# Patient Record
Sex: Male | Born: 1960 | Race: White | Hispanic: No | Marital: Married | Smoking: Former smoker
Health system: Southern US, Community
[De-identification: ages and names within clinical notes are randomized; demographics above are authoritative.]

## PROBLEM LIST (undated history)

## (undated) DIAGNOSIS — K648 Other hemorrhoids: Principal | ICD-10-CM

## (undated) DIAGNOSIS — D126 Benign neoplasm of colon, unspecified: Secondary | ICD-10-CM

## (undated) DIAGNOSIS — G2581 Restless legs syndrome: Secondary | ICD-10-CM

## (undated) DIAGNOSIS — M109 Gout, unspecified: Secondary | ICD-10-CM

## (undated) DIAGNOSIS — N529 Male erectile dysfunction, unspecified: Secondary | ICD-10-CM

## (undated) DIAGNOSIS — R569 Unspecified convulsions: Secondary | ICD-10-CM

## (undated) DIAGNOSIS — K219 Gastro-esophageal reflux disease without esophagitis: Secondary | ICD-10-CM

## (undated) DIAGNOSIS — T7840XA Allergy, unspecified, initial encounter: Secondary | ICD-10-CM

## (undated) DIAGNOSIS — G4730 Sleep apnea, unspecified: Secondary | ICD-10-CM

## (undated) DIAGNOSIS — I10 Essential (primary) hypertension: Secondary | ICD-10-CM

## (undated) DIAGNOSIS — M5136 Other intervertebral disc degeneration, lumbar region: Secondary | ICD-10-CM

## (undated) DIAGNOSIS — F329 Major depressive disorder, single episode, unspecified: Secondary | ICD-10-CM

## (undated) DIAGNOSIS — R0602 Shortness of breath: Secondary | ICD-10-CM

## (undated) DIAGNOSIS — H269 Unspecified cataract: Secondary | ICD-10-CM

## (undated) DIAGNOSIS — F32A Depression, unspecified: Secondary | ICD-10-CM

## (undated) DIAGNOSIS — M51369 Other intervertebral disc degeneration, lumbar region without mention of lumbar back pain or lower extremity pain: Secondary | ICD-10-CM

## (undated) DIAGNOSIS — E119 Type 2 diabetes mellitus without complications: Secondary | ICD-10-CM

## (undated) DIAGNOSIS — F419 Anxiety disorder, unspecified: Secondary | ICD-10-CM

## (undated) HISTORY — DX: Unspecified cataract: H26.9

## (undated) HISTORY — DX: Type 2 diabetes mellitus without complications: E11.9

## (undated) HISTORY — PX: COLONOSCOPY: SHX174

## (undated) HISTORY — PX: OTHER SURGICAL HISTORY: SHX169

## (undated) HISTORY — DX: Unspecified convulsions: R56.9

## (undated) HISTORY — DX: Other hemorrhoids: K64.8

## (undated) HISTORY — DX: Male erectile dysfunction, unspecified: N52.9

## (undated) HISTORY — DX: Gastro-esophageal reflux disease without esophagitis: K21.9

## (undated) HISTORY — PX: EYE SURGERY: SHX253

## (undated) HISTORY — DX: Benign neoplasm of colon, unspecified: D12.6

## (undated) HISTORY — DX: Essential (primary) hypertension: I10

## (undated) HISTORY — DX: Other intervertebral disc degeneration, lumbar region: M51.36

## (undated) HISTORY — DX: Other intervertebral disc degeneration, lumbar region without mention of lumbar back pain or lower extremity pain: M51.369

## (undated) HISTORY — PX: BACK SURGERY: SHX140

## (undated) HISTORY — DX: Anxiety disorder, unspecified: F41.9

## (undated) HISTORY — DX: Allergy, unspecified, initial encounter: T78.40XA

---

## 1996-11-21 HISTORY — PX: CARDIAC CATHETERIZATION: SHX172

## 2003-09-21 ENCOUNTER — Ambulatory Visit (HOSPITAL_COMMUNITY)
Admission: RE | Admit: 2003-09-21 | Discharge: 2003-09-21 | Payer: Self-pay | Admitting: Physical Medicine and Rehabilitation

## 2004-10-22 ENCOUNTER — Emergency Department (HOSPITAL_COMMUNITY): Admission: EM | Admit: 2004-10-22 | Discharge: 2004-10-22 | Payer: Self-pay | Admitting: Emergency Medicine

## 2005-09-06 ENCOUNTER — Ambulatory Visit (HOSPITAL_COMMUNITY)
Admission: RE | Admit: 2005-09-06 | Discharge: 2005-09-06 | Payer: Self-pay | Admitting: Physical Medicine and Rehabilitation

## 2006-11-21 HISTORY — PX: SPINE SURGERY: SHX786

## 2007-07-26 ENCOUNTER — Encounter: Admission: RE | Admit: 2007-07-26 | Discharge: 2007-07-26 | Payer: Self-pay | Admitting: Neurosurgery

## 2007-08-28 ENCOUNTER — Ambulatory Visit: Payer: Self-pay | Admitting: *Deleted

## 2007-08-28 ENCOUNTER — Inpatient Hospital Stay (HOSPITAL_COMMUNITY): Admission: RE | Admit: 2007-08-28 | Discharge: 2007-09-01 | Payer: Self-pay | Admitting: Neurosurgery

## 2008-10-21 ENCOUNTER — Encounter: Admission: RE | Admit: 2008-10-21 | Discharge: 2008-10-21 | Payer: Self-pay | Admitting: Family Medicine

## 2010-08-12 ENCOUNTER — Encounter: Admission: RE | Admit: 2010-08-12 | Discharge: 2010-08-12 | Payer: Self-pay | Admitting: Occupational Medicine

## 2010-10-07 ENCOUNTER — Encounter: Admission: RE | Admit: 2010-10-07 | Discharge: 2010-10-07 | Payer: Self-pay | Admitting: Orthopedic Surgery

## 2010-12-13 ENCOUNTER — Encounter: Payer: Self-pay | Admitting: Neurosurgery

## 2011-04-05 NOTE — Discharge Summary (Signed)
NAME:  THORNE, WIRZ NO.:  [id number]   MEDICAL RECORD NO.:  [id number]          PATIENT TYPE:  INP   LOCATION:  5148                         FACILITY:  [facility]   PHYSICIAN:  Payton Doughty, M.D.      DATE OF BIRTH:  Jun 24, 1961   DATE OF ADMISSION:  08/28/2007  DATE OF DISCHARGE:  09/01/2007                               DISCHARGE SUMMARY   ADMISSION DIAGNOSIS:  Spondylosis, L4-5.   DISCHARGE DIAGNOSIS:  Spondylosis, L4-5.   PROCEDURE:  L4-5 anterior lumbar interbody fusion.   DICTATING:  Payton Doughty, M.D., neurosurgery.   ATTENDING PHYSICIAN:  Danae Orleans. Venetia Maxon, M.D.   COMPLICATIONS:  None.   DISCHARGE STATUS:  Well.   BODY OF TEXT:  This is a 50 year old gentleman whose history and  physical is recounted on the chart.  He has intractable low back pain.  He has had numerous injections.  He continued to have back pain related  to 4-5.  He was admitted by Dr. Venetia Maxon and underwent an anterior lumbar  interbody fusion with a PEEK interbody cage and BMP.  Postoperatively,  he has done well.  He had a small ileus for a day or so which has  resolved.  Now he has had marked resolution of his back pain.  He is  still using a little bit of Percocet for pain.  He is being discharged  home on the fourth day, up and about, wearing his brace, facile with his  activities of daily living.  His follow up will be in the [facility]  off[facility] next week via phone call and a follow up visit per Dr. Venetia Maxon.  He is discharged home on Percocet.           ______________________________  Payton Doughty, M.D.     MWR/MEDQ  D:  09/01/2007  T:  09/02/2007  Job:  [id number]

## 2011-04-05 NOTE — Op Note (Signed)
NAME:  Erik, Holmes NO.:  [id number]   MEDICAL RECORD NO.:  [id number]          PATIENT TYPE:  INP   LOCATION:  3172                         FACILITY:  [facility]   PHYSICIAN:  Balinda Quails, M.D.    DATE OF BIRTH:  1961/09/15   DATE OF PROCEDURE:  08/28/2007  DATE OF DISCHARGE:                               OPERATIVE REPORT   SURGEON:  P Bud Face, MD   Tina GriffithsDanae Orleans. Venetia Maxon, MD.   ANESTHETIC:  General endotracheal.   ANESTHESIOLOGIST:  Maren Beach, M.D.   PREOPERATIVE DIAGNOSIS:  L4-5 degenerative disk disease.   POSTOPERATIVE DIAGNOSIS:  L4-5 degenerative disk disease.   PROCEDURE:  L4-5 anterior lumbar interbody fusion.   CLINICAL NOTE:  Erik Holmes is a 50 year old male with chronic back  pain.  Workup by Dr. Venetia Maxon revealed degenerative L4-5 disk disease.  He  is felt to be a good candidate for anterior lumbar interbody fusion.  Brought to the operating room at this time for planned procedure.  The  patient was seen preoperatively in the holding area, normal physical  examination regarding his vascular tree.  2+ dorsalis pedis pulses  bilaterally.  No history of DVT.   Risks of the operative procedure were explained to the patient, he  understood these.  Risks include but are not limited to major vessel  injury, lower limb ischemia, DVT and pulmonary embolus, major bleeding,  transfusion, infection, hernia, sexual dysfunction with retrograde  ejaculation.  The patient brought to the operating at this time for  planned procedure.   OPERATIVE PROCEDURE:  The patient brought to the operating room in  stable condition.  Placed in supine position.  In the supine position,  the abdomen was prepped and draped in sterile fashion.  Pulse oximeter  placed on the left foot.  This remained 100% throughout the operative  procedure.   Transverse skin incision made in the left lower quadrant in a premarked  site for L4-5.  Subcutaneous tissue  divided.  The anterior left anterior  rectus sheath cleared.  Anterior rectus sheath incised from midline to  lateral margin of rectus muscle.  The superior and inferior flaps were  created of rectus sheath.  The rectus muscle then mobilized bluntly.   The posterior rectus sheath was incised with a 15 blade.  The  retroperitoneum freed up off the posterior rectus sheath and the  retroperitoneal space entered laterally.  Psoas muscle and genitofemoral  nerves were identified, nerve kept on the muscle.  The abdominal  contents along with the ureter and superior sympathetic fibers were then  swept off the left common and external iliac artery.   The L5-S1 disk was identified.  The left common and external iliac  artery were skeletonized bluntly.  Plane created between the lymphatics  and the artery.  The left iliac vessels were retracted to the right.  The left common and external iliac vein were freed bluntly with a  pusher.  The iliolumbar vein ligated between 2-0 silk and divided.  The  left common iliac vein was mobilized.  The L4-5 disk was identified.  The prefascial tissues were scored with Bovie cautery.  Using blunt  dissection the disk was cleared from left-to-right.  There was some  bleeding from the vena cava which was controlled with a figure-of-eight  6-0 Prolene suture.  The L4 segmental vessels were ligated with clips  and divided.  This allowed full mobilization of the vessels to the  right.  The disk was exposed completely.   Using a [employer] retractor, reverse lip blades were then placed on  the lateral margins of the L4-5 disk.  The malleable retractors were  placed superiorly and inferiorly.   This allowed full exposure of the disk which was verified by  fluoroscopy.   Dr. Venetia Maxon then completed L4-5 ALIF.  Following instrumentation, the  retractors were then removed.  There was no significant bleeding.  Sponge and instrument counts correct.   The anterior  rectus sheath was then closed with running 0 Vicryl suture.  Deep subcutaneous layer closed running 2-0 Vicryl suture.  Superficial  subcutaneous layer closed running 2-0 Vicryl suture.  The skin was  closed with Dermabond.  No apparent complications.  The patient  transferred to recovery room in stable condition.      Balinda Quails, M.D.  Electronically Signed     PGH/MEDQ  D:  08/28/2007  T:  08/28/2007  Job:  [id number]

## 2011-04-05 NOTE — Op Note (Signed)
NAME:  Erik Holmes, Erik Holmes NO.:  [id number]   MEDICAL RECORD NO.:  [id number]          PATIENT TYPE:  INP   LOCATION:  3172                         FACILITY:  [facility]   PHYSICIAN:  Danae Orleans. Venetia Maxon, M.D.  DATE OF BIRTH:  June 01, 1961   DATE OF PROCEDURE:  08/28/2007  DATE OF DISCHARGE:                               OPERATIVE REPORT   PREOPERATIVE DIAGNOSIS:  L4-5 herniated disk with degenerative disk  disease, spondylosis, back pain.   POSTOPERATIVE DIAGNOSIS:  L4-5 herniated disk with degenerative disk  disease, spondylosis, back pain.   PROCEDURE:  Anterior lumbar interbody fusion L4-5 with PEEK interbody  cage and with bone morphogenic protein and tricalcium phosphate.   SURGEON:  Danae Orleans. Venetia Maxon, M.D.   ASSISTANT:  Georgiann Cocker, RN; Hewitt Shorts, M.D.; with Dr. Liliane Bade doing approach and he will dictate that portion separately.   ANESTHESIA:  General endotracheal anesthesia.   BLOOD LOSS:  Minimal.   COMPLICATIONS:  None.   DISPOSITION:  Recovery.   INDICATIONS:  Erik Holmes is a 50 year old man with intractable low  back pain with L4-5 disk herniation, degenerative disease and  spondylosis.  He has had prolonged conservative therapy without any  significant relief including multiple intradiskal injections of  steroids.  It was elected to take him to surgery for anterior lumbar  interbody fusion at the L4-5 level.   PROCEDURE:  Erik Holmes was brought to the operating room.  Following  satisfactory uncomplicated induction of general endotracheal anesthesia  and placement of intravenous lines, the patient was placed in supine  position on the [place] table.  His anterior abdomen was then shaved,  prepped and draped in the usual sterile fashion.  Area of planned  incision was infiltrated with lidocaine and prepped and draped and then  approach was performed by Dr. Madilyn Fireman and he will dictate that portion.   After Dr. Madilyn Fireman had obtained access  to the L4-5 interspace, an 18 gauge  spinal needle was placed and intraoperative fluoroscopy confirmed its  positioning both in the midline and at the correct level.  The L4-5  interspace was then incised with 15 blade and disk material was removed  in piecemeal fashion.  Endplates were stripped of residual disk material  and a thorough diskectomy was carried to the posterior longitudinal  ligament and calcified disk material was removed.  The ligament appear  to be intact and easily ballottable.  The lateral recesses were also  decompressed.  Cartilaginous endplates were stripped of cartilage and  using a variety of ring cutting curettes, the thorough diskectomy was  performed with removal of remaining cartilaginous material.  After trial  sizing, it was elected to use a 13.5 mm  by large 8 degrees [employer] ALIF  implant.  This appeared to fit snuggly and could be positioned well on  AP and lateral fluoroscopy.  Subsequently the similarly sized implant  was then packed with extra small kit of bone morphogenic protein along  with tricalcium phosphate granules and autogenous blood.  The implant  was inserted in the interspace and countersunk appropriately.  Four 20  mm screws were placed on the top of the implant in a standard fashion  with good positioning and positioning of the implant and screws were  confirmed on AP and lateral fluoroscopy.  The remaining tricalcium  phosphate was tamped into position laterally and also along the edges of  the diskectomy.  The closure was then turned over to Dr. Madilyn Fireman and he  will dictate this portion as well.      Danae Orleans. Venetia Maxon, M.D.  Electronically Signed     JDS/MEDQ  D:  08/28/2007  T:  08/28/2007  Job:  [id number]

## 2011-09-01 LAB — CBC
HCT: 37.6 — ABNORMAL LOW
HCT: 46.7
Hemoglobin: 12.9 — ABNORMAL LOW
Hemoglobin: 15.9
MCHC: 34
MCHC: 34.2
MCV: 88.4
MCV: 88.7
Platelets: 224
Platelets: 292
RBC: 4.25
RBC: 5.27
RDW: 13.2
RDW: 13.9
WBC: 13.4 — ABNORMAL HIGH
WBC: 8

## 2011-09-01 LAB — ABO/RH: ABO/RH(D): A POS

## 2011-09-01 LAB — DIFFERENTIAL
Basophils Absolute: 0
Basophils Relative: 0
Eosinophils Absolute: 0
Eosinophils Relative: 0
Lymphocytes Relative: 15
Lymphs Abs: 1.9
Monocytes Absolute: 1.7 — ABNORMAL HIGH
Monocytes Relative: 13 — ABNORMAL HIGH
Neutro Abs: 9.7 — ABNORMAL HIGH
Neutrophils Relative %: 72

## 2011-09-01 LAB — BASIC METABOLIC PANEL
BUN: 5 — ABNORMAL LOW
CO2: 30
Calcium: 8 — ABNORMAL LOW
Chloride: 102
Creatinine, Ser: 1.06
GFR calc Af Amer: >60
GFR calc non Af Amer: >60
Glucose, Bld: 126 — ABNORMAL HIGH
Potassium: 4.1
Sodium: 138

## 2011-09-01 LAB — TYPE AND SCREEN
ABO/RH(D): A POS
Antibody Screen: NEGATIVE

## 2011-12-30 ENCOUNTER — Ambulatory Visit (INDEPENDENT_AMBULATORY_CARE_PROVIDER_SITE_OTHER): Payer: 59 | Admitting: Family Medicine

## 2011-12-30 ENCOUNTER — Ambulatory Visit: Payer: 59

## 2011-12-30 VITALS — BP 129/84 | HR 76 | Temp 98.5°F | Resp 16 | Ht 68.5 in | Wt 283.0 lb

## 2011-12-30 DIAGNOSIS — M545 Low back pain, unspecified: Secondary | ICD-10-CM

## 2011-12-30 DIAGNOSIS — M5136 Other intervertebral disc degeneration, lumbar region: Secondary | ICD-10-CM | POA: Insufficient documentation

## 2011-12-30 DIAGNOSIS — M542 Cervicalgia: Secondary | ICD-10-CM

## 2011-12-30 MED ORDER — HYDROCODONE-ACETAMINOPHEN 5-325 MG PO TABS: ORAL_TABLET | ORAL | Status: DC

## 2011-12-30 MED ORDER — MELOXICAM 15 MG PO TABS: 15.0000 mg | ORAL_TABLET | Freq: Every day | ORAL | Status: DC

## 2011-12-30 MED ORDER — CYCLOBENZAPRINE HCL 10 MG PO TABS: 10.0000 mg | ORAL_TABLET | Freq: Three times a day (TID) | ORAL | Status: AC | PRN
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
## 2011-12-30 NOTE — Patient Instructions (Signed)
Apply warm compresses to your neck and low back 2-3 times daily.  Gentle range of motion of the neck and low back after heat application.

## 2011-12-31 NOTE — Progress Notes (Signed)
  Subjective:    Patient ID: Erik Holmes, male    DOB: [birth year], 51 y.o.   MRN: [id number]  HPI    Review of Systems     Objective:   Physical Exam  Xray was interpreted and case discussed with Chelle and agree.        Assessment & Plan:

## 2012-01-26 ENCOUNTER — Encounter (HOSPITAL_COMMUNITY): Payer: Self-pay

## 2012-01-26 ENCOUNTER — Emergency Department (HOSPITAL_COMMUNITY): Payer: 59

## 2012-01-26 ENCOUNTER — Other Ambulatory Visit: Payer: Self-pay

## 2012-01-26 ENCOUNTER — Emergency Department (HOSPITAL_COMMUNITY)
Admission: EM | Admit: 2012-01-26 | Discharge: 2012-01-26 | Disposition: A | Discharge status: Home Health | Payer: 59 | Attending: Emergency Medicine | Admitting: Emergency Medicine

## 2012-01-26 DIAGNOSIS — R110 Nausea: Secondary | ICD-10-CM | POA: Insufficient documentation

## 2012-01-26 DIAGNOSIS — R718 Other abnormality of red blood cells: Secondary | ICD-10-CM

## 2012-01-26 DIAGNOSIS — R197 Diarrhea, unspecified: Secondary | ICD-10-CM | POA: Insufficient documentation

## 2012-01-26 DIAGNOSIS — R42 Dizziness and giddiness: Secondary | ICD-10-CM | POA: Insufficient documentation

## 2012-01-26 DIAGNOSIS — R1013 Epigastric pain: Secondary | ICD-10-CM | POA: Insufficient documentation

## 2012-01-26 DIAGNOSIS — R0602 Shortness of breath: Secondary | ICD-10-CM | POA: Insufficient documentation

## 2012-01-26 DIAGNOSIS — E860 Dehydration: Secondary | ICD-10-CM | POA: Insufficient documentation

## 2012-01-26 DIAGNOSIS — R21 Rash and other nonspecific skin eruption: Secondary | ICD-10-CM | POA: Insufficient documentation

## 2012-01-26 LAB — URINALYSIS, ROUTINE W REFLEX MICROSCOPIC
Glucose, UA: NEGATIVE mg/dL
Hgb urine dipstick: NEGATIVE
Specific Gravity, Urine: 1.009 (ref 1.005–1.030)
Urobilinogen, UA: 0.2 mg/dL (ref 0.0–1.0)
pH: 6 (ref 5.0–8.0)

## 2012-01-26 LAB — COMPREHENSIVE METABOLIC PANEL
AST: 17 U/L (ref 0–37)
Albumin: 3.7 g/dL (ref 3.5–5.2)
Calcium: 9 mg/dL (ref 8.4–10.5)
Creatinine, Ser: 0.9 mg/dL (ref 0.50–1.35)
GFR calc non Af Amer: >90 mL/min (ref >90)
Sodium: 138 mEq/L (ref 135–145)
Total Protein: 6.8 g/dL (ref 6.0–8.3)

## 2012-01-26 LAB — CBC
HCT: 54.4 % — ABNORMAL HIGH (ref 39.0–52.0)
MCHC: 35.5 g/dL (ref 30.0–36.0)
MCV: 88.5 fL (ref 78.0–100.0)
Platelets: 285 10*3/uL (ref 150–400)
RDW: 13.9 % (ref 11.5–15.5)

## 2012-01-26 LAB — POCT I-STAT TROPONIN I: Troponin i, poc: 0 ng/mL (ref 0.00–0.08)

## 2012-01-26 LAB — DIFFERENTIAL
Basophils Absolute: 0 10*3/uL (ref 0.0–0.1)
Basophils Relative: 0 % (ref 0–1)
Eosinophils Relative: 1 % (ref 0–5)
Monocytes Absolute: 0.6 10*3/uL (ref 0.1–1.0)

## 2012-01-26 LAB — LIPASE, BLOOD: Lipase: 12 U/L (ref 11–59)

## 2012-01-26 MED ORDER — DIPHENHYDRAMINE HCL 50 MG/ML IJ SOLN
25.0000 mg | Freq: Once | INTRAMUSCULAR | Status: AC
  Administered 2012-01-26: 25 mg via INTRAVENOUS
  Filled 2012-01-26 (×2): qty 1

## 2012-01-26 MED ORDER — ONDANSETRON 8 MG PO TBDP: 8.0000 mg | ORAL_TABLET | Freq: Three times a day (TID) | ORAL | Status: AC | PRN

## 2012-01-26 MED ORDER — SODIUM CHLORIDE 0.9 % IV SOLN: 1000.0000 mL | INTRAVENOUS | Status: DC

## 2012-01-26 MED ORDER — SODIUM CHLORIDE 0.9 % IV SOLN
1000.0000 mL | Freq: Once | INTRAVENOUS | Status: AC
  Administered 2012-01-26: 1000 mL via INTRAVENOUS

## 2012-01-26 MED ORDER — ONDANSETRON HCL 4 MG/2ML IJ SOLN
4.0000 mg | Freq: Once | INTRAMUSCULAR | Status: AC
  Administered 2012-01-26: 4 mg via INTRAVENOUS
  Filled 2012-01-26 (×2): qty 2

## 2012-01-26 NOTE — ED Provider Notes (Signed)
History     CSN: [id number]  Arrival date & time 01/26/12  [id number]   First MD Initiated Contact with Patient 01/26/12 [phone]      Chief Complaint  Patient presents with  . Shortness of Breath  . Nausea  . Abdominal Pain    HPI The patient was then sent to the emergency room after an episode of nausea and lightheadedness. Patient states he went to the restroom to have a bowel movement. He he had diarrhea with that and began feeling very lightheaded and dizzy. Patient also states at that time he felt somewhat short of breath.  911 was called because he was feeling very lightheaded.He is having some mild to moderate abdominal discomfort. Patient denies any fevers, cough or chest pain. He does not have any difficulty with his breathing at this time. Patient states that he also noticed that he had a diffuse red rash. He described discomfort as a 6/10. It seems to be improving. Past Medical History  Diagnosis Date  . Degenerative disc disease, lumbar   . Anxiety     Past Surgical History  Procedure Date  . Spine surgery 2008    lumbar fusion    History reviewed. No pertinent family history.  History  Substance Use Topics  . Smoking status: Current Everyday Smoker -- 2.0 packs/day  . Smokeless tobacco: Not on file  . Alcohol Use: Yes      Review of Systems  All other systems reviewed and are negative.    Allergies  Codeine and Other  Home Medications   Current Outpatient Rx  Name Route Sig Dispense Refill  . OXYCODONE-ACETAMINOPHEN 10-325 MG PO TABS Oral Take 1 tablet by mouth every 4 (four) hours as needed. For pain    . VENLAFAXINE HCL ER 75 MG PO CP24 Oral Take 225 mg by mouth daily.      BP 140/85  Pulse 83  Temp(Src) 97.6 F (36.4 C) (Oral)  Resp 22  SpO2 95%  Physical Exam  Nursing note and vitals reviewed. Constitutional: He appears well-developed and well-nourished. No distress.  HENT:  Head: Normocephalic and atraumatic.  Right Ear: External ear normal.   Left Ear: External ear normal.  Eyes: Conjunctivae are normal. Right eye exhibits no discharge. Left eye exhibits no discharge. No scleral icterus.  Neck: Neck supple. No tracheal deviation present.  Cardiovascular: Normal rate, regular rhythm and intact distal pulses.   Pulmonary/Chest: Effort normal and breath sounds normal. No stridor. No respiratory distress. He has no wheezes. He has no rales.  Abdominal: Soft. Bowel sounds are normal. He exhibits no distension. There is tenderness (mild epigastric tenderness). There is no rebound and no guarding.  Musculoskeletal: He exhibits no edema and no tenderness.  Neurological: He is alert. He has normal strength. No sensory deficit. Cranial nerve deficit:  no gross defecits noted. He exhibits normal muscle tone. He displays no seizure activity. Coordination normal.  Skin: Skin is warm and dry. No rash noted. Erythema: diffuse erythema of the upper extremities and torso.  Psychiatric: He has a normal mood and affect.    ED Course  Procedures (including critical care time)  Date: 01/26/2012  Rate: 87  Rhythm: normal sinus rhythm  QRS Axis: normal  Intervals: normal  ST/T Wave abnormalities: normal  Conduction Disutrbances:none  Narrative Interpretation:   Old EKG Reviewed: none available   Labs Reviewed  CBC - Abnormal; Notable for the following:    RBC 6.15 (*)    Hemoglobin 19.3 (*)  HCT 54.4 (*)    All other components within normal limits  COMPREHENSIVE METABOLIC PANEL - Abnormal; Notable for the following:    Glucose, Bld 152 (*)    All other components within normal limits  DIFFERENTIAL  URINALYSIS, ROUTINE W REFLEX MICROSCOPIC  LIPASE, BLOOD  POCT I-STAT TROPONIN I   Dg Abd Acute W/chest  01/26/2012  *RADIOLOGY REPORT*  Clinical Data: 51 year old male with nausea vomiting and abdominal pain.  Shortness of breath.  ACUTE ABDOMEN SERIES (ABDOMEN 2 VIEW & CHEST 1 VIEW)  Comparison: CT lumbar spine 07/26/2007.  Findings: Low  lung volumes.  Crowding of lung markings at the bases.  No pneumothorax or pneumoperitoneum.  Cardiac size and mediastinal contours are within normal limits.  Visualized tracheal air column is within normal limits.  Mild paucity of bowel gas but overall nonobstructed pattern. Abdominal and pelvic visceral contours are within normal limits. Lower lumbar fusion sequelae. No acute osseous abnormality identified.  IMPRESSION:  1. Nonobstructed bowel gas pattern, no free air. 2. Low lung volumes, otherwise no acute cardiopulmonary abnormality.  Original Report Authenticated By: Harley Hallmark, M.D.       MDM  Patient is feeling better at this time. He is no longer lightheaded. He has not had any nausea vomiting or diarrhea in the emergency department.  The patient's laboratory tests show elevation in his red blood cell count. He denies any history of hemochromatosis. It is possible this could be related to dehydration. The patient has been given IV fluids. I have explained the results to him and will have him followup with his doctor to have that rechecked.  At this time I doubt acute cardiac abnormality. There is no sign of acute infection. Patient be discharged home with supportive treatment.        Celene Kras, MD 01/26/12 [phone]

## 2012-01-26 NOTE — ED Notes (Signed)
Patient was at work when he had onset of lower abdominal pain, shortness of breath, and nausea. He felt like he needed to have a bowel movement so he had some diarrhea prior to ems arrival. ems thought that maybe he had had a vagal response because he was dizzy and short of breath after having a bm. Upon arrival pt is extremely red and mildly short of breath. He states that he was recently on abx for sinus infection but stopped taking it because he felt better. He states that for the past couple of days he thought that it was returning so he took the abx again. He denies any food or drug allergies besides codeine. Pt is alert and oriented. Mildly short of breath. No meds pta.

## 2012-01-26 NOTE — ED Notes (Signed)
Pt d/c home in NAD. Pt voiced understanding of d/c instructions.  

## 2012-01-26 NOTE — Discharge Instructions (Signed)
Dehydration, Adult Dehydration is when you lose more fluids from the body than you take in. Vital organs like the kidneys, brain, and heart cannot function without a proper amount of fluids and salt. Any loss of fluids from the body can cause dehydration.  CAUSES   Vomiting.   Diarrhea.   Excessive sweating.   Excessive urine output.   Fever.  SYMPTOMS  Mild dehydration  Thirst.   Dry lips.   Slightly dry mouth.  Moderate dehydration  Very dry mouth.   Sunken eyes.   Skin does not bounce back quickly when lightly pinched and released.   Dark urine and decreased urine production.   Decreased tear production.   Headache.  Severe dehydration  Very dry mouth.   Extreme thirst.   Rapid, weak pulse (more than 100 beats per minute at rest).   Cold hands and feet.   Not able to sweat in spite of heat and temperature.   Rapid breathing.   Blue lips.   Confusion and lethargy.   Difficulty being awakened.   Minimal urine production.   No tears.  DIAGNOSIS  Your caregiver will diagnose dehydration based on your symptoms and your exam. Blood and urine tests will help confirm the diagnosis. The diagnostic evaluation should also identify the cause of dehydration. TREATMENT  Treatment of mild or moderate dehydration can often be done at home by increasing the amount of fluids that you drink. It is best to drink small amounts of fluid more often. Drinking too much at one time can make vomiting worse. Refer to the home care instructions below. Severe dehydration needs to be treated at the hospital where you will probably be given intravenous (IV) fluids that contain water and electrolytes. HOME CARE INSTRUCTIONS   Ask your caregiver about specific rehydration instructions.   Drink enough fluids to keep your urine clear or pale yellow.   Drink small amounts frequently if you have nausea and vomiting.   Eat as you normally do.   Avoid:   Foods or drinks high in  sugar.   Carbonated drinks.   Juice.   Extremely hot or cold fluids.   Drinks with caffeine.   Fatty, greasy foods.   Alcohol.   Tobacco.   Overeating.   Gelatin desserts.   Wash your hands well to avoid spreading bacteria and viruses.   Only take over-the-counter or prescription medicines for pain, discomfort, or fever as directed by your caregiver.   Ask your caregiver if you should continue all prescribed and over-the-counter medicines.   Keep all follow-up appointments with your caregiver.  SEEK MEDICAL CARE IF:  You have abdominal pain and it increases or stays in one area (localizes).   You have a rash, stiff neck, or severe headache.   You are irritable, sleepy, or difficult to awaken.   You are weak, dizzy, or extremely thirsty.  SEEK IMMEDIATE MEDICAL CARE IF:   You are unable to keep fluids down or you get worse despite treatment.   You have frequent episodes of vomiting or diarrhea.   You have blood or green matter (bile) in your vomit.   You have blood in your stool or your stool looks black and tarry.   You have not urinated in 6 to 8 hours, or you have only urinated a small amount of very dark urine.   You have a fever.   You faint.  MAKE SURE YOU:   Understand these instructions.   Will watch your condition.     Will get help right away if you are not doing well or get worse.  Document Released: 11/07/2005 Document Revised: 10/27/2011 Document Reviewed: 06/27/2011 [employer] Patient Information 2012 [employer]. 

## 2012-01-26 NOTE — ED Notes (Signed)
Family at the bedside.

## 2013-02-13 ENCOUNTER — Other Ambulatory Visit: Payer: Self-pay | Admitting: Neurosurgery

## 2013-04-22 ENCOUNTER — Encounter (HOSPITAL_COMMUNITY): Payer: Self-pay

## 2013-04-22 ENCOUNTER — Encounter (HOSPITAL_COMMUNITY)
Admission: RE | Admit: 2013-04-22 | Discharge: 2013-04-22 | Disposition: A | Discharge status: Home / Self Care | Payer: 59 | Source: Ambulatory Visit | Attending: Neurosurgery | Admitting: Neurosurgery

## 2013-04-22 ENCOUNTER — Ambulatory Visit (HOSPITAL_COMMUNITY)
Admission: RE | Admit: 2013-04-22 | Discharge: 2013-04-22 | Disposition: A | Discharge status: Home / Self Care | Payer: 59 | Source: Ambulatory Visit | Attending: Neurosurgery | Admitting: Neurosurgery

## 2013-04-22 DIAGNOSIS — Z01818 Encounter for other preprocedural examination: Secondary | ICD-10-CM | POA: Insufficient documentation

## 2013-04-22 DIAGNOSIS — Z01810 Encounter for preprocedural cardiovascular examination: Secondary | ICD-10-CM | POA: Insufficient documentation

## 2013-04-22 DIAGNOSIS — Z01812 Encounter for preprocedural laboratory examination: Secondary | ICD-10-CM | POA: Insufficient documentation

## 2013-04-22 HISTORY — DX: Major depressive disorder, single episode, unspecified: F32.9

## 2013-04-22 HISTORY — DX: Restless legs syndrome: G25.81

## 2013-04-22 HISTORY — DX: Shortness of breath: R06.02

## 2013-04-22 HISTORY — DX: Gout, unspecified: M10.9

## 2013-04-22 HISTORY — DX: Depression, unspecified: F32.A

## 2013-04-22 HISTORY — DX: Sleep apnea, unspecified: G47.30

## 2013-04-22 LAB — BASIC METABOLIC PANEL
BUN: 11 mg/dL (ref 6–23)
Chloride: 102 mEq/L (ref 96–112)
GFR calc non Af Amer: >90 mL/min (ref >90)
Glucose, Bld: 121 mg/dL — ABNORMAL HIGH (ref 70–99)
Potassium: 4 mEq/L (ref 3.5–5.1)
Sodium: 140 mEq/L (ref 135–145)

## 2013-04-22 LAB — SURGICAL PCR SCREEN: Staphylococcus aureus: NEGATIVE

## 2013-04-22 LAB — CBC
HCT: 47.9 % (ref 39.0–52.0)
Hemoglobin: 17.2 g/dL — ABNORMAL HIGH (ref 13.0–17.0)
RBC: 5.4 MIL/uL (ref 4.22–5.81)
WBC: 9.5 10*3/uL (ref 4.0–10.5)

## 2013-04-22 NOTE — Pre-Procedure Instructions (Signed)
Simpson Paulos Finnigan  04/22/2013   Your procedure is scheduled on:  04/30/13  Report to [facility] at 530 AM.  Call this number if you have problems the morning of surgery: [phone]   Remember:   Do not eat food or drink liquids after midnight.   Take these medicines the morning of surgery with A SIP OF WATER: oxycodone,effxor   Do not wear jewelry, make-up or nail polish.  Do not wear lotions, powders, or perfumes. You may wear deodorant.  Do not shave 48 hours prior to surgery. Men may shave face and neck.  Do not bring valuables to the hospital.  [facility] is not responsible                   for any belongings or valuables.  Contacts, dentures or bridgework may not be worn into surgery.  Leave suitcase in the car. After surgery it may be brought to your room.  For patients admitted to the hospital, checkout time is 11:00 AM the day of  discharge.   Patients discharged the day of surgery will not be allowed to drive  home.  Name and phone number of your driver: family  Special Instructions: Shower using CHG 2 nights before surgery and the night before surgery.  If you shower the day of surgery use CHG.  Use special wash - you have one bottle of CHG for all showers.  You should use approximately 1/3 of the bottle for each shower.   Please read over the following fact sheets that you were given: Pain Booklet, Coughing and Deep Breathing, Blood Transfusion Information, MRSA Information and Surgical Site Infection Prevention

## 2013-04-30 ENCOUNTER — Ambulatory Visit (HOSPITAL_COMMUNITY): Payer: 59 | Admitting: Certified Registered"

## 2013-04-30 ENCOUNTER — Encounter (HOSPITAL_COMMUNITY): Payer: Self-pay | Admitting: Certified Registered"

## 2013-04-30 ENCOUNTER — Encounter (HOSPITAL_COMMUNITY)
Admission: RE | Disposition: A | Discharge status: Home / Self Care | Payer: Self-pay | Source: Ambulatory Visit | Attending: Neurosurgery

## 2013-04-30 ENCOUNTER — Encounter (HOSPITAL_COMMUNITY): Payer: Self-pay | Admitting: *Deleted

## 2013-04-30 ENCOUNTER — Observation Stay (HOSPITAL_COMMUNITY)
Admission: RE | Admit: 2013-04-30 | Discharge: 2013-05-01 | Disposition: A | Discharge status: Home / Self Care | Payer: 59 | Source: Ambulatory Visit | Attending: Neurosurgery | Admitting: Neurosurgery

## 2013-04-30 ENCOUNTER — Ambulatory Visit (HOSPITAL_COMMUNITY): Payer: 59

## 2013-04-30 DIAGNOSIS — F17200 Nicotine dependence, unspecified, uncomplicated: Secondary | ICD-10-CM | POA: Insufficient documentation

## 2013-04-30 DIAGNOSIS — Q762 Congenital spondylolisthesis: Secondary | ICD-10-CM | POA: Insufficient documentation

## 2013-04-30 DIAGNOSIS — M5000 Cervical disc disorder with myelopathy, unspecified cervical region: Secondary | ICD-10-CM | POA: Insufficient documentation

## 2013-04-30 DIAGNOSIS — Z981 Arthrodesis status: Secondary | ICD-10-CM | POA: Insufficient documentation

## 2013-04-30 DIAGNOSIS — M4716 Other spondylosis with myelopathy, lumbar region: Principal | ICD-10-CM | POA: Insufficient documentation

## 2013-04-30 HISTORY — PX: LUMBAR PERCUTANEOUS PEDICLE SCREW 1 LEVEL: SHX5560

## 2013-04-30 HISTORY — PX: ANTERIOR LAT LUMBAR FUSION: SHX1168

## 2013-04-30 SURGERY — ANTERIOR LATERAL LUMBAR FUSION 1 LEVEL
Anesthesia: General | Site: Flank | Wound class: Clean

## 2013-04-30 MED ORDER — FLEET ENEMA 7-19 GM/118ML RE ENEM
1.0000 | ENEMA | Freq: Once | RECTAL | Status: AC | PRN
  Filled 2013-04-30: qty 1

## 2013-04-30 MED ORDER — DEXAMETHASONE SODIUM PHOSPHATE 10 MG/ML IJ SOLN
INTRAMUSCULAR | Status: DC | PRN
  Administered 2013-04-30: 10 mg via INTRAVENOUS

## 2013-04-30 MED ORDER — MENTHOL 3 MG MT LOZG: 1.0000 | LOZENGE | OROMUCOSAL | Status: DC | PRN

## 2013-04-30 MED ORDER — ZOLPIDEM TARTRATE 5 MG PO TABS: 5.0000 mg | ORAL_TABLET | Freq: Every evening | ORAL | Status: DC | PRN

## 2013-04-30 MED ORDER — ALUM & MAG HYDROXIDE-SIMETH 200-200-20 MG/5ML PO SUSP: 30.0000 mL | Freq: Four times a day (QID) | ORAL | Status: DC | PRN

## 2013-04-30 MED ORDER — ONDANSETRON HCL 4 MG/2ML IJ SOLN: 4.0000 mg | INTRAMUSCULAR | Status: DC | PRN

## 2013-04-30 MED ORDER — HYDROMORPHONE HCL PF 1 MG/ML IJ SOLN
INTRAMUSCULAR | Status: AC
  Filled 2013-04-30: qty 1

## 2013-04-30 MED ORDER — OXYCODONE-ACETAMINOPHEN 5-325 MG PO TABS
1.0000 | ORAL_TABLET | ORAL | Status: DC | PRN
  Administered 2013-04-30: 2 via ORAL
  Filled 2013-04-30: qty 2

## 2013-04-30 MED ORDER — MIDAZOLAM HCL 2 MG/2ML IJ SOLN: 0.5000 mg | Freq: Once | INTRAMUSCULAR | Status: DC | PRN

## 2013-04-30 MED ORDER — DOCUSATE SODIUM 100 MG PO CAPS
100.0000 mg | ORAL_CAPSULE | Freq: Two times a day (BID) | ORAL | Status: DC
  Administered 2013-04-30 – 2013-05-01 (×2): 100 mg via ORAL
  Filled 2013-04-30 (×2): qty 1

## 2013-04-30 MED ORDER — LIDOCAINE-EPINEPHRINE 1 %-1:100000 IJ SOLN
INTRAMUSCULAR | Status: DC | PRN
  Administered 2013-04-30: 4.5 mL

## 2013-04-30 MED ORDER — MEPERIDINE HCL 25 MG/ML IJ SOLN: 6.2500 mg | INTRAMUSCULAR | Status: DC | PRN

## 2013-04-30 MED ORDER — PROPOFOL INFUSION 10 MG/ML OPTIME
INTRAVENOUS | Status: DC | PRN
  Administered 2013-04-30: 50 ug/kg/min via INTRAVENOUS

## 2013-04-30 MED ORDER — INDOMETHACIN 50 MG PO CAPS
50.0000 mg | ORAL_CAPSULE | Freq: Two times a day (BID) | ORAL | Status: DC | PRN
  Filled 2013-04-30: qty 1

## 2013-04-30 MED ORDER — SENNA 8.6 MG PO TABS
1.0000 | ORAL_TABLET | Freq: Two times a day (BID) | ORAL | Status: DC
  Administered 2013-04-30: 8.6 mg via ORAL
  Filled 2013-04-30 (×3): qty 1

## 2013-04-30 MED ORDER — DIAZEPAM 5 MG PO TABS
5.0000 mg | ORAL_TABLET | Freq: Two times a day (BID) | ORAL | Status: DC
  Administered 2013-04-30: 5 mg via ORAL
  Filled 2013-04-30: qty 1

## 2013-04-30 MED ORDER — ACETAMINOPHEN 650 MG RE SUPP: 650.0000 mg | RECTAL | Status: DC | PRN

## 2013-04-30 MED ORDER — OXYCODONE HCL 5 MG PO TABS
ORAL_TABLET | ORAL | Status: AC
  Filled 2013-04-30: qty 1

## 2013-04-30 MED ORDER — GLYCOPYRROLATE 0.2 MG/ML IJ SOLN
INTRAMUSCULAR | Status: DC | PRN
  Administered 2013-04-30: .8 mg via INTRAVENOUS

## 2013-04-30 MED ORDER — SUCCINYLCHOLINE CHLORIDE 20 MG/ML IJ SOLN
INTRAMUSCULAR | Status: DC | PRN
  Administered 2013-04-30: 120 mg via INTRAVENOUS

## 2013-04-30 MED ORDER — ACETAMINOPHEN 325 MG PO TABS: 650.0000 mg | ORAL_TABLET | ORAL | Status: DC | PRN

## 2013-04-30 MED ORDER — MORPHINE SULFATE 2 MG/ML IJ SOLN
1.0000 mg | INTRAMUSCULAR | Status: DC | PRN
  Administered 2013-04-30 – 2013-05-01 (×3): 4 mg via INTRAVENOUS
  Filled 2013-04-30 (×3): qty 2

## 2013-04-30 MED ORDER — PROPOFOL 10 MG/ML IV BOLUS
INTRAVENOUS | Status: DC | PRN
  Administered 2013-04-30: 200 mg via INTRAVENOUS

## 2013-04-30 MED ORDER — 0.9 % SODIUM CHLORIDE (POUR BTL) OPTIME
TOPICAL | Status: DC | PRN
  Administered 2013-04-30: 1000 mL

## 2013-04-30 MED ORDER — PROMETHAZINE HCL 25 MG/ML IJ SOLN: 6.2500 mg | INTRAMUSCULAR | Status: DC | PRN

## 2013-04-30 MED ORDER — BISACODYL 10 MG RE SUPP: 10.0000 mg | Freq: Every day | RECTAL | Status: DC | PRN

## 2013-04-30 MED ORDER — ONDANSETRON HCL 4 MG/2ML IJ SOLN
INTRAMUSCULAR | Status: DC | PRN
  Administered 2013-04-30: 4 mg via INTRAVENOUS

## 2013-04-30 MED ORDER — DIAZEPAM 5 MG PO TABS
5.0000 mg | ORAL_TABLET | Freq: Four times a day (QID) | ORAL | Status: DC | PRN
  Administered 2013-04-30: 5 mg via ORAL

## 2013-04-30 MED ORDER — BUPIVACAINE HCL (PF) 0.5 % IJ SOLN
INTRAMUSCULAR | Status: DC | PRN
  Administered 2013-04-30: 4.5 mL

## 2013-04-30 MED ORDER — PHENOL 1.4 % MT LIQD: 1.0000 | OROMUCOSAL | Status: DC | PRN

## 2013-04-30 MED ORDER — SODIUM CHLORIDE 0.9 % IV SOLN: INTRAVENOUS | Status: DC

## 2013-04-30 MED ORDER — PANTOPRAZOLE SODIUM 40 MG IV SOLR
40.0000 mg | Freq: Every day | INTRAVENOUS | Status: DC
  Administered 2013-04-30: 40 mg via INTRAVENOUS
  Filled 2013-04-30 (×2): qty 40

## 2013-04-30 MED ORDER — LIDOCAINE HCL 4 % MT SOLN
OROMUCOSAL | Status: DC | PRN
  Administered 2013-04-30: 4 mL via TOPICAL

## 2013-04-30 MED ORDER — VANCOMYCIN HCL IN DEXTROSE 1-5 GM/200ML-% IV SOLN
INTRAVENOUS | Status: AC
  Administered 2013-04-30: 1000 mg via INTRAVENOUS
  Filled 2013-04-30: qty 200

## 2013-04-30 MED ORDER — SODIUM CHLORIDE 0.9 % IJ SOLN
3.0000 mL | Freq: Two times a day (BID) | INTRAMUSCULAR | Status: DC
  Administered 2013-04-30 (×2): 3 mL via INTRAVENOUS

## 2013-04-30 MED ORDER — SODIUM CHLORIDE 0.9 % IJ SOLN: 3.0000 mL | INTRAMUSCULAR | Status: DC | PRN

## 2013-04-30 MED ORDER — NEOSTIGMINE METHYLSULFATE 1 MG/ML IJ SOLN
INTRAMUSCULAR | Status: DC | PRN
  Administered 2013-04-30: 5 mg via INTRAVENOUS

## 2013-04-30 MED ORDER — KCL IN DEXTROSE-NACL 20-5-0.45 MEQ/L-%-% IV SOLN
INTRAVENOUS | Status: DC
  Administered 2013-04-30: 14:00:00 via INTRAVENOUS
  Filled 2013-04-30 (×5): qty 1000

## 2013-04-30 MED ORDER — HYDROCODONE-ACETAMINOPHEN 5-325 MG PO TABS: 1.0000 | ORAL_TABLET | ORAL | Status: DC | PRN

## 2013-04-30 MED ORDER — OXYCODONE HCL 5 MG/5ML PO SOLN: 5.0000 mg | Freq: Once | ORAL | Status: AC | PRN

## 2013-04-30 MED ORDER — FENTANYL CITRATE 0.05 MG/ML IJ SOLN
INTRAMUSCULAR | Status: DC | PRN
  Administered 2013-04-30 (×4): 50 ug via INTRAVENOUS
  Administered 2013-04-30: 250 ug via INTRAVENOUS
  Administered 2013-04-30 (×2): 50 ug via INTRAVENOUS

## 2013-04-30 MED ORDER — ALBUTEROL SULFATE HFA 108 (90 BASE) MCG/ACT IN AERS
INHALATION_SPRAY | RESPIRATORY_TRACT | Status: DC | PRN
  Administered 2013-04-30: 2 via RESPIRATORY_TRACT

## 2013-04-30 MED ORDER — OXYCODONE HCL 5 MG PO TABS
10.0000 mg | ORAL_TABLET | Freq: Four times a day (QID) | ORAL | Status: DC
  Administered 2013-04-30: 10 mg via ORAL
  Filled 2013-04-30: qty 2

## 2013-04-30 MED ORDER — LACTATED RINGERS IV SOLN
INTRAVENOUS | Status: DC | PRN
  Administered 2013-04-30 (×2): via INTRAVENOUS

## 2013-04-30 MED ORDER — OXYCODONE HCL 5 MG PO TABS
5.0000 mg | ORAL_TABLET | Freq: Once | ORAL | Status: AC | PRN
  Administered 2013-04-30: 5 mg via ORAL

## 2013-04-30 MED ORDER — POLYETHYLENE GLYCOL 3350 17 G PO PACK
17.0000 g | PACK | Freq: Every day | ORAL | Status: DC | PRN
  Filled 2013-04-30: qty 1

## 2013-04-30 MED ORDER — OXYCODONE HCL 10 MG PO TABS: 10.0000 mg | ORAL_TABLET | Freq: Four times a day (QID) | ORAL | Status: DC

## 2013-04-30 MED ORDER — HYDROMORPHONE HCL PF 1 MG/ML IJ SOLN
0.2500 mg | INTRAMUSCULAR | Status: DC | PRN
  Administered 2013-04-30 (×4): 0.5 mg via INTRAVENOUS

## 2013-04-30 MED ORDER — ROCURONIUM BROMIDE 100 MG/10ML IV SOLN
INTRAVENOUS | Status: DC | PRN
  Administered 2013-04-30: 20 mg via INTRAVENOUS
  Administered 2013-04-30: 30 mg via INTRAVENOUS

## 2013-04-30 MED ORDER — VENLAFAXINE HCL ER 150 MG PO CP24
225.0000 mg | ORAL_CAPSULE | Freq: Every day | ORAL | Status: DC
Start: 2013-05-01 — End: 2013-05-01
  Filled 2013-04-30: qty 1

## 2013-04-30 MED ORDER — LIDOCAINE HCL (CARDIAC) 20 MG/ML IV SOLN
INTRAVENOUS | Status: DC | PRN
  Administered 2013-04-30: 60 mg via INTRAVENOUS

## 2013-04-30 MED ORDER — DIAZEPAM 5 MG PO TABS
ORAL_TABLET | ORAL | Status: AC
  Filled 2013-04-30: qty 1

## 2013-04-30 SURGICAL SUPPLY — 85 items
ADH SKN CLS APL DERMABOND .7 (GAUZE/BANDAGES/DRESSINGS)
ADH SKN CLS LQ APL DERMABOND (GAUZE/BANDAGES/DRESSINGS) ×6
APL SKNCLS STERI-STRIP NONHPOA (GAUZE/BANDAGES/DRESSINGS)
BAG DECANTER FOR FLEXI CONT (MISCELLANEOUS) ×2 IMPLANT
BENZOIN TINCTURE PRP APPL 2/3 (GAUZE/BANDAGES/DRESSINGS) ×2 IMPLANT
BLADE SURG ROTATE 9660 (MISCELLANEOUS) ×3 IMPLANT
BONE VOID FILLER STRIP 10CC (Bone Implant) ×3 IMPLANT
CLOTH BEACON ORANGE TIMEOUT ST (SAFETY) ×4 IMPLANT
CONT SPEC 4OZ CLIKSEAL STRL BL (MISCELLANEOUS) ×4 IMPLANT
COVER BACK TABLE 24X17X13 BIG (DRAPES) ×2 IMPLANT
COVER TABLE BACK 60X90 (DRAPES) ×2 IMPLANT
DERMABOND ADHESIVE PROPEN (GAUZE/BANDAGES/DRESSINGS) ×3
DERMABOND ADVANCED (GAUZE/BANDAGES/DRESSINGS)
DERMABOND ADVANCED .7 DNX12 (GAUZE/BANDAGES/DRESSINGS) ×4 IMPLANT
DERMABOND ADVANCED .7 DNX6 (GAUZE/BANDAGES/DRESSINGS) IMPLANT
DRAPE C-ARM 42X72 X-RAY (DRAPES) ×6 IMPLANT
DRAPE C-ARMOR (DRAPES) ×6 IMPLANT
DRAPE LAPAROTOMY 100X72X124 (DRAPES) ×6 IMPLANT
DRAPE POUCH INSTRU U-SHP 10X18 (DRAPES) ×6 IMPLANT
DRAPE SURG 17X23 STRL (DRAPES) ×3 IMPLANT
DRESSING TELFA 8X3 (GAUZE/BANDAGES/DRESSINGS) ×1 IMPLANT
DURAPREP 26ML APPLICATOR (WOUND CARE) ×6 IMPLANT
ELECT REM PT RETURN 9FT ADLT (ELECTROSURGICAL) ×6
ELECTRODE REM PT RTRN 9FT ADLT (ELECTROSURGICAL) ×4 IMPLANT
GAUZE SPONGE 4X4 16PLY XRAY LF (GAUZE/BANDAGES/DRESSINGS) IMPLANT
GLOVE BIO SURGEON STRL SZ8 (GLOVE) ×9 IMPLANT
GLOVE BIOGEL PI IND STRL 7.0 (GLOVE) ×4 IMPLANT
GLOVE BIOGEL PI IND STRL 8 (GLOVE) ×4 IMPLANT
GLOVE BIOGEL PI IND STRL 8.5 (GLOVE) ×6 IMPLANT
GLOVE BIOGEL PI INDICATOR 7.0 (GLOVE) ×2
GLOVE BIOGEL PI INDICATOR 8 (GLOVE) ×2
GLOVE BIOGEL PI INDICATOR 8.5 (GLOVE) ×6
GLOVE ECLIPSE 7.5 STRL STRAW (GLOVE) ×2 IMPLANT
GLOVE ECLIPSE 8.0 STRL XLNG CF (GLOVE) ×4 IMPLANT
GLOVE EXAM NITRILE LRG STRL (GLOVE) IMPLANT
GLOVE EXAM NITRILE MD LF STRL (GLOVE) IMPLANT
GLOVE EXAM NITRILE XL STR (GLOVE) IMPLANT
GLOVE EXAM NITRILE XS STR PU (GLOVE) IMPLANT
GLOVE SURG SS PI 6.5 STRL IVOR (GLOVE) ×3 IMPLANT
GLOVE SURG SS PI 8.0 STRL IVOR (GLOVE) ×4 IMPLANT
GOWN BRE IMP SLV AUR LG STRL (GOWN DISPOSABLE) IMPLANT
GOWN BRE IMP SLV AUR XL STRL (GOWN DISPOSABLE) ×9 IMPLANT
GOWN STRL REIN 2XL LVL4 (GOWN DISPOSABLE) ×7 IMPLANT
IMPLANT COROENT XL 12X22X55 ×2 IMPLANT
KIT BASIN OR (CUSTOM PROCEDURE TRAY) ×6 IMPLANT
KIT DILATOR XLIF 5 (KITS) IMPLANT
KIT INFUSE SMALL (Orthopedic Implant) ×2 IMPLANT
KIT MAXCESS (KITS) ×2 IMPLANT
KIT NDL NVM5 EMG ELECT (KITS) IMPLANT
KIT NEEDLE NVM5 EMG ELECT (KITS) ×2 IMPLANT
KIT NEEDLE NVM5 EMG ELECTRODE (KITS) ×1
KIT POSITION SURG JACKSON T1 (MISCELLANEOUS) ×2 IMPLANT
KIT ROOM TURNOVER OR (KITS) ×5 IMPLANT
KIT XLIF (KITS) ×1
MARKER SKIN DUAL TIP RULER LAB (MISCELLANEOUS) ×3 IMPLANT
NDL HYPO 25X1 1.5 SAFETY (NEEDLE) ×4 IMPLANT
NEEDLE HYPO 25X1 1.5 SAFETY (NEEDLE) ×3 IMPLANT
NEEDLE TROCAR TARGETING (NEEDLE) ×2 IMPLANT
NS IRRIG 1000ML POUR BTL (IV SOLUTION) ×5 IMPLANT
PACK LAMINECTOMY NEURO (CUSTOM PROCEDURE TRAY) ×6 IMPLANT
PAD ARMBOARD 7.5X6 YLW CONV (MISCELLANEOUS) ×9 IMPLANT
PATTIES SURGICAL .5 X.5 (GAUZE/BANDAGES/DRESSINGS) IMPLANT
PATTIES SURGICAL .5 X1 (DISPOSABLE) IMPLANT
PATTIES SURGICAL 1X1 (DISPOSABLE) IMPLANT
ROD TI PRECONT ILLICO 5.5X5 (Rod) ×4 IMPLANT
SCREW CANN PA ILLICO 6.5X45 (Screw) ×4 IMPLANT
SCREW SET SPINAL STD HEXALOBE (Screw) ×8 IMPLANT
SPONGE GAUZE 4X4 12PLY (GAUZE/BANDAGES/DRESSINGS) ×2 IMPLANT
SPONGE LAP 4X18 X RAY DECT (DISPOSABLE) IMPLANT
SPONGE SURGIFOAM ABS GEL SZ50 (HEMOSTASIS) IMPLANT
STAPLER SKIN PROX WIDE 3.9 (STAPLE) ×3 IMPLANT
STRIP CLOSURE SKIN 1/2X4 (GAUZE/BANDAGES/DRESSINGS) ×2 IMPLANT
SUT VIC AB 1 CT1 18XBRD ANBCTR (SUTURE) ×6 IMPLANT
SUT VIC AB 1 CT1 8-18 (SUTURE) ×6
SUT VIC AB 2-0 CT1 18 (SUTURE) ×9 IMPLANT
SUT VIC AB 3-0 SH 8-18 (SUTURE) ×9 IMPLANT
SYR 20ML ECCENTRIC (SYRINGE) ×5 IMPLANT
SYR INSULIN 1ML 31GX6 SAFETY (SYRINGE) IMPLANT
TAPE CLOTH 3X10 TAN LF (GAUZE/BANDAGES/DRESSINGS) ×9 IMPLANT
TIP TROCAR NITINOL ILLICO 18 (INSTRUMENTS) ×4 IMPLANT
TOWEL OR 17X24 6PK STRL BLUE (TOWEL DISPOSABLE) ×4 IMPLANT
TOWEL OR 17X26 10 PK STRL BLUE (TOWEL DISPOSABLE) ×6 IMPLANT
TRAY FOLEY CATH 14FRSI W/METER (CATHETERS) ×4 IMPLANT
TRAY FOLEY CATH 16FRSI W/METER (SET/KITS/TRAYS/PACK) ×1 IMPLANT
WATER STERILE IRR 1000ML POUR (IV SOLUTION) ×4 IMPLANT

## 2013-04-30 NOTE — Anesthesia Preprocedure Evaluation (Addendum)
Anesthesia Evaluation  Patient identified by MRN, date of birth, ID band Patient awake    Reviewed: Allergy & Precautions, H&P , NPO status , Patient's Chart, lab work & pertinent test results, reviewed documented beta blocker date and time   History of Anesthesia Complications Negative for: history of anesthetic complications  Airway Mallampati: III TM Distance: >3 FB Neck ROM: Full    Dental  (+) Teeth Intact and Dental Advisory Given   Pulmonary shortness of breath, sleep apnea and Continuous Positive Airway Pressure Ventilation , Current Smoker,  breath sounds clear to auscultation  Pulmonary exam normal       Cardiovascular negative cardio ROS  Rhythm:Regular Rate:Normal     Neuro/Psych PSYCHIATRIC DISORDERS Anxiety negative neurological ROS     GI/Hepatic Neg liver ROS, GERD-  Controlled,  Endo/Other  Morbid obesity  Renal/GU negative Renal ROS     Musculoskeletal   Abdominal (+) + obese,  Abdomen: soft. Bowel sounds: normal.  Peds  Hematology negative hematology ROS (+)   Anesthesia Other Findings   Reproductive/Obstetrics                        Anesthesia Physical Anesthesia Plan  ASA: II  Anesthesia Plan: General   Post-op Pain Management:    Induction: Intravenous  Airway Management Planned: Oral ETT  Additional Equipment:   Intra-op Plan:   Post-operative Plan: Extubation in OR  Informed Consent: I have reviewed the patients History and Physical, chart, labs and discussed the procedure including the risks, benefits and alternatives for the proposed anesthesia with the patient or authorized representative who has indicated his/her understanding and acceptance.   Dental advisory given  Plan Discussed with: CRNA and Surgeon  Anesthesia Plan Comments: (Plan routine monitors, GETA)        Anesthesia Quick Evaluation

## 2013-04-30 NOTE — Anesthesia Procedure Notes (Addendum)
Procedure Name: Intubation Date/Time: 04/30/2013 7:44 AM Performed by: Ellin Goodie Pre-anesthesia Checklist: Patient identified, Emergency Drugs available, Suction available, Patient being monitored and Timeout performed Patient Re-evaluated:Patient Re-evaluated prior to inductionOxygen Delivery Method: Circle system utilized Preoxygenation: Pre-oxygenation with 100% oxygen Intubation Type: IV induction and Rapid sequence Ventilation: Mask ventilation without difficulty Laryngoscope Size: Mac and 3 Grade View: Grade I Tube type: Oral Tube size: 7.5 mm Number of attempts: 2 Airway Equipment and Method: Stylet,  Video-laryngoscopy and LTA kit utilized Placement Confirmation: ETT inserted through vocal cords under direct vision,  positive ETCO2 and breath sounds checked- equal and bilateral Secured at: 23 cm Tube secured with: Tape Dental Injury: Teeth and Oropharynx as per pre-operative assessment  Difficulty Due To: Difficulty was anticipated and Difficult Airway- due to limited oral opening Future Recommendations: Recommend- induction with short-acting agent, and alternative techniques readily available Comments: DL x 1 with MAC 4 blade with view of epiglottis only, did not attempt to pass ETT, blade removed.  DL x 1 with video layngoscope MAC 3 blade with good view of cords, ETT passed easily.  Verification of ETT placement by Dr. Jean Rosenthal.  ETT secured and placement re-checked (after positioning by Dr. Venetia Maxon and staff).  Carlynn Herald, CRNA

## 2013-04-30 NOTE — Interval H&P Note (Signed)
History and Physical Interval Note:  04/30/2013 7:40 AM  Erik Holmes  has presented today for surgery, with the diagnosis of Lumbar spondylosis, Lumbar stenosis, Lumbar hnp with myelopathy, Lumbar radiculopathy  The various methods of treatment have been discussed with the patient and family. After consideration of risks, benefits and other options for treatment, the patient has consented to  Procedure(s) with comments: ANTERIOR LATERAL LUMBAR FUSION 1 LEVEL (N/A) - L3-4 Anterolateral decompression/fusion/percutaneous pedicle screws left side LUMBAR PERCUTANEOUS PEDICLE SCREW 1 LEVEL (N/A) as a surgical intervention .  The patient's history has been reviewed, patient examined, no change in status, stable for surgery.  I have reviewed the patient's chart and labs.  Questions were answered to the patient's satisfaction.     Arneda Sappington D

## 2013-04-30 NOTE — Progress Notes (Signed)
Paged Dr. Venetia Maxon reguarding allergy to pcn at 0545.

## 2013-04-30 NOTE — Progress Notes (Signed)
Awake, alert, conversant.  Full strength bilateral lower extremities.  Doing well. 

## 2013-04-30 NOTE — Plan of Care (Signed)
Problem: Consults Goal: Diagnosis - Spinal Surgery Outcome: Completed/Met Date Met:  04/30/13 Thoraco/Lumbar Spine Fusion

## 2013-04-30 NOTE — Op Note (Signed)
04/30/2013  11:17 AM  PATIENT:  Erik Holmes  52 y.o. male  PRE-OPERATIVE DIAGNOSIS:  Lumbar spondylosis, Lumbar stenosis, Lumbar herniated nucleus pulposus with myelopathy, Lumbar radiculopathy L 34  POST-OPERATIVE DIAGNOSIS:  Lumbar spondylosis, Lumbar stenosis, Lumbar herniated nucleus pulposus with myelopathy, Lumbar radiculopathy L 34  PROCEDURE:  Procedure(s) with comments: ANTERIOR LATERAL LUMBAR FUSION 1 LEVEL (Left) - Lumbar three-four  Anterolateral decompression/fusion/percutaneous pedicle screws left side LUMBAR PERCUTANEOUS PEDICLE SCREW 1 LEVEL (N/A) - Lumbar three-four  Anterolateral decompression/fusion/percutaneous pedicle screws  SURGEON:  Surgeon(s) and Role:    * Maeola Harman, MD - Primary    * Karn Cassis, MD - Assisting  PHYSICIAN ASSISTANT:   ASSISTANTS: Poteat, RN   ANESTHESIA:   general  EBL:  Total I/O In: 2000 [I.V.:2000] Out: 525 [Urine:525]  BLOOD ADMINISTERED:none  DRAINS: none   LOCAL MEDICATIONS USED:  MARCAINE     SPECIMEN:  No Specimen  DISPOSITION OF SPECIMEN:  N/A  COUNTS:  YES  TOURNIQUET:  * No tourniquets in log *  DICTATION: DICTATION: Patient is a 52 year old with spondylosis stenosis and herniated lumbar disc of the lumbar spine. It was elected to take him to surgery for anterolateral decompression and posterior percutaneous pedicle screw fixation.  Procedure: Patient was brought to the operating room and placed in a right lateral decubitus position on the operative table and using orthogonally projected C-arm fluoroscopy the patient was placed so that the  L3-4 level was visualized in AP and lateral plane. The patient was then taped into position. The table was flexed minimally so as to expose the L34 level. Skin was marked along with a posterior finger dissection incision. His flank was then prepped and draped in usual sterile fashion and incision was made at the L3-4 level. Posterior finger dissection was made to enter  the retroperitoneal space and then subsequently the probe was inserted into the psoas muscle from the left side initially at the L 34 level. After mapping the neural elements were able to dock the probe per the midpoint of this vertebral level and without indications electrically of too close proximity to the neural tissues. Subsequently the self-retaining tractor was.after sequential dilators were utilized the shim was employed and the interspace was cleared of psoas muscle and then incised. A thorough discectomy was performed. Various instruments were used to clear the interspace of disc material. After thorough discectomy was performed and this was performed using AP and lateral fluoroscopy a 12 standard by 55 x 22 mm implant was packed with BMP and NexOss with autologous blood. This was tamped into position using the slides and its position was confirmed on AP and lateral fluoroscopy. Hemostasis was assured the wounds were irrigated interrupted Vicryl sutures.  Patient was then turned into a prone position on the operating table on chest rolls and using AP and lateral fluoroscopy throughout this portion of the procedure, pedicle screws were placed using alphatech cannulated percutaneous screws. 2 screws were placed at L3 and (6.5 x 45 mm) and 2 at L4 of similar size. 50 mm rods were then affixed to the screw heads do a separate stab incision and locked down on the screws in compression. All connections were then torqued and the Towers were disassembled. The wounds were irrigated and then closed with 1, 2-0 and 3-0 Vicryl stitches. Sterile occlusive dressing was placed with Dermabond. The patient was then extubated in the operating room and taken to recovery in stable and satisfactory condition having tolerated his operation well. Counts  were correct at the end of the case.   PLAN OF CARE: Admit to inpatient   PATIENT DISPOSITION:  PACU - hemodynamically stable.   Delay start of Pharmacological VTE agent  (>24hrs) due to surgical blood loss or risk of bleeding: yes

## 2013-04-30 NOTE — H&P (Signed)
Erik Holmes, Jr.  #[id number] DOB:  [birth year] 02/11/2013:  Erik Holmes comes in today. He says he cannot stand the pain anymore.  He has had no change in his pain.  He has pain in his low back and both his legs to his toes.  He has cut down from smoking two packs to a half a pack with Nicorette gum. He has lost 25 lbs.  He is working out on a treadmill on a daily basis.  He has been using OxyIR 10 mg. four times daily.    At this point, I do think it makes sense to proceed with the previously discussed L3-4 anterolateral decompression and fusion with percutaneous pedicle screw fixation.  He wishes to go ahead with this and we have set this up for June of this year.  Risks and benefits were discussed with the patient in great detail.  We fitted him for an LSO brace and answered his questions.           Erik Holmes, M.D./aft  cc: Dr. Blair Holmes   Erik Holmes, Erik Holmes.  #[id number] DOB:  Sep 06, 1961 10/15/2012:     Erik Holmes comes in today complaining of low back pain, bilateral leg pain, left greater than right.  He has been working with Dr. Ethelene Holmes.  The last injection was last week.  He got some relief with that injection.  He works full time as a Curator.  He is taking Oxycodone 10/325 4 times daily.  I reviewed his MRI with him which shows prior fusion at L4-5 level without spinal stenosis or foraminal narrowing at this level.  There is very mild progression of degenerative changes at the L3-4 level with bilateral foraminal narrowing greater on the right with encroachment on the exiting L3 nerve roots.  I told him that I was very reluctant to recommend additional surgery.  He says he is extremely frustrated with the failure to improve in terms of his level of discomfort.  I told him he really need to work on weight control issues and did not think that his MRI was bad enough to merit surgery at the present time.  He is frustrated and is not sure he is going to be able to continue his  job.  He is going to see how he does with pain management and injections and will followup with me.            Erik Holmes, M.D./gde  cc: Dr. Blair Holmes    Erik Holmes, Erik Holmes. #[id number] DOB: 1960-12-31 09/05/2012:  Erik Holmes comes in today. I did L4-5 ALIF surgery 08/28/2007. He says he is doing miserably and has no position of comfort. He says his low back hurts, both legs.  He has pain with riding in the car after an hour.  He is working 12 to 14 hours a day and he is still quite uncomfortable.  He says he is still smoking, but says he is smoking less and he has lost about 25 lbs. He is taking Oxycodone which is being prescribed by Dr. Ethelene Holmes. He is taking 10/325 three to four times daily.    I examined him. Erik Holmes has full strength on confrontational testing.   He had lumbar radiographs of the lumbar spine which show that he has a solid arthrodesis at the previous ALIF surgery and with good positioning of interbody graft and anterior lumbar plate without any complicating features.  He has some degenerative changes at L3-4 and  L5-S1 levels which appear to be fairly minor.    I have recommended that he get a new MRI. I will see him back after that has been done and make further recommendations at that point.          Erik Holmes, M.D./aft  cc: Dr. 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 Ethelene Holmes for neurosurgical consultation with a chief complaint of bilateral lower extremity pain, currently 0/10 now, but at its worst 10/10 and low back pain currently 7/10 and at its worst 10/10.  He notes numbness into both of his legs.  He denies weakness.  He says this has  been going on for the last two years, but the signs and symptoms have increased recently. He says this became worse when he fell down steps two weeks ago.  He has had intermittent pain which does not appear to increase with activity or go away with rest.  He says usually both legs will hurt and at the same time, but sometimes the legs do not hurt him.  He thinks he has had a total of four intradiscal injections by Dr. Ethelene Holmes; all of which gave him temporary relief of his back pain, but which recurred at variable amounts of time after the injections had worn off.  He has been taking Roxicodone 15 mg. two in the morning, two at lunch, two in the evenings.  He is currently working.  He is 5'9" tall, 235 lbs.  He denies alcohol or drug use, but is a two-pack per day smoker.    He had an MRI of his lumbar spine which was performed on 09/11/05, which shows a central disc protrusion with probable annular fissure at L4-5 resulting in mild central stenosis without significant foraminal stenosis.  There is a mild disc bulge at L3-4 extending into the inferior recess of the right neural foramen, resulting in mild right foraminal narrowing.   REVIEW OF SYSTEMS:                                     A detailed Review of Systems sheet was reviewed with the patient.  Pertinent positives include under cardiovascular - he notes leg pain with walking; under musculoskeletal - he notes he has broken both hands, back pain, leg pain, arthritis, and neck pain; otherwise unremarkable.  All other systems  are negative; this includes Constitutional symptoms, Eyes, Ears, nose, mouth, throat, Endocrine, Respiratory, Gastrointestinal, Genitourinary, Integumentary & Breast, Neurologic, Psychiatric, Hematologic/Lymphatic, Allergic/Immunologic.    PAST MEDICAL HISTORY:                                  Medications and Allergies:                   He is currently taking Effexor daily for anxiety, Roxicodone for pain.  He notes ALLERGIES TO  CODEINE WHICH MAKES HIM SICK.    FAMILY HISTORY:                                                             His mother is 40 in fair health with osteoporosis and breast cancer.  His father is age 52 and in good health.                           Erik Holmes                               DOB: 02/21/2061                     #[id number]                                  June 20, 2007  Page Two   PHYSICAL EXAMINATION:                                General Appearance:                                             On examination today, Mr. Erik is a pleasant, cooperative, obese man in no acute distress.      Blood Pressure, Pulse:                                           His blood pressure is 122/86.  Heart rate is 70 and regular.      HEENT - normocephalic, atraumatic.  The pupils are equal, round and reactive to light.  The extraocular muscles are intact.  Sclerae - white.  Conjunctiva - pink.  Oropharynx benign.  Uvula midline.     Neck - there are no masses, meningismus, deformities, tracheal deviation, jugular vein distention or carotid bruits.  There is normal cervical range of motion.  Spurlings' test is negative without reproducible radicular pain turning the patient's head to either side.  Lhermitte's sign is not present with axial compression.      Respiratory - there is normal respiratory effort with good intercostal function.  Lungs are clear to auscultation.  There are no rales, rhonchi or wheezes.      Cardiovascular - the heart  has regular rate and rhythm to auscultation.  No murmurs are appreciated.  There is no extremity edema, cyanosis or clubbing.  There are palpable pedal pulses.      Abdomen - obese, soft, nontender, no hepatosplenomegaly appreciated or masses.  There are active bowel sounds.  No guarding or rebound.      Musculoskeletal Examination - he has pain at the lumbosacral junction.  He is able to bend to with two inches of the floor with his upper extremities  outstretched.  He is able to stand on his heels and toes.  He has a positive straight leg raise bilaterally for back pain.      NEUROLOGICAL EXAMINATION:               The patient is oriented to time, person and place and has good recall of both recent and remote memory with normal attention span and concentration.  The patient speaks with clear and fluent speech and exhibits normal language function and appropriate fund of knowledge.      Cranial Nerve Examination - pupils are equal, round and reactive to light.  Extraocular movements are full.  Visual fields are full to confrontational testing.  Facial sensation and facial movement are symmetric and intact.  Hearing is intact to finger rub.  Palate is upgoing.  Shoulder shrug is symmetric.  Tongue protrudes in the midline.    Erik Holmes                               DOB: 11/08/2061                     #[id number]                                  June 20, 2007  Page Three     Motor Examination - motor strength is 5/5 in the bilateral deltoids, biceps, triceps, handgrips, wrist extensors, interosseous.  In the lower extremities motor strength is 5/5 in hip flexion, extension, quadriceps, hamstrings, plantar flexion, dorsiflexion and extensor hallucis longus.      Sensory Examination - normal to light touch and pinprick sensation in the upper and lower extremities.     Deep Tendon Reflexes - 2 in the biceps, triceps, and brachioradialis, 2 in the knees, 2 in the ankles.  The great toes are downgoing to plantar stimulation.     Cerebellar Examination - normal coordination in upper and lower extremities and normal rapid alternating movements.  Romberg test is negative.    IMPRESSION AND RECOMMENDATIONS:             Erik Holmes is a 52 year old man with chronic intractable back pain with previous imaging demonstrating an L4-5 disc herniation with central stenosis at this level.  He has not had anymore recent imaging studies.  He has had good relief,  although temporary, with intradiscal steroid injections at the L4-5 level.    I talked to him at length about stopping smoking and also about weight control.  I also talked to him about the need to get a new MRI to see if there has been significant changes in his low back. I will see him back after this has been done. I told him there may be a role for anterior lumbar interbody fusion at the L4-5 level depending on the results of those imaging  studies.    [facility]    Erik Holmes, M.D.  JDS:aft cc:           Dr. Sheran Luz

## 2013-04-30 NOTE — Progress Notes (Signed)
Doing well 

## 2013-04-30 NOTE — Progress Notes (Signed)
Subjective: Patient reports "I'm hurting some in my back. My legs don't hurt"  Objective: Vital signs in last 24 hours: Temp:  [96.8 F (36 C)-98.7 F (37.1 C)] 98.7 F (37.1 C) (06/10 1616) Pulse Rate:  [63-78] 70 (06/10 1616) Resp:  [13-25] 18 (06/10 1616) BP: (120-159)/(74-94) 129/80 mmHg (06/10 1616) SpO2:  [92 %-96 %] 92 % (06/10 1616)  Intake/Output from previous day:   Intake/Output this shift: Total I/O In: 2480 [P.O.:480; I.V.:2000] Out: 725 [Urine:725]  Alert, conversant. Good strength BLE. Incisions with Dermabond. Reassured re: lumbar pain.   Lab Results: No results found for this basename: WBC, HGB, HCT, PLT,  in the last 72 hours BMET No results found for this basename: NA, K, CL, CO2, GLUCOSE, BUN, CREATININE, CALCIUM,  in the last 72 hours  Studies/Results: No results found.  Assessment/Plan: Doing well post-op   LOS: 0 days  Mobilize in LSO with PT.   Georgiann Cocker 04/30/2013, 4:26 PM

## 2013-04-30 NOTE — Brief Op Note (Signed)
04/30/2013  11:17 AM  PATIENT:  Erik Holmes  51 y.o. male  PRE-OPERATIVE DIAGNOSIS:  Lumbar spondylosis, Lumbar stenosis, Lumbar herniated nucleus pulposus with myelopathy, Lumbar radiculopathy L 34  POST-OPERATIVE DIAGNOSIS:  Lumbar spondylosis, Lumbar stenosis, Lumbar herniated nucleus pulposus with myelopathy, Lumbar radiculopathy L 34  PROCEDURE:  Procedure(s) with comments: ANTERIOR LATERAL LUMBAR FUSION 1 LEVEL (Left) - Lumbar three-four  Anterolateral decompression/fusion/percutaneous pedicle screws left side LUMBAR PERCUTANEOUS PEDICLE SCREW 1 LEVEL (N/A) - Lumbar three-four  Anterolateral decompression/fusion/percutaneous pedicle screws  SURGEON:  Surgeon(s) and Role:    * Serenidy Waltz, MD - Primary    * Ernesto M Botero, MD - Assisting  PHYSICIAN ASSISTANT:   ASSISTANTS: Poteat, RN   ANESTHESIA:   general  EBL:  Total I/O In: 2000 [I.V.:2000] Out: 525 [Urine:525]  BLOOD ADMINISTERED:none  DRAINS: none   LOCAL MEDICATIONS USED:  MARCAINE     SPECIMEN:  No Specimen  DISPOSITION OF SPECIMEN:  N/A  COUNTS:  YES  TOURNIQUET:  * No tourniquets in log *  DICTATION: DICTATION: Patient is a 51-year-old with spondylosis stenosis and herniated lumbar disc of the lumbar spine. It was elected to take him to surgery for anterolateral decompression and posterior percutaneous pedicle screw fixation.  Procedure: Patient was brought to the operating room and placed in a right lateral decubitus position on the operative table and using orthogonally projected C-arm fluoroscopy the patient was placed so that the  L3-4 level was visualized in AP and lateral plane. The patient was then taped into position. The table was flexed minimally so as to expose the L34 level. Skin was marked along with a posterior finger dissection incision. His flank was then prepped and draped in usual sterile fashion and incision was made at the L3-4 level. Posterior finger dissection was made to enter  the retroperitoneal space and then subsequently the probe was inserted into the psoas muscle from the left side initially at the L 34 level. After mapping the neural elements were able to dock the probe per the midpoint of this vertebral level and without indications electrically of too close proximity to the neural tissues. Subsequently the self-retaining tractor was.after sequential dilators were utilized the shim was employed and the interspace was cleared of psoas muscle and then incised. A thorough discectomy was performed. Various instruments were used to clear the interspace of disc material. After thorough discectomy was performed and this was performed using AP and lateral fluoroscopy a 12 standard by 55 x 22 mm implant was packed with BMP and NexOss with autologous blood. This was tamped into position using the slides and its position was confirmed on AP and lateral fluoroscopy. Hemostasis was assured the wounds were irrigated interrupted Vicryl sutures.  Patient was then turned into a prone position on the operating table on chest rolls and using AP and lateral fluoroscopy throughout this portion of the procedure, pedicle screws were placed using alphatech cannulated percutaneous screws. 2 screws were placed at L3 and (6.5 x 45 mm) and 2 at L4 of similar size. 50 mm rods were then affixed to the screw heads do a separate stab incision and locked down on the screws in compression. All connections were then torqued and the Towers were disassembled. The wounds were irrigated and then closed with 1, 2-0 and 3-0 Vicryl stitches. Sterile occlusive dressing was placed with Dermabond. The patient was then extubated in the operating room and taken to recovery in stable and satisfactory condition having tolerated his operation well. Counts   were correct at the end of the case.   PLAN OF CARE: Admit to inpatient   PATIENT DISPOSITION:  PACU - hemodynamically stable.   Delay start of Pharmacological VTE agent  (>24hrs) due to surgical blood loss or risk of bleeding: yes  

## 2013-04-30 NOTE — Transfer of Care (Signed)
Immediate Anesthesia Transfer of Care Note  Patient: Erik Holmes  Procedure(s) Performed: Procedure(s) with comments: ANTERIOR LATERAL LUMBAR FUSION 1 LEVEL (Left) - Lumbar three-four  Anterolateral decompression/fusion/percutaneous pedicle screws left side LUMBAR PERCUTANEOUS PEDICLE SCREW 1 LEVEL (N/A) - Lumbar three-four  Anterolateral decompression/fusion/percutaneous pedicle screws  Patient Location: PACU  Anesthesia Type:General  Level of Consciousness: awake, alert  and sedated  Airway & Oxygen Therapy: Patient connected to face mask oxygen  Post-op Assessment: Report given to PACU RN  Post vital signs: stable  Complications: No apparent anesthesia complications

## 2013-04-30 NOTE — Progress Notes (Signed)
Pt setup on CPAP Auto titrate Hi: 18 Lo: 5 with 2L O2 bled in. Pt is using his mask from home. Pt tolerating well.

## 2013-04-30 NOTE — OR Nursing (Signed)
Lumbar three-four anterolateral decompression/fusion complete @ 0930, positioned in prone position @ 0937, Lumbar percutaneous screw placement incision @ 1001

## 2013-04-30 NOTE — Anesthesia Postprocedure Evaluation (Signed)
  Anesthesia Post-op Note  Patient: Erik Holmes  Procedure(s) Performed: Procedure(s) with comments: ANTERIOR LATERAL LUMBAR FUSION 1 LEVEL (Left) - Lumbar three-four  Anterolateral decompression/fusion/percutaneous pedicle screws left side LUMBAR PERCUTANEOUS PEDICLE SCREW 1 LEVEL (N/A) - Lumbar three-four  Anterolateral decompression/fusion/percutaneous pedicle screws  Patient Location: PACU  Anesthesia Type:General  Level of Consciousness: awake, alert , oriented and patient cooperative  Airway and Oxygen Therapy: Patient Spontanous Breathing and Patient connected to nasal cannula oxygen  Post-op Pain: mild  Post-op Assessment: Post-op Vital signs reviewed, Patient's Cardiovascular Status Stable, Respiratory Function Stable, Patent Airway, No signs of Nausea or vomiting and Pain level controlled  Post-op Vital Signs: Reviewed and stable  Complications: No apparent anesthesia complications

## 2013-05-01 MED ORDER — HYDROMORPHONE HCL 4 MG PO TABS: 4.0000 mg | ORAL_TABLET | ORAL | Status: DC | PRN

## 2013-05-01 MED ORDER — METHOCARBAMOL 500 MG PO TABS: 500.0000 mg | ORAL_TABLET | Freq: Four times a day (QID) | ORAL | Status: DC | PRN

## 2013-05-01 MED ORDER — DIAZEPAM 5 MG PO TABS: 5.0000 mg | ORAL_TABLET | Freq: Four times a day (QID) | ORAL | Status: DC | PRN

## 2013-05-01 MED ORDER — HYDROMORPHONE HCL 2 MG PO TABS
4.0000 mg | ORAL_TABLET | ORAL | Status: DC | PRN
  Administered 2013-05-01: 4 mg via ORAL
  Filled 2013-05-01: qty 2

## 2013-05-01 NOTE — Discharge Summary (Signed)
Physician Discharge Summary  Patient ID: Erik Holmes MRN: [id number] DOB/AGE: Sep 24, 1961 52 y.o.  Admit date: 04/30/2013 Discharge date: 05/01/2013  Admission Diagnoses:Lumbar spondylosis, stenosis, spondylolisthesis, lumbar radiculopathy L 34  Discharge Diagnoses: Lumbar spondylosis, stenosis, spondylolisthesis, lumbar radiculopathy L 34 Active Problems:   * No active hospital problems. *   Discharged Condition: good  Hospital Course: Uncomplicated decompression and fusion L 34  Consults: None  Significant Diagnostic Studies: None  Treatments: surgery: decompression and fusion L 34   Discharge Exam: Blood pressure 97/66, pulse 79, temperature 98.6 F (37 C), temperature source Oral, resp. rate 18, SpO2 92.00%. Neurologic: Alert and oriented X 3, normal strength and tone. Normal symmetric reflexes. Normal coordination and gait Wound:CDI  Disposition: Home     Medication List    STOP taking these medications       indomethacin 50 MG capsule  Commonly known as:  INDOCIN     Oxycodone HCl 10 MG Tabs      TAKE these medications       diazepam 5 MG tablet  Commonly known as:  VALIUM  Take 1 tablet (5 mg total) by mouth every 6 (six) hours as needed.     HYDROmorphone 4 MG tablet  Commonly known as:  DILAUDID  Take 1 tablet (4 mg total) by mouth every 4 (four) hours as needed.     venlafaxine XR 75 MG 24 hr capsule  Commonly known as:  EFFEXOR-XR  Take 225 mg by mouth daily.         Signed: Dorian Heckle, MD 05/01/2013, 8:29 AM

## 2013-05-01 NOTE — Progress Notes (Signed)
Subjective: Patient reports doing well.  Objective: Vital signs in last 24 hours: Temp:  [96.8 F (36 C)-98.7 F (37.1 C)] 98.6 F (37 C) (06/11 0745) Pulse Rate:  [63-79] 79 (06/11 0745) Resp:  [13-25] 18 (06/11 0745) BP: (97-159)/(65-94) 97/66 mmHg (06/11 0745) SpO2:  [92 %-96 %] 92 % (06/11 0745)  Intake/Output from previous day: 06/10 0701 - 06/11 0700 In: 2840 [P.O.:840; I.V.:2000] Out: 725 [Urine:725] Intake/Output this shift: Total I/O In: 240 [P.O.:240] Out: -   Physical Exam: Up and ambulating.  Dressings CDI.  Lab Results: No results found for this basename: WBC, HGB, HCT, PLT,  in the last 72 hours BMET No results found for this basename: NA, K, CL, CO2, GLUCOSE, BUN, CREATININE, CALCIUM,  in the last 72 hours  Studies/Results: No results found.  Assessment/Plan: Doing well. D/C home.    LOS: 1 day    Dorian Heckle, MD 05/01/2013, 8:25 AM

## 2013-05-01 NOTE — Evaluation (Signed)
Physical Therapy Evaluation Patient Details Name: Erik Holmes MRN: [id number] DOB: [birth year] Today's Date: 05/01/2013 Time: 4098-1191 PT Time Calculation (min): 28 min  PT Assessment / Plan / Recommendation Clinical Impression  52 yo with h/o previous back surgery presents postop day 1 with moderate soreness in back and resolved pain in legs.  Experienced and confident in postop back precautions, use of device, don/doff of LSO, and postop activity recommendations.  Instructed to limit sitting 60 min or less, ambulate 5-10 minute bouts several times per day and increase slowly as tolerated, wear brace when up and rest in bed occassionally.  Pt/spoused verbalized understanding and denied any questions/concerns with adherence.  Recommend discuss benefits of OPPT once medically cleared for increased activity by MD to progress trunk strength and stability for longterm prevention.  No other PT needs.  Discussed toileting and dressing needs and recommend elevated toilet seat, hip kit, and shower seat (pt knows this is self-pay) for maximum safety with functional recovery, pt/spouse verbally acknowledge.  Will communicate with OT.  Will d/c services with instructions to ambulate multiple short bouts while in hospital.  No other needs.    PT Assessment  Patent does not need any further PT services    Follow Up Recommendations  No PT follow up (consider OPPT after medically cleared by MD))    Does the patient have the potential to tolerate intense rehabilitation      Barriers to Discharge        Equipment Recommendations   (ELEVATED TOILET SEAT WITH HANDLES)    Recommendations for Other Services     Frequency      Precautions / Restrictions Precautions Precautions: Back Precaution Booklet Issued: No Precaution Comments: Pt restates no bending arching twisting lifting, demonstrates incorporation into functional activity and restates/demonstrates use of LSO brace appropriately.  Denies  concerns or questions with adherence. Required Braces or Orthoses: Spinal Brace Spinal Brace: Lumbar corset;Applied in sitting position   Pertinent Vitals/Pain Soreness in back, no pain in legs      Mobility  Bed Mobility Bed Mobility: Right Sidelying to Sit;Sit to Sidelying Right;Rolling Right Rolling Right: 7: Independent Right Sidelying to Sit: 7: Independent Sit to Sidelying Right: 6: Modified independent (Device/Increase time) Details for Bed Mobility Assistance: denies concerns or problems with bed mobility Transfers Transfers: Sit to Stand;Stand to Sit Sit to Stand: 6: Modified independent (Device/Increase time) Stand to Sit: 6: Modified independent (Device/Increase time) Details for Transfer Assistance: uses cane, appropriate safety no concerns voiced Ambulation/Gait Ambulation/Gait Assistance: 6: Modified independent (Device/Increase time) Ambulation Distance (Feet): 150 Feet Assistive device: Straight cane Ambulation/Gait Assistance Details: cane in right hand, used mostly for pain control in back Gait Pattern: Step-through pattern;Antalgic Stairs: Yes Number of Stairs: 10    Exercises     PT Diagnosis:    PT Problem List:   PT Treatment Interventions:     PT Goals Acute Rehab PT Goals PT Goal Formulation: With patient  Visit Information  Last PT Received On: 05/01/13 Assistance Needed: +1    Subjective Data  Subjective: This is my second surgery Patient Stated Goal: Go home   Prior Functioning  Home Living Lives With: Spouse Available Help at Discharge: Family Type of Home: House Home Access: Stairs to enter Secretary/administrator of Steps: 10 Entrance Stairs-Rails: Right Home Layout: One level Home Adaptive Equipment: Straight cane Additional Comments: reports low toilet and requests something to raise toilet seat.  Recommended shower seat (has handled shower nozzle, borrowed seat in past) and  hip kit, pt/spouse will consider. Prior  Function Level of Independence: Independent with assistive device(s) Able to Take Stairs?: Yes Communication Communication: No difficulties Dominant Hand: Right    Cognition  Cognition Arousal/Alertness: Awake/alert Behavior During Therapy: WFL for tasks assessed/performed Overall Cognitive Status: Within Functional Limits for tasks assessed    Extremity/Trunk Assessment Right Upper Extremity Assessment RUE ROM/Strength/Tone: [facility] for tasks assessed Left Upper Extremity Assessment LUE ROM/Strength/Tone: [facility] for tasks assessed Right Lower Extremity Assessment RLE ROM/Strength/Tone: [facility] for tasks assessed Left Lower Extremity Assessment LLE ROM/Strength/Tone: WFL for tasks assessed   Balance    End of Session PT - End of Session Equipment Utilized During Treatment: Back brace Activity Tolerance: Patient tolerated treatment well Patient left: in bed;with family/visitor present;with call bell/phone within reach;with nursing in room Nurse Communication: Mobility status;Patient requests pain meds  GP     Narda Amber [facility] 05/01/2013, 9:04 AM

## 2013-05-01 NOTE — Progress Notes (Signed)
Pt. Alert and oriented,follows simple instructions, denies pain. Incision area without swelling, redness or S/S of infection. Voiding adequate clear yellow urine. Moving all extremities well and vitals stable and documented. Lumbar surgery notes instructions given to patient and family member for home safety and precautions.  Equipment also given for home use. Pt. and family stated understanding of instructions given.

## 2013-05-01 NOTE — Evaluation (Signed)
Occupational Therapy Evaluation Patient Details Name: Erik Holmes MRN: [id number] DOB: [birth year] Today's Date: 05/01/2013 Time: 4098-1191 OT Time Calculation (min): 14 min  OT Assessment / Plan / Recommendation Clinical Impression  Pt is recovering from ALIF, L3-4.  Educated pt in back precautions and use of AE and DME.  No further OT needs.    OT Assessment  Patient does not need any further OT services    Follow Up Recommendations  Supervision - Intermittent    Barriers to Discharge      Equipment Recommendations  3 in 1 bedside comode    Recommendations for Other Services    Frequency       Precautions / Restrictions Precautions Precautions: Back Precaution Booklet Issued: Yes (comment) Precaution Comments: Reviewed back handout. Required Braces or Orthoses: Spinal Brace Spinal Brace: Lumbar corset;Applied in sitting position Restrictions Weight Bearing Restrictions: No   Pertinent Vitals/Pain Post surgical back pain, premedicated, did not rate    ADL  Eating/Feeding: Independent Where Assessed - Eating/Feeding: Edge of bed Grooming: Modified independent Where Assessed - Grooming: Unsupported sitting Upper Body Bathing: Set up Where Assessed - Upper Body Bathing: Unsupported sitting Lower Body Bathing: Minimal assistance Where Assessed - Lower Body Bathing: Unsupported sitting;Supported sit to stand Upper Body Dressing: Independent Where Assessed - Upper Body Dressing: Unsupported sitting Lower Body Dressing: Minimal assistance Where Assessed - Lower Body Dressing: Unsupported sitting;Supported sit to stand Toilet Transfer: Modified independent Toilet Transfer Method: Sit to Barista: Raised toilet seat with arms (or 3-in-1 over toilet) Toileting - Clothing Manipulation and Hygiene: Modified independent Where Assessed - Glass blower/designer Manipulation and Hygiene: Standing Equipment Used: Reacher;Sock aid;Long-handled shoe  horn;Long-handled sponge;Cane Transfers/Ambulation Related to ADLs: mod independent with cane ADL Comments: Educated pt in back precautions related to ADL and IADL including AE.  Pt was reliant on wife to donn socks PTA, no interest in sock aide.  Educated in use of reacher and long sponge and where pt may purchase and multiple uses of 3 in1.  Also educated in toilet tongs and where pt may purchase.    OT Diagnosis:    OT Problem List:   OT Treatment Interventions:     OT Goals    Visit Information  Last OT Received On: 05/01/13 Assistance Needed: +1    Subjective Data  Subjective: "This ain't my first rodeo." Patient Stated Goal: Home with wife.   Prior Functioning     Home Living Lives With: Spouse Available Help at Discharge: Family Type of Home: House Home Access: Stairs to enter Secretary/administrator of Steps: 10 Entrance Stairs-Rails: Right Home Layout: One level Bathroom Shower/Tub: Health visitor: Standard Home Adaptive Equipment: Straight cane Additional Comments: recommended reacher and long sponge, 3 in1 ordered by PT--educated in multiple uses. Prior Function Level of Independence: Independent with assistive device(s) Able to Take Stairs?: Yes Driving: Yes Communication Communication: No difficulties Dominant Hand: Right         Vision/Perception Vision - History Patient Visual Report: No change from baseline   Cognition  Cognition Arousal/Alertness: Awake/alert Behavior During Therapy: WFL for tasks assessed/performed Overall Cognitive Status: Within Functional Limits for tasks assessed    Extremity/Trunk Assessment Right Upper Extremity Assessment RUE ROM/Strength/Tone: WFL for tasks assessed RUE Coordination: WFL - gross/fine motor Left Upper Extremity Assessment LUE ROM/Strength/Tone: Within functional levels LUE Coordination: WFL - gross/fine motor  Trunk Assessment Trunk Assessment: Normal     Mobility Bed  Mobility Bed Mobility: Not assessed Transfers: Sit to  Stand;Stand to Sit Sit to Stand: 6: Modified independent (Device/Increase time) Stand to Sit: 6: Modified independent (Device/Increase time) Details for Transfer Assistance: uses cane, appropriate safety no concerns voiced     Exercise     Balance     End of Session OT - End of Session Patient left: in bed;with call bell/phone within reach;with family/visitor present;with nursing in room (EOB)  GO Functional Assessment Tool Used: clincal judgment Functional Limitation: Self care Self Care Current Status (Z6109): At least 1 percent but less than 20 percent impaired, limited or restricted Self Care Goal Status (U0454): At least 1 percent but less than 20 percent impaired, limited or restricted Self Care Discharge Status [phone]): At least 1 percent but less than 20 percent impaired, limited or restricted   [name] 05/01/2013, 10:07 AM [phone]

## 2013-05-02 ENCOUNTER — Encounter (HOSPITAL_COMMUNITY): Payer: Self-pay | Admitting: Neurosurgery

## 2013-05-28 ENCOUNTER — Encounter (INDEPENDENT_AMBULATORY_CARE_PROVIDER_SITE_OTHER): Payer: Self-pay | Admitting: General Surgery

## 2013-05-28 ENCOUNTER — Ambulatory Visit (INDEPENDENT_AMBULATORY_CARE_PROVIDER_SITE_OTHER): Payer: 59 | Admitting: General Surgery

## 2013-05-28 VITALS — BP 130/88 | HR 66 | Temp 97.9°F | Resp 16 | Ht 70.0 in | Wt 254.8 lb

## 2013-05-28 DIAGNOSIS — L290 Pruritus ani: Secondary | ICD-10-CM

## 2013-05-28 MED ORDER — HYDROCORTISONE 2.5 % RE CREA: TOPICAL_CREAM | Freq: Two times a day (BID) | RECTAL | Status: DC

## 2013-05-28 NOTE — Progress Notes (Signed)
Chief Complaint  Patient presents with  . New Evaluation    eval hems/itching/bumps    HISTORY: Erik Buffalo. is a 52 y.o. male who presents to the office with rectal pain and bleeding on TP.  Other symptoms include burning and itching.  This had been occurring for about a yr.  He has tried hemorrhoid creams, pads and steroids in the past with some success.  Nothing makes the symptoms worse.   It is intermittent in nature.  His bowel habits are regular and his bowel movements are usually soft.  He has had to strain occasionally.  His fiber intake is moderate.  His last colonoscopy was recently and he is due back this year for surveillance.  He does feel some prolapsing tissue.     Past Medical History  Diagnosis Date  . Degenerative disc disease, lumbar   . Anxiety   . Shortness of breath   . Sleep apnea     cpap  . RLS (restless legs syndrome)   . Gout   . Depression       Past Surgical History  Procedure Laterality Date  . Spine surgery  2008    lumbar fusion  . Cardiac catheterization      90's  neg  . Eye surgery    . Anterior lat lumbar fusion Left 04/30/2013    Procedure: ANTERIOR LATERAL LUMBAR FUSION 1 LEVEL;  Surgeon: Maeola Harman, MD;  Location: [facility];  Service: Neurosurgery;  Laterality: Left;  Lumbar three-four  Anterolateral decompression/fusion/percutaneous pedicle screws left side  . Lumbar percutaneous pedicle screw 1 level N/A 04/30/2013    Procedure: LUMBAR PERCUTANEOUS PEDICLE SCREW 1 LEVEL;  Surgeon: Maeola Harman, MD;  Location: [facility];  Service: Neurosurgery;  Laterality: N/A;  Lumbar three-four  Anterolateral decompression/fusion/percutaneous pedicle screws        Current Outpatient Prescriptions  Medication Sig Dispense Refill  . diazepam (VALIUM) 5 MG tablet Take 1 tablet (5 mg total) by mouth every 6 (six) hours as needed.  30 tablet  0  . HYDROcodone Bitartrate ER 10 MG CP12 Take by mouth.      . venlafaxine (EFFEXOR-XR) 75 MG 24 hr  capsule Take 225 mg by mouth daily.      . hydrocortisone (ANUSOL-HC) 2.5 % rectal cream Place rectally 2 (two) times daily.  30 g  0   No current facility-administered medications for this visit.      Allergies  Allergen Reactions  . Bee Venom Anaphylaxis  . Penicillins Anaphylaxis  . Codeine Other (See Comments)    Made pt feel like skin was crawling      Family History  Problem Relation Age of Onset  . Cancer Mother   . Heart disease Father     History   Social History  . Marital Status: Married    Spouse Name: N/A    Number of Children: N/A  . Years of Education: N/A   Social History Main Topics  . Smoking status: Current Every Day Smoker -- 2.00 packs/day for 40 years  . Smokeless tobacco: None  . Alcohol Use: No  . Drug Use: No  . Sexually Active: None   Other Topics Concern  . None   Social History Narrative  . None      REVIEW OF SYSTEMS - PERTINENT POSITIVES ONLY: Review of Systems - General ROS: negative for - chills, fever or weight loss Hematological and Lymphatic ROS: negative for - bleeding problems, blood clots or  bruising Respiratory ROS: no cough, shortness of breath, or wheezing Cardiovascular ROS: no chest pain or dyspnea on exertion Gastrointestinal ROS: no abdominal pain, change in bowel habits, or black or bloody stools Genito-Urinary ROS: no dysuria, trouble voiding, or hematuria  EXAM: Filed Vitals:   05/28/13 1127  BP: 130/88  Pulse: 66  Temp: 97.9 F (36.6 C)  Resp: 16    General appearance: alert and cooperative Resp: clear to auscultation bilaterally Cardio: regular rate and rhythm GI: soft, non-tender; bowel sounds normal; no masses,  no organomegaly   Procedure: Anoscopy Surgeon: Maisie Fus Diagnosis: anal itching  Assistant: glaspey After the risks and benefits were explained, verbal consent was obtained for above procedure  Anesthesia: none Findings: small skin tag R post, mildly inflamed grade 1 internal  hemorrhoids    ASSESSMENT AND PLAN: Erik Covalt. is a 52 y.o. M who presents to the office with anal itching with occasional bleeding.  On exam he has some mildly inflamed internal hemorrhoids that may be causing some drainage that causes him to itch.  He also appears to be over-wiping, which contributes to itching.  I have asked him to change his toileting habits and to increase his fiber intake to 25g of fiber/day.  I have given him a list of foods that can also cause itching.  I have also prescribed a hemorrhoid cream to use PRN.  Hopefully these steps will resolve his problem.  If not, then we will entertain removal of his skin tag as the next step.      Vanita Panda, MD Colon and Rectal Surgery / General Surgery [facility]      Visit Diagnoses: 1. Anal itching     Primary Care Physician: Thora Lance, MD

## 2013-05-28 NOTE — Patient Instructions (Addendum)
Fiber Chart  You should 25-30g of fiber per day and drinking 8 glasses of water to help your bowels move regularly.  In the chart below you can look up how much fiber you are getting in an average day.  If you are not getting enough fiber, you should add a fiber supplement to your diet.  Examples of this include Metamucil, FiberCon and Citrucel.  These can be purchased at your local grocery store or pharmacy.      [email or web address]-guerrero.[email or web address]   [place]. Irregular bowel habits such as constipation can lead to many problems over time.  Having one soft bowel movement a day is the most important way to prevent further problems.  The anorectal canal is designed to handle stretching and feces to safely manage our ability to get rid of solid waste (feces, poop, stool) out of our body.  BUT, hard constipated stools can act like ripping concrete bricks causing inflamed hemorrhoids, anal fissures, abdominal pain and bloating.     The goal: ONE SOFT BOWEL MOVEMENT A DAY!  To have soft, regular bowel movements:    Drink at least 8 tall glasses of water a day.     Take plenty of fiber.  Fiber is the undigested part of plant food that passes into the colon, acting s "natures broom" to encourage bowel motility and movement.  Fiber can absorb and hold large amounts of water. This results in a larger, bulkier stool, which is soft and easier to pass. Work gradually over several weeks up to 6 servings a day of fiber (25g a day even more if needed) in the form of: o Vegetables -- Root (potatoes, carrots, turnips), leafy green (lettuce, salad greens, celery, spinach), or cooked high residue (cabbage, broccoli, etc) o Fruit -- Fresh (unpeeled skin & pulp), Dried (prunes, apricots, cherries, etc ),  or stewed ( applesauce)  o Whole grain breads, pasta, etc (whole wheat)  o Bran cereals    Bulking Agents -- This type of water-retaining fiber generally is  easily obtained each day by one of the following:  o Psyllium bran -- The psyllium plant is remarkable because its ground seeds can retain so much water. This product is available as Metamucil, Konsyl, Effersyllium, Per Diem Fiber, or the less expensive generic preparation in drug and health food stores. Although labeled a laxative, it really is not a laxative.  o Methylcellulose -- This is another fiber derived from wood which also retains water. It is available as Citrucel. o Polyethylene Glycol - and "artificial" fiber commonly called Miralax or Glycolax.  It is helpful for people with gassy or bloated feelings with regular fiber o Flax Seed - a less gassy fiber than psyllium   No reading or other relaxing activity while on the toilet. If bowel movements take longer than 5 minutes, you are too constipated   AVOID CONSTIPATION.  High fiber and water intake usually takes care of this.  Sometimes a laxative is needed to stimulate more frequent bowel movements, but    Laxatives are not a good long-term solution as it can wear the colon out. o Osmotics (Milk of Magnesia, Fleets phosphosoda, Magnesium citrate, MiraLax, GoLytely) are safer than  o Stimulants (Senokot, Castor Oil, Dulcolax, Ex Lax)    o Do not take laxatives for more than 7days in a row.    IF SEVERELY CONSTIPATED, try a Bowel Retraining Program: o Do not use laxatives.  o Eat a diet high in roughage, such as  bran cereals and leafy vegetables.  o Drink six (6) ounces of prune or apricot juice each morning.  o Eat two (2) large servings of stewed fruit each day.  o Take one (1) heaping tablespoon of a psyllium-based bulking agent twice a day. Use sugar-free sweetener when possible to avoid excessive calories.  o Eat a normal breakfast.  o Set aside 15 minutes after breakfast to sit on the toilet, but do not strain to have a bowel movement.  o If you do not have a bowel movement by the third day, use an enema and repeat the above steps.     Pruritus Ani  What is Pruritus Ani (proo-r-tus a-n)? Itching around the anal area is called pruritus ani. This condition results in a compelling urge to scratch. What causes this to happen? Several factors may be at fault. A common cause is excessive moisture in the anal area. Moisture may be due to perspiration or a small amount of residual stool around the anal area. Pruritis ani may be a symptom of other common anal conditions such as hemorrhoids and anal fissures. The initial condition can be made worse by scratching, vigorous cleansing of the area or overuse of topical treatments. In some individuals pruritus ani may be caused by eating certain foods, smoking and drinking alcoholic beverages, especially beer and wine. Food items that have been associated with pruritus ani include: . Coffee, Tea . Carbonated beverages . Milk products . Tomatoes and tomato products such as Ketchup, spicey foods . Chocolate and Nuts  Idiopathic In over 75% of cases of pruritus, the cause of the pruritus is not known (i.e. idiopathic pruritus). Dermatitis Skin conditions such as dermatitis or psoriasis also can irritate the anus and result in anal pruritus. These may respond to corticosteroid creams.  Moisture Moisture due to excessive perspiration, frequent liquid stools (diarrhoea), or a degree of faecal incontinence where there is a weak anal sphincter leading to seepage can exacerbate this condition. Moisture can also result from an abnormal passageway communicating between the anus and external skin (anal fistula). A fistula brings contaminated and irritating fluids to the anal area. Moisture can also result from excessive mucous discharge, a common problem with haemorrhoids and rectal prolapse, where the mucous secreting mucosa of the anus drops below the anal sphincter (prolapses) . Infections Infection with pinworm is common in those with young children and household pets. Less common is  infestation with scabies or mites. These can all be tested for with skin scrapings or the "sellotape test" which are then sent off for viewing under microscopy. Yeast or fungal infections may occur if there is moisture around the anus. They more often occur in people who are immune-compromised including diabetics, transplant recipients, those taking chemotherapy, and those with HIV. Anal Cancer Anal cancer is uncommon, as are precancerous lesions (Bowen's and Paget's disease). However, when present, they may first present as a perianal itch. It is therefore important for your colorectal surgeon to examine the area, and on occasions a biopsy if required to exclude anal cancer.  Does Pruritus Ani result from lack of cleanliness? Cleanliness is almost never a factor. However, the natural tendency once a person develops this itching is to wash the area vigorously and frequently with soap and a washcloth. This almost always makes the problem worse by damaging the skin and washing away protective natural oils. What can be done to make this itching go away? A careful examination by a colon and rectal surgeon or other physician  may identify a definite cause for the itching. Your physician can recommend treatment to eliminate the specific problem. Treatment of pruritus ani may include these three points. 1. AVOID MOISTURE in the anal area: . Apply either a few wisps of cotton, a 4 x 4 gauze or some cornstarch powder to keep the area dry. . Avoid all medicated, perfumed and deodorant powders. 2. AVOID FURTHER TRAUMA to the affected area: Marland Kitchen Do not use soap of any kind on the anal area. . Do not scrub the anal area with anything - even toilet paper. . For hygiene, it is best to rinse with warm water and pat the area dry. Use wet toilet paper, baby wipes or a wet washcloth to blot the area clean. Never rub. . Try not to scratch the itchy area. Scratching produces more damage, which in turn makes the itching  worse. For individuals that experience irresistible itching at night, wearing socks on the hands may be helpful. 3. USE ONLY MEDICATIONS AS DIRECTED BY YOUR PHYSICIAN. Apply prescription medications sparingly to the skin around the anal area and avoid rubbing. Prolonged use of prescribed or over the counter topical medications may result in irritation or skin dryness that can make the condition worse. How long does this treatment usually take? Most people experience some relief from itching within a week. If symptoms do not resolve after 6 weeks, a follow-up appointment with your colon and rectal surgeon may be needed.             TREATMENT Management must be directed at breaking the "itch-scratch-itch" cycle as well as identifying causes and irritants and treating or avoiding these. It is important to clean and dry the anus thoroughly and avoid leaving soap in the anal area. Cleaning efforts should include gentle showering without direct rubbing or irritation of the skin with either the washcloth or towel. After bowel movements, wet cleaning of the perianal region either with a bidet or with moist wipes may be preferable to toilet paper. Scratching the affected area is to be resisted, as it only aggravates the problem and can lead to bleeding from the anal area. Synthetic underwear should be avoided. Irritant washing powders can also aggravate the problem. A gauze pad or combine, folded in half and placed between the buttocks so that it is in close proximity to the anus, is an effective way of reducing moisture to the region. BABY WIPES OR BIDET Baby wipes may be preferable to abrasive toilet paper and can help reduce friction, however perfumed baby wipes should be avoided. The [place] bidet used to wash the anal region after a bowel movement is an alternative to baby wipes. The conventional toilet can also have a bidet appliance attached to it. BULKING AGENTS Anal pruritus is often exacerbated by  watery stools. A tablespoon or sachet of ispaghula husk (Metamucil or Fibogel) twice a day, can firm loose stools. TOPICAL CREAMS AND OINTMENTS There are many over-the-counter creams or ointments that can be applied to the anus to reduce itch. Most of these creams have a barrier compound such as petroleum jelly (Vasoline) or zinc oxide that acts as a protectant and should be applied as a thin film to avoid excessive moisture. In addition they usually contain a small amount of one or more active ingredients. The active ingredients include an antiseptic (chlorhexidine), a local anaesthetic agents (lignocaine, benzocaine, cinchocaine) that numb the area, corticosteroids (hydrocortisone, fluocortolone, prednisolone) that reduce inflammation in the area, and vasoconstrictors (adrenaline) that make the blood  vessels in the area become smaller, which may reduce swelling and help dry the area. Most creams just contain a corticosteroid with a local anaesthetic (Proctosedyl, Rectinol HC, Scheriproct, Ultraproct), others contain a vasoconstrictor with local anaesthetic (Rectinol). Some creams have all four active ingredients. ANTIHISTAMINES Antihistamines have been shown to reduce itch[1]. However most are sedative, and are best taken in the evening. CORTICOSTEROID CREAMS Stronger 1% corticosteroid ointments containing hydrocortisone (Egorcort Sigmacort) betamethasone (Diprosone) may be obtained with a prescription, and have been shown to reduce inflammation and relieve itching [2-3]. They should not be used long term (i.e. more than a few days to two weeks), as chronic use can cause permanent damage to the skin. TOPICAL CAPSAICIN Topical capsaicin is a novel agent that has achieved success rates of up to 70%. It causes a low grade burning sensation, that over time, produces inhibitory neural feedback at the spinal cord level which decreases the perception of itch[4]. ANAL TATTOOING WITH METHYLENE BLUE    For severe intractable cases that have not responded to the above measures, anal tattooing with intradermal injection of methylene blue has been described with success rates of 80%[5]. Significant adverse outcomes including skin necrosis have been reported. Good results, free of complication, have been achieved with a more dilute injection of methylene blue mixed with local anaesthetic agent and steroid [6]. The mechanism of action is thought to be due to destruction of nerve endings in the peri-anal skin.     2012 American Society of Colon & Rectal Sur[employer]

## 2013-09-09 ENCOUNTER — Telehealth (INDEPENDENT_AMBULATORY_CARE_PROVIDER_SITE_OTHER): Payer: Self-pay

## 2013-09-09 DIAGNOSIS — L290 Pruritus ani: Secondary | ICD-10-CM

## 2013-09-09 MED ORDER — HYDROCORTISONE 2.5 % RE CREA: TOPICAL_CREAM | Freq: Two times a day (BID) | RECTAL | Status: DC

## 2013-09-09 NOTE — Telephone Encounter (Signed)
I called to let the patient know that Dr Maisie Fus approved his refill on cream.  She wanted to know if he was following her recommendations and he said he did all of that.  The cream is the only thing that helps the itch.  He also asked if she can remove his skin tags or if he has to go somewhere else.  I told him she may want to examine him again 1st.  He refuses to come in 1st due to financial reasons.  He only brings in $60 per month until he gets his disability.  I will call him back once Dr Maisie Fus lets me know what she advises.  I don't know if it can be done in the office or not.

## 2013-09-09 NOTE — Telephone Encounter (Signed)
Message copied by Ivory Broad on Mon Sep 09, 2013  2:59 PM ------      Message from: Roxie, Broadus John.      Created: Mon Sep 09, 2013  2:07 PM       That's fine.  One refill.  Please make sure he is doing the other things that I asked him to do.             "I have asked him to change his toileting habits (don't over-wipe) and to increase his fiber intake to 25g of fiber/day.  I have given him a list of foods that can also cause itching."            AT                   ----- Message -----         From: Ivory Broad, RN         Sent: 09/09/2013   8:53 AM           To: Romie Levee, MD            Pt requesting refill on Proctozone 2.5% cream.  Do you want to refill?  You said f/u prn.                  [place] Dec       ------

## 2013-09-10 NOTE — Telephone Encounter (Signed)
I called and left a message for the pt to call me.  He needs to be told Dr Maisie Fus wants him to come in and talk about what removal of the skin tags will entail.  She will probably check him again and see if it can be done under local in the office or not.

## 2013-09-10 NOTE — Telephone Encounter (Signed)
Sounds like it may be time to remove it, but he needs to come in and talk about what that entails.

## 2013-11-21 HISTORY — PX: OTHER SURGICAL HISTORY: SHX169

## 2014-03-04 ENCOUNTER — Encounter: Payer: Self-pay | Admitting: Internal Medicine

## 2014-04-24 ENCOUNTER — Encounter: Payer: Self-pay | Admitting: Internal Medicine

## 2014-04-24 ENCOUNTER — Ambulatory Visit (INDEPENDENT_AMBULATORY_CARE_PROVIDER_SITE_OTHER): Payer: 59 | Admitting: Internal Medicine

## 2014-04-24 VITALS — BP 132/80 | HR 68 | Ht 69.0 in | Wt 253.6 lb

## 2014-04-24 DIAGNOSIS — K644 Residual hemorrhoidal skin tags: Secondary | ICD-10-CM

## 2014-04-24 DIAGNOSIS — B356 Tinea cruris: Secondary | ICD-10-CM

## 2014-04-24 DIAGNOSIS — Z860100 Personal history of colon polyps, unspecified: Secondary | ICD-10-CM | POA: Insufficient documentation

## 2014-04-24 DIAGNOSIS — R39198 Other difficulties with micturition: Secondary | ICD-10-CM | POA: Insufficient documentation

## 2014-04-24 DIAGNOSIS — L290 Pruritus ani: Secondary | ICD-10-CM | POA: Insufficient documentation

## 2014-04-24 DIAGNOSIS — R3919 Other difficulties with micturition: Secondary | ICD-10-CM

## 2014-04-24 DIAGNOSIS — K648 Other hemorrhoids: Secondary | ICD-10-CM

## 2014-04-24 DIAGNOSIS — N529 Male erectile dysfunction, unspecified: Secondary | ICD-10-CM | POA: Insufficient documentation

## 2014-04-24 HISTORY — DX: Other hemorrhoids: K64.8

## 2014-04-24 NOTE — Assessment & Plan Note (Signed)
Zeasorb AF powder

## 2014-04-24 NOTE — Assessment & Plan Note (Signed)
Grade 2-3 all - RP banded today RTC 2 weeks and band 1-2 more Stay on softeners and fiber

## 2014-04-24 NOTE — Assessment & Plan Note (Signed)
Refer to GU

## 2014-04-24 NOTE — Assessment & Plan Note (Addendum)
04/29/2011 - diminutive tubular adenoma and serrated adenoma cecum removed, tubular adenoma bx IC valve, tubular adenoma 3 mm removed transverse colon (Outlaw) 10/29/2012 - 6 diminutive to small adenomas removed, IC valve bx TV adenoma (Outlaw) 12/2012 - IC valve polyp removed [facility] - saline lift and cap method [facility])  Will obtain pathology report (2014) when he returns and he will have close f/u colonoscopy later this year

## 2014-04-24 NOTE — Progress Notes (Signed)
Subjective:    Patient ID: Erik Holmes., male    DOB: 02-24-1961, 53 y.o.   MRN: [id number]  HPI This man is here with his wife because of anal itching, rectal bleeding, fecal soiling and hemorrhoids. He has had these sxs for years. He also has a hx of colon polyps and wants follow-up for these.  He has tried OTC steroid creams and suppositories x years with limited success. Stools soft on metamucil and stool softener, does not strain, does not sit to stool for prolonged periods (not > 2 mins). 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  Also has anal tags. He saw Dr. Marcello Moores last year and conservative measures for anal itching were recommended.  Also c/ groin rash, sweats a lot. Has tried Lotromin, etc with partial results - usually clears but returns.  Also says that he is having a clear mucoid dc from penis when he wipes rectum and also had some diificulty initiating and maintaining an erection. If he gets an erection is able to successfully have intercourse and ejaculate.  Has had adenomatous colon polyps - (see below) and a problematic TV adenoma of ileocecal valve with last colonoscopy at [facility] 12/2012 with removal of 1 cm IC valve polyp using saline lift and cap assist. Do not have pathology.  GI ROS o/w negative  Allergies  Allergen Reactions  . Bee Venom Anaphylaxis  . Penicillins Anaphylaxis  . Codeine Other (See Comments)    Made pt feel like skin was crawling  . Xanax [Alprazolam]     Made him feel angry   Outpatient Prescriptions Prior to Visit  Medication Sig Dispense Refill  . diazepam (VALIUM) 5 MG tablet Take 1 tablet (5 mg total) by mouth every 6 (six) hours as needed.  30 tablet  0  . HYDROcodone Bitartrate ER 10 MG CP12 Take by mouth.      . venlafaxine (EFFEXOR-XR) 75 MG 24 hr capsule Take 225 mg by mouth daily.      . hydrocortisone (ANUSOL-HC) 2.5 % rectal cream Place rectally 2 (two) times daily.  30 g  0   No  facility-administered medications prior to visit.   Past Medical History  Diagnosis Date  . Degenerative disc disease, lumbar   . Anxiety   . Shortness of breath   . Sleep apnea     cpap  . RLS (restless legs syndrome)   . Gout   . Depression   . Tubular adenoma of colon     multiple, TV adenomas also  . GERD (gastroesophageal reflux disease)   . ED (erectile dysfunction)   . Internal hemorrhoids with prolapse, bleeding 04/24/2014   Past Surgical History  Procedure Laterality Date  . Spine surgery  2008    lumbar fusion  . Cardiac catheterization  1998    90's  neg  . Eye surgery    . Anterior lat lumbar fusion Left 04/30/2013    Procedure: ANTERIOR LATERAL LUMBAR FUSION 1 LEVEL;  Surgeon: Erline Levine, MD;  Location: [place] [facility];  Service: Neurosurgery;  Laterality: Left;  Lumbar three-four  Anterolateral decompression/fusion/percutaneous pedicle screws   . Lumbar percutaneous pedicle screw 1 level N/A 04/30/2013    Procedure: LUMBAR PERCUTANEOUS PEDICLE SCREW 1 LEVEL;  Surgeon: Erline Levine, MD;  Location: [place] [facility];  Service: Neurosurgery;  Laterality: N/A;  Lumbar three-four  Anterolateral decompression/fusion/percutaneous pedicle screws  . Colonoscopy  multiple  Dr. Cranston Neighbor   History   Social History  . Marital Status: Married    Spouse Name: N/A    Number of Children: 5  .     Occupational History  .  Unemployed   Social History Main Topics  . Smoking status: Former Smoker -- 2.00 packs/day for 40 years    Quit date: 11/18/2013  . Smokeless tobacco: Never Used  . Alcohol Use: No  . Drug Use: No  . Sexual Activity: Yes - wife   Other Topics Concern  . None   Social History Narrative   Married, 5 grown children   Retired/disabled Dealer   Family History  Problem Relation Age of Onset  . Cancer Mother   . Heart disease Father   . Breast cancer Mother   . Osteoporosis Mother        Review of Systems Chronic intermittent back pain,  sinus/allergies    Objective:   Physical Exam General:  Well-developed, well-nourished and in no acute distress Eyes:  anicteric. ENT:   Mouth and posterior pharynx free of lesions.  Neck:   supple w/o thyromegaly or mass.  Lungs: Clear to auscultation bilaterally. Heart:  S1S2, no rubs, murmurs, gallops. Abdomen:  soft, non-tender, no hepatosplenomegaly, hernia, or mass and BS+.  Rectal:  Anoderm inspection revealed 2 small anal tags RP and RA, no dermatitis Anal wink was absent Digital exam revealed normal resting tone and voluntary squeeze. No mass or rectocele present. Simulated defecation with valsalva revealed appropriate abdominal contraction and descent. Prostate normal   Anoscopy was performed with the patient in the left lateral decubitus position and revealed Grade 2-3 internal hemorrhoids in all positions  Lymph:  no cervical or supraclavicular adenopathy. Extremities:   no edema Skin   groin erythema Neuro:  A&O x 3.  Psych:  appropriate mood and  Affect.   Data Reviewed:  Prior colonoscopy reports and pathology  PROCEDURE NOTE: The patient presents with symptomatic grade 2-3  hemorrhoids, requesting rubber band ligation of his/her hemorrhoidal disease.  All risks, benefits and alternative forms of therapy were described and informed consent was obtained.   The decision was made to band the RP internal hemorrhoid, and the [place] was used to perform band ligation without complication.  Digital anorectal examination was then performed to assure proper positioning of the band, and to adjust the banded tissue as required.  The patient was discharged home without pain or other issues.  Dietary and behavioral recommendations were given and along with follow-up instructions.     The following adjunctive treatments were recommended:  Balneol prn to anal area  The patient will return in 2 weeks for  follow-up and possible additional banding as  required. No complications were encountered and the patient tolerated the procedure well.     Assessment & Plan:  Internal hemorrhoids with prolapse, bleeding Grade 2-3 all - RP banded today RTC 2 weeks and band 1-2 more Stay on softeners and fiber  Personal history of colonic polyps 04/29/2011 - diminutive tubular adenoma and serrated adenoma cecum removed, tubular adenoma bx IC valve, tubular adenoma 3 mm removed transverse colon (Outlaw) 10/29/2012 - 6 diminutive to small adenomas removed, IC valve bx TV adenoma (Outlaw) 12/2012 - IC valve polyp removed [facility] - saline lift and cap method [facility])  Will obtain pathology report (2014) when he returns and he will have close f/u colonoscopy later this year  Abnormal urinary stream/discharge Refer to GU for penile fluid leak/dc  when wiping rectum  ED (erectile dysfunction) Refer to GU  Dermatophytosis of groin and perianal area Zeasorb AF powder  Anal itch Balneol lotion as needed Hopefully hemorrhoid banding will resolve this    I appreciate the opportunity to care for this patient.  OM:AYOKHTX,HFSFSE R, MD, Dr. Eda Keys

## 2014-04-24 NOTE — Assessment & Plan Note (Signed)
Balneol lotion as needed Hopefully hemorrhoid banding will resolve this

## 2014-04-24 NOTE — Assessment & Plan Note (Addendum)
Refer to GU for penile fluid leak/dc when wiping rectum

## 2014-04-24 NOTE — Patient Instructions (Signed)
You have been given handouts to read on zeasorb and Balneol, these are both over the counter.  Please come back and see [place] June 18th at 1:45pm for your next banding.  HEMORRHOID BANDING PROCEDURE    FOLLOW-UP CARE   1. The procedure you have had should have been relatively painless since the banding of the area involved does not have nerve endings and there is no pain sensation.  The rubber band cuts off the blood supply to the hemorrhoid and the band may fall off as soon as 48 hours after the banding (the band may occasionally be seen in the toilet bowl following a bowel movement). You may notice a temporary feeling of fullness in the rectum which should respond adequately to plain Tylenol or Motrin.  2. Following the banding, avoid strenuous exercise that evening and resume full activity the next day.  A sitz bath (soaking in a warm tub) or bidet is soothing, and can be useful for cleansing the area after bowel movements.     3. To avoid constipation, take two tablespoons of natural wheat bran, natural oat bran, flax, Benefiber or any over the counter fiber supplement and increase your water intake to 7-8 glasses daily.    4. Unless you have been prescribed anorectal medication, do not put anything inside your rectum for two weeks: No suppositories, enemas, fingers, etc.  5. Occasionally, you may have more bleeding than usual after the banding procedure.  This is often from the untreated hemorrhoids rather than the treated one.  Don't be concerned if there is a tablespoon or so of blood.  If there is more blood than this, lie flat with your bottom higher than your head and apply an ice pack to the area. If the bleeding does not stop within a half an hour or if you feel faint, call our office at [phone] or go to the emergency room.  6. Problems are not common; however, if there is a substantial amount of bleeding, severe pain, chills, fever or difficulty passing urine (very rare) or  other problems, you should call us at [phone] or report to the nearest emergency room.  7. Do not stay seated continuously for more than 2-3 hours for a day or two after the procedure.  Tighten your buttock muscles 10-15 times every two hours and take 10-15 deep breaths every 1-2 hours.  Do not spend more than a few minutes on the toilet if you cannot empty your bowel; instead re-visit the toilet at a later time.    We have made you an appointment with Dr. Festus Aloe on 05/14/14 at 9:45 AM.  He is located across the street from [place] at [facility].   I appreciate the opportunity to care for you.

## 2014-05-08 ENCOUNTER — Ambulatory Visit (INDEPENDENT_AMBULATORY_CARE_PROVIDER_SITE_OTHER): Payer: 59 | Admitting: Internal Medicine

## 2014-05-08 ENCOUNTER — Encounter: Payer: Self-pay | Admitting: Internal Medicine

## 2014-05-08 VITALS — BP 110/60 | HR 66 | Ht 69.0 in | Wt 260.8 lb

## 2014-05-08 DIAGNOSIS — K648 Other hemorrhoids: Secondary | ICD-10-CM

## 2014-05-08 DIAGNOSIS — Z86010 Personal history of colonic polyps: Secondary | ICD-10-CM

## 2014-05-08 NOTE — Progress Notes (Signed)
Patient ID: Erik Chute., male   DOB: 11/29/1960, 53 y.o.   MRN: [id number]         PROCEDURE NOTE: The patient presents with symptomatic grade 2-3  hemorrhoids, requesting rubber band ligation of his/her hemorrhoidal disease.  All risks, benefits and alternative forms of therapy were described and informed consent was obtained.  Erik Holmes, [place]  present  He is improved somewhat after banding of RP hemorrhoid 2 weeks ago - less seepage and itching. Zeasorb AF helping grin rash.  He has a small RP tag and smaller RA tags.  The decision was made to band the LL and RA  internal hemorrhoids, and the [place] was used to perform band ligation without complication.  Digital anorectal examination was then performed to assure proper positioning of the band, and to adjust the banded tissue as required. The patient was discharged home without pain or other issues.  Dietary and behavioral recommendations were given and along with follow-up instructions.     The following adjunctive treatments were recommended:  Balneol prn anal area  The patient will return in 2 months for  follow-up and possible additional banding as required. No complications were encountered and the patient tolerated the procedure well.  Will plan for repeat colonoscopy to f/u adenomas later 2015.  I appreciate the opportunity to care for you.   QI:[id number],JJHERD R, MD

## 2014-05-08 NOTE — Assessment & Plan Note (Signed)
RA and LL banded RTC 2 months 

## 2014-05-08 NOTE — Patient Instructions (Signed)
HEMORRHOID BANDING PROCEDURE    FOLLOW-UP CARE   1. The procedure you have had should have been relatively painless since the banding of the area involved does not have nerve endings and there is no pain sensation.  The rubber band cuts off the blood supply to the hemorrhoid and the band may fall off as soon as 48 hours after the banding (the band may occasionally be seen in the toilet bowl following a bowel movement). You may notice a temporary feeling of fullness in the rectum which should respond adequately to plain Tylenol or Motrin.  2. Following the banding, avoid strenuous exercise that evening and resume full activity the next day.  A sitz bath (soaking in a warm tub) or bidet is soothing, and can be useful for cleansing the area after bowel movements.     3. To avoid constipation, take two tablespoons of natural wheat bran, natural oat bran, flax, Benefiber or any over the counter fiber supplement and increase your water intake to 7-8 glasses daily.    4. Unless you have been prescribed anorectal medication, do not put anything inside your rectum for two weeks: No suppositories, enemas, fingers, etc.  5. Occasionally, you may have more bleeding than usual after the banding procedure.  This is often from the untreated hemorrhoids rather than the treated one.  Don't be concerned if there is a tablespoon or so of blood.  If there is more blood than this, lie flat with your bottom higher than your head and apply an ice pack to the area. If the bleeding does not stop within a half an hour or if you feel faint, call our office at [phone] or go to the emergency room.  6. Problems are not common; however, if there is a substantial amount of bleeding, severe pain, chills, fever or difficulty passing urine (very rare) or other problems, you should call us at [phone] or report to the nearest emergency room.  7. Do not stay seated continuously for more than 2-3 hours for a day or two  after the procedure.  Tighten your buttock muscles 10-15 times every two hours and take 10-15 deep breaths every 1-2 hours.  Do not spend more than a few minutes on the toilet if you cannot empty your bowel; instead re-visit the toilet at a later time.   Please  Follow up with [place] in 2 months.   I appreciate the opportunity to care for you.

## 2014-05-08 NOTE — Assessment & Plan Note (Signed)
Plan for repeat colonoscopy later this year - f/u hemorrhoids first

## 2014-07-11 ENCOUNTER — Encounter: Payer: Self-pay | Admitting: Internal Medicine

## 2014-07-11 ENCOUNTER — Ambulatory Visit (INDEPENDENT_AMBULATORY_CARE_PROVIDER_SITE_OTHER): Payer: 59 | Admitting: Internal Medicine

## 2014-07-11 VITALS — BP 136/88 | HR 84 | Ht 68.0 in | Wt 244.4 lb

## 2014-07-11 DIAGNOSIS — K648 Other hemorrhoids: Secondary | ICD-10-CM

## 2014-07-11 DIAGNOSIS — K644 Residual hemorrhoidal skin tags: Secondary | ICD-10-CM

## 2014-07-11 NOTE — Patient Instructions (Addendum)
HEMORRHOID BANDING PROCEDURE    FOLLOW-UP CARE   1. The procedure you have had should have been relatively painless since the banding of the area involved does not have nerve endings and there is no pain sensation.  The rubber band cuts off the blood supply to the hemorrhoid and the band may fall off as soon as 48 hours after the banding (the band may occasionally be seen in the toilet bowl following a bowel movement). You may notice a temporary feeling of fullness in the rectum which should respond adequately to plain Tylenol or Motrin.  2. Following the banding, avoid strenuous exercise that evening and resume full activity the next day.  A sitz bath (soaking in a warm tub) or bidet is soothing, and can be useful for cleansing the area after bowel movements.     3. To avoid constipation, take two tablespoons of natural wheat bran, natural oat bran, flax, Benefiber or any over the counter fiber supplement and increase your water intake to 7-8 glasses daily.    4. Unless you have been prescribed anorectal medication, do not put anything inside your rectum for two weeks: No suppositories, enemas, fingers, etc.  5. Occasionally, you may have more bleeding than usual after the banding procedure.  This is often from the untreated hemorrhoids rather than the treated one.  Don't be concerned if there is a tablespoon or so of blood.  If there is more blood than this, lie flat with your bottom higher than your head and apply an ice pack to the area. If the bleeding does not stop within a half an hour or if you feel faint, call our office at [phone] or go to the emergency room.  6. Problems are not common; however, if there is a substantial amount of bleeding, severe pain, chills, fever or difficulty passing urine (very rare) or other problems, you should call us at [phone] or report to the nearest emergency room.  7. Do not stay seated continuously for more than 2-3 hours for a day or two  after the procedure.  Tighten your buttock muscles 10-15 times every two hours and take 10-15 deep breaths every 1-2 hours.  Do not spend more than a few minutes on the toilet if you cannot empty your bowel; instead re-visit the toilet at a later time.    Please get some antibotic ointment to apply to the skin tag several times a day.  Follow up with [place] at your next appointment 07/30/14 at 2;30PM.  I appreciate the opportunity to care for you.    I appreciate the opportunity to care for you.

## 2014-07-11 NOTE — Assessment & Plan Note (Addendum)
Right tag banded His request Understood new procedure and possibility of infection Apply triple Abx ointment

## 2014-07-11 NOTE — Assessment & Plan Note (Addendum)
LL banded for second time today

## 2014-07-11 NOTE — Progress Notes (Signed)
Patient ID: Erik Chute., male   DOB: Sep 19, 1961, 53 y.o.   MRN: [id number]        He patient is here for f/u 2 months after banding of hemorrhoids with 75% improvement in sxs. He is requesting repeat banding and would like me to fix his skin tag also.  PROCEDURE NOTE: The patient presents with symptomatic hemorrhoids, and an anal skin tag requesting rubber band ligation of his/her hemorrhoidal disease and the skin tag.  All risks, benefits and alternative forms of therapy were described and informed consent was obtained. I explained that I thought reasonable to band his small skin tag but could not guarantee success and that there were risks of pain, bleeding and infection.  Rectal showed small right anterior anal tag. No masses.  PROCEDURE NOTE:  In the Left Lateral Decubitus position anoscopic examination revealed grade 1-2 hemorrhoids in the LL and RP position(s).   The patient presents with symptomatic grade 1-2  hemorrhoids, requesting rubber band ligation of his/her hemorrhoidal disease.  All risks, benefits and alternative forms of therapy were described and informed consent was obtained.  The small RA anal skin tag was seen and a ligating band placed without difficulty or pain.  The decision was made to band the LL internal hemorrhoid, and the [place] was used to perform band ligation without complication.  Digital anorectal examination was then performed to assure proper positioning of the band, and to adjust the banded tissue as required.  The patient was discharged home without pain or other issues.  Dietary and behavioral recommendations were given and along with follow-up instructions.     The following adjunctive treatments were recommended:  Triple antibiotic cream or ointment to anal tag that was banded  The patient will return in 2-4 weeks for  follow-up and possible additional banding as required. No complications were encountered and the patient  tolerated the procedure well.

## 2014-07-29 ENCOUNTER — Ambulatory Visit (INDEPENDENT_AMBULATORY_CARE_PROVIDER_SITE_OTHER): Payer: 59 | Admitting: Diagnostic Neuroimaging

## 2014-07-29 ENCOUNTER — Encounter: Payer: Self-pay | Admitting: Diagnostic Neuroimaging

## 2014-07-29 VITALS — BP 115/72 | HR 66 | Temp 97.8°F | Ht 69.0 in | Wt 246.0 lb

## 2014-07-29 DIAGNOSIS — R413 Other amnesia: Secondary | ICD-10-CM

## 2014-07-29 DIAGNOSIS — Z87820 Personal history of traumatic brain injury: Secondary | ICD-10-CM

## 2014-07-29 DIAGNOSIS — F329 Major depressive disorder, single episode, unspecified: Secondary | ICD-10-CM

## 2014-07-29 DIAGNOSIS — F411 Generalized anxiety disorder: Secondary | ICD-10-CM

## 2014-07-29 DIAGNOSIS — F32A Depression, unspecified: Secondary | ICD-10-CM | POA: Insufficient documentation

## 2014-07-29 DIAGNOSIS — F3289 Other specified depressive episodes: Secondary | ICD-10-CM

## 2014-07-29 NOTE — Patient Instructions (Signed)
I will check MRI and lab testing.  Consider psychiatry/psychology evaluation of depression/anxiety.

## 2014-07-29 NOTE — Progress Notes (Signed)
[facility]  PATIENT: Erik Holmes. DOB: 1960/12/09  REFERRING CLINICIAN: Vertell Limber HISTORY FROM: patient  REASON FOR VISIT: new consult    HISTORICAL  CHIEF COMPLAINT:  Chief Complaint  Patient presents with  . Memory Loss    confusion    HISTORY OF PRESENT ILLNESS:   53 year old white male here for evaluation of memory loss.  Patient reports two-year history of mild memory loss. In the last 6 months this has significantly worsened. Patient describes short-term memory problems, forgetting tasks, where he put objects, what he was doing, getting lost while driving, word finding difficulties and stuttering. Patient's wife has noticed this as well. Over the same timeframe patient's depression and anxiety has significantly worsened. He has 13 year history of depression anxiety, on Effexor and Valium. July 2014 patient became disabled due to low back pain. His depression significantly worsened at that time.  Patient also has history of sleep apnea, on CPAP treatment for past 5 years with good results. He does have some superimposed insomnia, possibly related to anxiety.  Patient also has had multiple head traumas in his life. These are greater than 10 pounds, some of which included loss of consciousness. In the last 2 years several of these occurred while standing up too quickly and hitting his head on mechanic lifts. One month ago he ran into a low lying tree branch and knocked himself unconscious.   REVIEW OF SYSTEMS: Full 14 system review of systems performed and notable only for depression and anxiety decreased energy change in appetite disinterest in activities nevertheless confusion numbness weakness restless legs.  ALLERGIES: Allergies  Allergen Reactions  . Bee Venom Anaphylaxis  . Penicillins Anaphylaxis  . Codeine Other (See Comments)    Made pt feel like skin was crawling  . Xanax [Alprazolam]     Made him feel angry    HOME  MEDICATIONS: Outpatient Prescriptions Prior to Visit  Medication Sig Dispense Refill  . diazepam (VALIUM) 5 MG tablet Take 1 tablet (5 mg total) by mouth every 6 (six) hours as needed.  30 tablet  0  . phenylephrine-shark liver oil-mineral oil-petrolatum (PREPARATION H) 0.25-3-14-71.9 % rectal ointment Place 1 application rectally as needed for hemorrhoids.      Marland Kitchen PRESCRIPTION MEDICATION HYDROCODONE BITARTRATE IR 10 MG, USE AS NEEDED      . temazepam (RESTORIL) 15 MG capsule Take 15 mg by mouth at bedtime as needed for sleep.      Marland Kitchen venlafaxine (EFFEXOR-XR) 75 MG 24 hr capsule Take 225 mg by mouth daily.       No facility-administered medications prior to visit.    PAST MEDICAL HISTORY: Past Medical History  Diagnosis Date  . Degenerative disc disease, lumbar   . Anxiety   . Shortness of breath   . Sleep apnea     cpap  . RLS (restless legs syndrome)   . Gout   . Depression   . Tubular adenoma of colon     multiple, TV adenomas also  . GERD (gastroesophageal reflux disease)   . ED (erectile dysfunction)   . Internal hemorrhoids with prolapse, bleeding 04/24/2014    PAST SURGICAL HISTORY: Past Surgical History  Procedure Laterality Date  . Spine surgery  2008    lumbar fusion  . Cardiac catheterization  1998    90's  neg  . Eye surgery    . Anterior lat lumbar fusion Left 04/30/2013    Procedure: ANTERIOR LATERAL LUMBAR FUSION 1 LEVEL;  Surgeon: Broadus John  Vertell Limber, MD;  Location: [facility];  Service: Neurosurgery;  Laterality: Left;  Lumbar three-four  Anterolateral decompression/fusion/percutaneous pedicle screws   . Lumbar percutaneous pedicle screw 1 level N/A 04/30/2013    Procedure: LUMBAR PERCUTANEOUS PEDICLE SCREW 1 LEVEL;  Surgeon: Erline Levine, MD;  Location: [facility];  Service: Neurosurgery;  Laterality: N/A;  Lumbar three-four  Anterolateral decompression/fusion/percutaneous pedicle screws  . Colonoscopy  multiple    Dr. Cranston Neighbor  . Hemorrhoid banding  2015     FAMILY HISTORY: Family History  Problem Relation Age of Onset  . Cancer Mother   . Heart disease Father   . Breast cancer Mother   . Osteoporosis Mother     SOCIAL HISTORY:  History   Social History  . Marital Status: Married    Spouse Name: [name]    Number of Children: 5  . Years of Education: 12th   Occupational History  .  Unemployed   Social History Main Topics  . Smoking status: Former Smoker -- 2.00 packs/day for 40 years    Quit date: 11/18/2013  . Smokeless tobacco: Never Used  . Alcohol Use: No  . Drug Use: No  . Sexual Activity: Not on file   Other Topics Concern  . 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   Caffeine use: 3 cups daily     PHYSICAL EXAM  Filed Vitals:   07/29/14 0957  BP: 115/72  Pulse: 66  Temp: 97.8 F (36.6 C)  TempSrc: Oral  Height: 5\' 9"  (1.753 m)  Weight: 246 lb (111.585 kg)    Not recorded    Body mass index is 36.31 kg/(m^2).  GENERAL EXAM: Patient is in no distress; well developed, nourished and groomed; neck is supple  CARDIOVASCULAR: Regular rate and rhythm, no murmurs, no carotid bruits  NEUROLOGIC: MENTAL STATUS: awake, alert, oriented to person, place and time, recent and remote memory intact, normal attention and concentration, language fluent, comprehension intact, naming intact, fund of knowledge appropriate; MMSE 27/30; [place] 18/30. NO FRONTAL RELEASE SIGNS. CRANIAL NERVE: no papilledema on fundoscopic exam, pupils equal and reactive to light, visual fields full to confrontation, extraocular muscles intact, no nystagmus, facial sensation and strength symmetric, hearing intact, palate elevates symmetrically, uvula midline, shoulder shrug symmetric, tongue midline. MOTOR: normal bulk and tone, full strength in the BUE, BLE SENSORY: normal and symmetric to light touch, pinprick, temperature, vibration   COORDINATION: finger-nose-finger, fine finger movements normal REFLEXES: deep tendon reflexes present and symmetric; TRACE AT ANKLES GAIT/STATION: ANTALGIC GAIT; romberg is negative    DIAGNOSTIC DATA (LABS, IMAGING, TESTING) - I reviewed patient records, labs, notes, testing and imaging myself where available.  Lab Results  Component Value Date   WBC 9.5 04/22/2013   HGB 17.2* 04/22/2013   HCT 47.9 04/22/2013   MCV 88.7 04/22/2013   PLT 253 04/22/2013      Component Value Date/Time   NA 140 04/22/2013 1300   K 4.0 04/22/2013 1300   CL 102 04/22/2013 1300   CO2 27 04/22/2013 1300   GLUCOSE 121* 04/22/2013 1300   BUN 11 04/22/2013 1300   CREATININE 0.95 04/22/2013 1300   CALCIUM 9.1 04/22/2013 1300   PROT 6.8 01/26/2012 0915   ALBUMIN 3.7 01/26/2012 0915   AST 17 01/26/2012 0915   ALT 21 01/26/2012 0915   ALKPHOS 54 01/26/2012 0915   BILITOT 0.3 01/26/2012 0915  GFRNONAA >90 04/22/2013 1300   GFRAA >90 04/22/2013 1300   No results found for this basename: CHOL, HDL, LDLCALC, LDLDIRECT, TRIG, CHOLHDL   No results found for this basename: HGBA1C   No results found for this basename: VITAMINB12   No results found for this basename: TSH      ASSESSMENT AND PLAN  53 y.o. year old male here with memory loss for 2 years, worse in last 6 months. MMSE 27/30, MOCA 18/30.  Ddx: pseudo-dementia of depression, metabolic, post-traumatic, neurodegenerative, mild cognitive impairment  PLAN: - labs and MRI - consider psychiatry eval for mood disorder  Orders Placed This Encounter  Procedures  . MR Brain Wo Contrast  . Vitamin B12  . TSH   Return in about 3 months (around 10/28/2014).    Penni Bombard, MD 06/22/8002, 49:17 AM Certified in Neurology, Neurophysiology and Neuroimaging  [facility] [address] [place], [place] [address] [phone]

## 2014-07-30 ENCOUNTER — Encounter: Payer: Self-pay | Admitting: Internal Medicine

## 2014-07-30 ENCOUNTER — Ambulatory Visit (INDEPENDENT_AMBULATORY_CARE_PROVIDER_SITE_OTHER): Payer: 59 | Admitting: Internal Medicine

## 2014-07-30 VITALS — BP 136/80 | HR 80 | Ht 69.0 in | Wt 250.0 lb

## 2014-07-30 DIAGNOSIS — K648 Other hemorrhoids: Secondary | ICD-10-CM

## 2014-07-30 DIAGNOSIS — K644 Residual hemorrhoidal skin tags: Secondary | ICD-10-CM

## 2014-07-30 DIAGNOSIS — Z86010 Personal history of colonic polyps: Secondary | ICD-10-CM

## 2014-07-30 NOTE — Patient Instructions (Signed)
Please follow up with Dr. Carlean Purl on  09/29/2014 at 1:45pm

## 2014-07-30 NOTE — Assessment & Plan Note (Addendum)
RP and RA banded (second ties) because of persistent itching and swelling c/o.  RTC 2 months Should be ready to set up colonoscopy then

## 2014-07-30 NOTE — Assessment & Plan Note (Addendum)
Right tag removed now after successful banding.

## 2014-07-30 NOTE — Progress Notes (Signed)
Patient ID: Erik Holmes., male   DOB: [birth year], 53 y.o.   MRN: [id number]        PROCEDURE NOTE: The patient presents with symptomatic grade 2  hemorrhoids, requesting rubber band ligation of his/her hemorrhoidal disease.  All risks, benefits and alternative forms of therapy were described and informed consent was obtained. Rectal exam revealed that right anal skin tag was gone.  In the Left Lateral Decubitus position anoscopic examination revealed grade 2 hemorrhoids in the RA AND RP position(s).   The decision was made to band the RA AND RP internal hemorrhoidS, and the [place] was used to perform band ligation without complication.  Digital anorectal examination was then performed to assure proper positioning of the band, and to adjust the banded tissue as required.  The patient was discharged home without pain or other issues.  Dietary and behavioral recommendations were given and along with follow-up instructions.       The patient will return in 2 MONTHS for  follow-up and possible additional banding as required. No complications were encountered and the patient tolerated the procedure well.   Anal skin tags Right tag removed now after successful banding.  Internal hemorrhoids with prolapse, bleeding RP and RA banded (second ties) because of persistent itching and swelling c/o.  RTC 2 months Should be ready to set up colonoscopy then  Personal history of colonic polyps Colonoscopy to be scheduled after next visit 2 months from now     DU:[id number],CWCBJS R, MD

## 2014-07-31 NOTE — Assessment & Plan Note (Signed)
Colonoscopy to be scheduled after next visit 2 months from now

## 2014-09-10 ENCOUNTER — Other Ambulatory Visit: Payer: 59

## 2014-09-18 ENCOUNTER — Ambulatory Visit (INDEPENDENT_AMBULATORY_CARE_PROVIDER_SITE_OTHER): Payer: 59

## 2014-09-18 DIAGNOSIS — Z87820 Personal history of traumatic brain injury: Secondary | ICD-10-CM

## 2014-09-18 DIAGNOSIS — F329 Major depressive disorder, single episode, unspecified: Secondary | ICD-10-CM

## 2014-09-18 DIAGNOSIS — R413 Other amnesia: Secondary | ICD-10-CM

## 2014-09-18 DIAGNOSIS — F32A Depression, unspecified: Secondary | ICD-10-CM

## 2014-09-18 DIAGNOSIS — F411 Generalized anxiety disorder: Secondary | ICD-10-CM

## 2014-09-29 ENCOUNTER — Ambulatory Visit: Payer: 59 | Admitting: Internal Medicine

## 2014-09-30 ENCOUNTER — Encounter: Payer: Self-pay | Admitting: Internal Medicine

## 2014-10-27 ENCOUNTER — Ambulatory Visit (INDEPENDENT_AMBULATORY_CARE_PROVIDER_SITE_OTHER): Payer: 59 | Admitting: Diagnostic Neuroimaging

## 2014-10-27 ENCOUNTER — Encounter: Payer: Self-pay | Admitting: Diagnostic Neuroimaging

## 2014-10-27 VITALS — BP 138/92 | HR 92 | Temp 98.0°F | Ht 70.0 in | Wt 246.8 lb

## 2014-10-27 DIAGNOSIS — F332 Major depressive disorder, recurrent severe without psychotic features: Secondary | ICD-10-CM

## 2014-10-27 DIAGNOSIS — G40909 Epilepsy, unspecified, not intractable, without status epilepticus: Secondary | ICD-10-CM

## 2014-10-27 DIAGNOSIS — R413 Other amnesia: Secondary | ICD-10-CM

## 2014-10-27 NOTE — Patient Instructions (Signed)
I will check EEG.

## 2014-10-27 NOTE — Progress Notes (Signed)
[facility]  PATIENT: Erik Holmes. DOB: 1961/11/02  REFERRING CLINICIAN: Vertell Limber HISTORY FROM: patient and wife REASON FOR VISIT: follow up   HISTORICAL  CHIEF COMPLAINT:  Chief Complaint  Patient presents with  . Memory Loss  . Depression    HISTORY OF PRESENT ILLNESS:   UPDATE 10/27/14: Since last visit, patient not doing well. More memory problems, confusion, depression. Depression related to difficult divorce 15 years ago and estrangement from his children. He is seeing psychiatry now, and meds have been adjusted. Patient declined [place] testing. Patient walked out of exam room during evaluation.  PRIOR HPI (07/29/14): 53 year old white male here for evaluation of memory loss. Patient reports two-year history of mild memory loss. In the last 6 months this has significantly worsened. Patient describes short-term memory problems, forgetting tasks, where he put objects, what he was doing, getting lost while driving, word finding difficulties and stuttering. Patient's wife has noticed this as well. Over the same timeframe patient's depression and anxiety has significantly worsened. He has 13 year history of depression anxiety, on Effexor and Valium. July 2014 patient became disabled due to low back pain. His depression significantly worsened at that time. Patient also has history of sleep apnea, on CPAP treatment for past 5 years with good results. He does have some superimposed insomnia, possibly related to anxiety. Patient also has had multiple head traumas in his life. These are greater than 10 pounds, some of which included loss of consciousness. In the last 2 years several of these occurred while standing up too quickly and hitting his head on mechanic lifts. One month ago he ran into a low lying tree branch and knocked himself unconscious.   REVIEW OF SYSTEMS: Full 14 system review of systems performed and notable only for depression and anxiety decreased  energy change in appetite disinterest in activities confusion numbness weakness restless legs apnea pain in neck gait diff.    ALLERGIES: Allergies  Allergen Reactions  . Bee Venom Anaphylaxis  . Penicillins Anaphylaxis  . Codeine Other (See Comments)    Made pt feel like skin was crawling  . Xanax [Alprazolam]     Made him feel angry    HOME MEDICATIONS: Outpatient Prescriptions Prior to Visit  Medication Sig Dispense Refill  . diazepam (VALIUM) 5 MG tablet Take 1 tablet (5 mg total) by mouth every 6 (six) hours as needed. 30 tablet 0  . phenylephrine-shark liver oil-mineral oil-petrolatum (PREPARATION H) 0.25-3-14-71.9 % rectal ointment Place 1 application rectally as needed for hemorrhoids.    Marland Kitchen PRESCRIPTION MEDICATION HYDROCODONE BITARTRATE IR 10 MG, USE AS NEEDED    . temazepam (RESTORIL) 15 MG capsule Take 15 mg by mouth at bedtime as needed for sleep.    Marland Kitchen venlafaxine (EFFEXOR-XR) 75 MG 24 hr capsule Take 225 mg by mouth daily.     No facility-administered medications prior to visit.    PAST MEDICAL HISTORY: Past Medical History  Diagnosis Date  . Degenerative disc disease, lumbar   . Anxiety   . Shortness of breath   . Sleep apnea     cpap  . RLS (restless legs syndrome)   . Gout   . Depression   . Tubular adenoma of colon     multiple, TV adenomas also  . GERD (gastroesophageal reflux disease)   . ED (erectile dysfunction)   . Internal hemorrhoids with prolapse, bleeding 04/24/2014    PAST SURGICAL HISTORY: Past Surgical History  Procedure Laterality Date  . Spine surgery  2008    lumbar fusion  . Cardiac catheterization  1998    90's  neg  . Eye surgery    . Anterior lat lumbar fusion Left 04/30/2013    Procedure: ANTERIOR LATERAL LUMBAR FUSION 1 LEVEL;  Surgeon: Erline Levine, MD;  Location: [place] [facility];  Service: Neurosurgery;  Laterality: Left;  Lumbar three-four  Anterolateral decompression/fusion/percutaneous pedicle screws   . Lumbar percutaneous  pedicle screw 1 level N/A 04/30/2013    Procedure: LUMBAR PERCUTANEOUS PEDICLE SCREW 1 LEVEL;  Surgeon: Erline Levine, MD;  Location: [place] [facility];  Service: Neurosurgery;  Laterality: N/A;  Lumbar three-four  Anterolateral decompression/fusion/percutaneous pedicle screws  . Colonoscopy  multiple    Dr. Cranston Neighbor  . Hemorrhoid banding  2015    FAMILY HISTORY: Family History  Problem Relation Age of Onset  . Cancer Mother   . Heart disease Father   . Breast cancer Mother   . Osteoporosis Mother     SOCIAL HISTORY:  History   Social History  . Marital Status: Married    Spouse Name: [name]    Number of Children: 5  . Years of Education: 12th   Occupational History  .  Unemployed   Social History Main Topics  . Smoking status: Former Smoker -- 2.00 packs/day for 40 years    Quit date: 11/18/2013  . Smokeless tobacco: Never Used  . Alcohol Use: No  . Drug Use: No  . Sexual Activity: Not on file   Other Topics Concern  . 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   Caffeine use: 3 cups daily     PHYSICAL EXAM  Filed Vitals:   10/27/14 1340  BP: 138/92  Pulse: 92  Temp: 98 F (36.7 C)  TempSrc: Oral  Height: 5\' 10"  (1.778 m)  Weight: 246 lb 12.8 oz (111.948 kg)    Not recorded      Body mass index is 35.41 kg/(m^2).  GENERAL EXAM: SEVERE DEPRESSED AFFECT. PATIENT LEFT EXAM ROOM BEFORE EXAM COULD BE COMPLETED.    DIAGNOSTIC DATA (LABS, IMAGING, TESTING) - I reviewed patient records, labs, notes, testing and imaging myself where available.  Lab Results  Component Value Date   WBC 9.5 04/22/2013   HGB 17.2* 04/22/2013   HCT 47.9 04/22/2013   MCV 88.7 04/22/2013   PLT 253 04/22/2013      Component Value Date/Time   NA 140 04/22/2013 1300   K 4.0 04/22/2013 1300   CL 102 04/22/2013 1300   CO2 27 04/22/2013 1300   GLUCOSE 121* 04/22/2013 1300   BUN  11 04/22/2013 1300   CREATININE 0.95 04/22/2013 1300   CALCIUM 9.1 04/22/2013 1300   PROT 6.8 01/26/2012 0915   ALBUMIN 3.7 01/26/2012 0915   AST 17 01/26/2012 0915   ALT 21 01/26/2012 0915   ALKPHOS 54 01/26/2012 0915   BILITOT 0.3 01/26/2012 0915   GFRNONAA >90 04/22/2013 1300   GFRAA >90 04/22/2013 1300   No results found for: CHOL No results found for: HGBA1C No results found for: VITAMINB12 No results found for: TSH   09/18/14 MRI BRAIN demonstrating: 1. Small focus of gliosis and hemosiderin/mineralization in the right head of caudate nucleus. May represent chronic ischemic infarction. Sequelae from prior trauma, infection or inflammation less likely.  2. No acute findings.     ASSESSMENT AND PLAN  53 y.o. year old male  here with memory loss since 2013, in setting of uncontrolled depression and pain. Also with remote seizure disorder, now with breakthrough sz in Nov 2015.   07/29/14 MMSE 27/30, MOCA 18/30.  Dx: pseudo-dementia of depression + seizure disorder   PLAN: - check EEG - no driving (recent seizure; also with confusion, memory loss and severe depression) - will ask psychiatry about starting lamotrigine or depakote for seizure control and mood stabilization - continue psychiatry eval for mood disorder  Orders Placed This Encounter  Procedures  . EEG adult   Return in about 6 months (around 04/28/2015).    Penni Bombard, MD 85/06/8501, 7:74 PM Certified in Neurology, Neurophysiology and Neuroimaging  [facility] [address], [place], [place] [address] [phone]

## 2014-10-28 ENCOUNTER — Ambulatory Visit: Payer: 59 | Admitting: Diagnostic Neuroimaging

## 2014-11-11 NOTE — Procedures (Signed)
   [facility]  EEG (ELECTROENCEPHALOGRAM) REPORT   STUDY DATE: 10/27/14  PATIENT NAME: Erik Holmes. DOB: [birth year] MRN: [id number]  ORDERING CLINICIAN: Andrey Spearman, MD  TECHNOLOGIST: Towana Badger  TECHNIQUE: Electroencephalogram was recorded utilizing standard 10-20 system of lead placement and reformatted into average and bipolar montages.  RECORDING TIME: 30 minutes ACTIVATION: hyperventilation and photic stimulation  CLINICAL INFORMATION: 53 year old male with memory and confusion.  FINDINGS: Background rhythms of 12-13 hertz and 20-30 microvolts. No focal, lateralizing, epileptiform activity or seizures are seen. Patient recorded in the awake and drowsy state. EKG channel shows 72 beats per minute. During photic stimulation there is blink artifact. Following photic stimulation there is normal brain wave activity.  IMPRESSION:  Normal EEG in the awake state.    INTERPRETING PHYSICIAN:  Penni Bombard, MD Certified in Neurology, Neurophysiology and Neuroimaging  [facility] [address] [place], [place] [address] [phone]

## 2015-01-30 DIAGNOSIS — Z0289 Encounter for other administrative examinations: Secondary | ICD-10-CM

## 2015-04-28 ENCOUNTER — Ambulatory Visit (INDEPENDENT_AMBULATORY_CARE_PROVIDER_SITE_OTHER): Payer: 59 | Admitting: Diagnostic Neuroimaging

## 2015-04-28 ENCOUNTER — Encounter: Payer: Self-pay | Admitting: Diagnostic Neuroimaging

## 2015-04-28 VITALS — BP 122/80 | HR 68 | Ht 70.0 in | Wt 245.8 lb

## 2015-04-28 DIAGNOSIS — R413 Other amnesia: Secondary | ICD-10-CM | POA: Diagnosis not present

## 2015-04-28 DIAGNOSIS — G40909 Epilepsy, unspecified, not intractable, without status epilepticus: Secondary | ICD-10-CM | POA: Diagnosis not present

## 2015-04-28 DIAGNOSIS — F32A Depression, unspecified: Secondary | ICD-10-CM

## 2015-04-28 DIAGNOSIS — F329 Major depressive disorder, single episode, unspecified: Secondary | ICD-10-CM | POA: Diagnosis not present

## 2015-04-28 NOTE — Progress Notes (Signed)
[facility]  PATIENT: Erik Holmes. DOB: 23-Jun-1961  REFERRING CLINICIAN: Vertell Limber HISTORY FROM: patient and wife REASON FOR VISIT: follow up   HISTORICAL  CHIEF COMPLAINT:  Chief Complaint  Patient presents with  . Follow-up    balanace issues and issues with walking     HISTORY OF PRESENT ILLNESS:   UPDATE 04/28/15: Since last visit, patient is more stable from mood standpoint; memory is still affected. Balance poor.  Also had seizures age [age]yrs old, was on phenobarb and dilantin. Last sz in life was Nov 2015, no trigger, generalized convulsions, LOC; no tongue biting, no incontinence. 1-2 hrs post-ictal confusion.   UPDATE 10/27/14: Since last visit, patient not doing well. More memory problems, confusion, depression. Depression related to difficult divorce 15 years ago and estrangement from his children. He is seeing psychiatry now, and meds have been adjusted. Patient declined [place] testing. Patient walked out of exam room during evaluation.  PRIOR HPI (07/29/14): 54 year old white male here for evaluation of memory loss. Patient reports two-year history of mild memory loss. In the last 6 months this has significantly worsened. Patient describes short-term memory problems, forgetting tasks, where he put objects, what he was doing, getting lost while driving, word finding difficulties and stuttering. Patient's wife has noticed this as well. Over the same timeframe patient's depression and anxiety has significantly worsened. He has 13 year history of depression anxiety, on Effexor and Valium. July 2014 patient became disabled due to low back pain. His depression significantly worsened at that time. Patient also has history of sleep apnea, on CPAP treatment for past 5 years with good results. He does have some superimposed insomnia, possibly related to anxiety. Patient also has had multiple head traumas in his life. These are greater than 10 pounds, some of which  included loss of consciousness. In the last 2 years several of these occurred while standing up too quickly and hitting his head on mechanic lifts. One month ago he ran into a low lying tree branch and knocked himself unconscious.   REVIEW OF SYSTEMS: Full 14 system review of systems performed and notable only for agitation memory loss depression and anxiety decreased energy change in appetite disinterest in activities confusion numbness weakness restless legs apnea pain in neck gait diff.    ALLERGIES: Allergies  Allergen Reactions  . Bee Venom Anaphylaxis  . Penicillins Anaphylaxis  . Codeine Other (See Comments)    Made pt feel like skin was crawling  . Xanax [Alprazolam]     Made him feel angry    HOME MEDICATIONS: Outpatient Prescriptions Prior to Visit  Medication Sig Dispense Refill  . diazepam (VALIUM) 5 MG tablet Take 1 tablet (5 mg total) by mouth every 6 (six) hours as needed. 30 tablet 0  . nabumetone (RELAFEN) 500 MG tablet Take 500 mg by mouth daily.  0  . phenylephrine-shark liver oil-mineral oil-petrolatum (PREPARATION H) 0.25-3-14-71.9 % rectal ointment Place 1 application rectally as needed for hemorrhoids.    . temazepam (RESTORIL) 15 MG capsule Take 15 mg by mouth at bedtime as needed for sleep.    Marland Kitchen venlafaxine (EFFEXOR-XR) 75 MG 24 hr capsule Take 225 mg by mouth daily.    . Oxycodone HCl 10 MG TABS Take 1 tablet by mouth as needed.  0  . PRESCRIPTION MEDICATION HYDROCODONE BITARTRATE IR 10 MG, USE AS NEEDED     No facility-administered medications prior to visit.    PAST MEDICAL HISTORY: Past Medical History  Diagnosis Date  .  Degenerative disc disease, lumbar   . Anxiety   . Shortness of breath   . Sleep apnea     cpap  . RLS (restless legs syndrome)   . Gout   . Depression   . Tubular adenoma of colon     multiple, TV adenomas also  . GERD (gastroesophageal reflux disease)   . ED (erectile dysfunction)   . Internal hemorrhoids with prolapse,  bleeding 04/24/2014    PAST SURGICAL HISTORY: Past Surgical History  Procedure Laterality Date  . Spine surgery  2008    lumbar fusion  . Cardiac catheterization  1998    90's  neg  . Eye surgery    . Anterior lat lumbar fusion Left 04/30/2013    Procedure: ANTERIOR LATERAL LUMBAR FUSION 1 LEVEL;  Surgeon: Erline Levine, MD;  Location: [place] [facility];  Service: Neurosurgery;  Laterality: Left;  Lumbar three-four  Anterolateral decompression/fusion/percutaneous pedicle screws   . Lumbar percutaneous pedicle screw 1 level N/A 04/30/2013    Procedure: LUMBAR PERCUTANEOUS PEDICLE SCREW 1 LEVEL;  Surgeon: Erline Levine, MD;  Location: [place] [facility];  Service: Neurosurgery;  Laterality: N/A;  Lumbar three-four  Anterolateral decompression/fusion/percutaneous pedicle screws  . Colonoscopy  multiple    Dr. Cranston Neighbor  . Hemorrhoid banding  2015    FAMILY HISTORY: Family History  Problem Relation Age of Onset  . Cancer Mother   . Heart disease Father   . Breast cancer Mother   . Osteoporosis Mother     SOCIAL HISTORY:  History   Social History  . Marital Status: Married    Spouse Name: [name]  . Number of Children: 5  . Years of Education: 12th   Occupational History  .  Unemployed   Social History Main Topics  . Smoking status: Former Smoker -- 2.00 packs/day for 40 years    Quit date: 11/18/2013  . Smokeless tobacco: Never Used  . Alcohol Use: No  . Drug Use: No  . Sexual Activity: Not on file   Other Topics Concern  . 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   Caffeine use: 2 cups daily     PHYSICAL EXAM  Filed Vitals:   04/28/15 1432  BP: 122/80  Pulse: 68  Height: 5\' 10"  (1.778 m)  Weight: 245 lb 12.8 oz (111.494 kg)    Not recorded      Body mass index is 35.27 kg/(m^2).  GENERAL EXAM: Patient is in no distress; well developed, nourished and groomed; neck  is supple  CARDIOVASCULAR: Regular rate and rhythm, no murmurs, no carotid bruits  NEUROLOGIC: MENTAL STATUS: awake, alert, language fluent, comprehension intact, naming intact, fund of knowledge appropriate; PSYCHOMOTOR SLOWING CRANIAL NERVE: pupils equal and reactive to light, visual fields full to confrontation, extraocular muscles intact, no nystagmus, facial sensation and strength symmetric, hearing intact, palate elevates symmetrically, uvula midline, shoulder shrug symmetric, tongue midline. MOTOR: normal bulk and tone, full strength in the BUE, BLE SENSORY: normal and symmetric to light touch, pinprick, temperature, vibration and proprioception COORDINATION: finger-nose-finger, fine finger movements, heel-shin normal REFLEXES: deep tendon reflexes present and symmetric GAIT/STATION: narrow based gait; able to walk on toes, heels and tandem; romberg is negative   DIAGNOSTIC DATA (LABS, IMAGING, TESTING) - I reviewed patient records, labs, notes, testing and imaging myself where available.  Lab Results  Component Value Date   WBC 9.5 04/22/2013  HGB 17.2* 04/22/2013   HCT 47.9 04/22/2013   MCV 88.7 04/22/2013   PLT 253 04/22/2013      Component Value Date/Time   NA 140 04/22/2013 1300   K 4.0 04/22/2013 1300   CL 102 04/22/2013 1300   CO2 27 04/22/2013 1300   GLUCOSE 121* 04/22/2013 1300   BUN 11 04/22/2013 1300   CREATININE 0.95 04/22/2013 1300   CALCIUM 9.1 04/22/2013 1300   PROT 6.8 01/26/2012 0915   ALBUMIN 3.7 01/26/2012 0915   AST 17 01/26/2012 0915   ALT 21 01/26/2012 0915   ALKPHOS 54 01/26/2012 0915   BILITOT 0.3 01/26/2012 0915   GFRNONAA >90 04/22/2013 1300   GFRAA >90 04/22/2013 1300   No results found for: CHOL No results found for: HGBA1C No results found for: VITAMINB12 No results found for: TSH   09/18/14 MRI BRAIN demonstrating: 1. Small focus of gliosis and hemosiderin/mineralization in the right head of caudate nucleus. May represent  chronic ischemic infarction. Sequelae from prior trauma, infection or inflammation less likely.  2. No acute findings.  10/27/14 EEG - normal     ASSESSMENT AND PLAN  54 y.o. year old male here with memory loss since 2013, in setting of uncontrolled depression and pain. Also with remote seizure disorder, now with breakthrough sz in Nov 2015.   07/29/14 MMSE 27/30, MOCA 18/30.  Dx: pseudo-dementia of depression + seizure disorder + lumbago and gait difficulty   PLAN: - no driving for now (due to confusion, memory loss, severe depression, back pain, seizure d/o) - will ask psychiatry about starting lamotrigine or depakote for seizure control and mood stabilization - continue psychiatry eval for mood disorder  Return in about 6 months (around 10/28/2015).  I spent 15 minutes of face to face time with patient. Greater than 50% of time was spent in counseling and coordination of care with patient.     Penni Bombard, MD 07/22/9165, 0:60 PM Certified in Neurology, Neurophysiology and Neuroimaging  [facility] [address], [place], [place] [address] [phone]

## 2015-11-09 ENCOUNTER — Ambulatory Visit: Payer: 59 | Admitting: Diagnostic Neuroimaging

## 2015-11-10 ENCOUNTER — Encounter: Payer: Self-pay | Admitting: Diagnostic Neuroimaging

## 2016-05-09 ENCOUNTER — Encounter: Payer: Self-pay | Admitting: Family Medicine

## 2017-01-09 DIAGNOSIS — M170 Bilateral primary osteoarthritis of knee: Secondary | ICD-10-CM | POA: Diagnosis not present

## 2017-01-17 DIAGNOSIS — Z01810 Encounter for preprocedural cardiovascular examination: Secondary | ICD-10-CM | POA: Diagnosis not present

## 2017-01-17 DIAGNOSIS — M1711 Unilateral primary osteoarthritis, right knee: Secondary | ICD-10-CM | POA: Diagnosis not present

## 2017-01-23 ENCOUNTER — Ambulatory Visit: Payer: Self-pay | Admitting: Orthopedic Surgery

## 2017-01-24 DIAGNOSIS — F332 Major depressive disorder, recurrent severe without psychotic features: Secondary | ICD-10-CM | POA: Diagnosis not present

## 2017-01-25 NOTE — H&P (Signed)
TOTAL KNEE ADMISSION H&P  Patient is being admitted for right total knee arthroplasty.  Subjective:  Chief Complaint:right knee pain.  HPI: Erik Holmes., 56 y.o. male, has a history of pain and functional disability in the right knee due to arthritis and has failed non-surgical conservative treatments for greater than 12 weeks to includeNSAID's and/or analgesics, corticosteriod injections, flexibility and strengthening excercises, use of assistive devices and activity modification.  Onset of symptoms was gradual, starting 8 years ago with gradually worsening course since that time. The patient noted prior procedures on the knee to include  arthroscopy on the right knee(s).  Patient currently rates pain in the right knee(s) at 7 out of 10 with activity. Patient has worsening of pain with activity and weight bearing, pain that interferes with activities of daily living and pain with passive range of motion.  Patient has evidence of subchondral sclerosis and joint space narrowing by imaging studies. There is no active infection.  Patient Active Problem List   Diagnosis Date Noted  . Memory loss 07/29/2014  . History of multiple concussions 07/29/2014  . Depression 07/29/2014  . Anxiety state, unspecified 07/29/2014  . Internal hemorrhoids with prolapse, bleeding 04/24/2014  . Personal history of colonic polyps 04/24/2014  . Anal skin tags 04/24/2014  . ED (erectile dysfunction) 04/24/2014  . Dermatophytosis of groin and perianal area 04/24/2014  . Anal itch 04/24/2014  . Abnormal urinary stream/discharge 04/24/2014  . Degenerative disc disease, lumbar    Past Medical History:  Diagnosis Date  . Anxiety   . Degenerative disc disease, lumbar   . Depression   . ED (erectile dysfunction)   . GERD (gastroesophageal reflux disease)   . Gout   . Internal hemorrhoids with prolapse, bleeding 04/24/2014  . RLS (restless legs syndrome)   . Shortness of breath   . Sleep apnea    cpap  .  Tubular adenoma of colon    multiple, TV adenomas also    Past Surgical History:  Procedure Laterality Date  . ANTERIOR LAT LUMBAR FUSION Left 04/30/2013   Procedure: ANTERIOR LATERAL LUMBAR FUSION 1 LEVEL;  Surgeon: Erline Levine, MD;  Location: [place] [facility];  Service: Neurosurgery;  Laterality: Left;  Lumbar three-four  Anterolateral decompression/fusion/percutaneous pedicle screws   . [place]   90's  neg  . COLONOSCOPY  multiple   Dr. Cranston Neighbor  . EYE SURGERY    . hemorrhoid banding  2015  . LUMBAR PERCUTANEOUS PEDICLE SCREW 1 LEVEL N/A 04/30/2013   Procedure: LUMBAR PERCUTANEOUS PEDICLE SCREW 1 LEVEL;  Surgeon: Erline Levine, MD;  Location: [place] [facility];  Service: Neurosurgery;  Laterality: N/A;  Lumbar three-four  Anterolateral decompression/fusion/percutaneous pedicle screws  . SPINE SURGERY  2008   lumbar fusion    No prescriptions prior to admission.   Allergies  Allergen Reactions  . Bee Venom Anaphylaxis  . Penicillins Anaphylaxis    Has patient had a PCN reaction causing immediate rash, facial/tongue/throat swelling, SOB or lightheadedness with hypotension:Yes Has patient had a PCN reaction causing severe rash involving mucus membranes or skin necrosis:No Has patient had a PCN reaction that required hospitalization:Treated in ER for reaction Has patient had a PCN reaction occurring within the last 10 years:Yes If all of the above answers are "NO", then may proceed with Cephalosporin use.   . Codeine Other (See Comments)    Made pt feel like skin was crawling  . Xanax [Alprazolam]     Made him feel angry  Social History  Substance Use Topics  . Smoking status: Former Smoker    Packs/day: 2.00    Years: 40.00    Quit date: 11/18/2013  . Smokeless tobacco: Never Used  . Alcohol use No    Family History  Problem Relation Age of Onset  . Cancer Mother   . Heart disease Father   . Breast cancer Mother   . Osteoporosis Mother       Review of Systems  Constitutional: Negative.   HENT: Negative.   Eyes: Negative.   Respiratory: Negative.   Cardiovascular: Negative.   Gastrointestinal: Negative.   Genitourinary: Negative.   Musculoskeletal: Positive for joint pain.  Skin: Negative.   Neurological: Negative.   Endo/Heme/Allergies: Negative.   Psychiatric/Behavioral: Positive for depression.    Objective:  Physical Exam  Constitutional: He is oriented to person, place, and time. He appears well-developed.  HENT:  Head: Normocephalic.  Eyes: EOM are normal.  Neck: Normal range of motion.  Cardiovascular: Normal rate and intact distal pulses.   Respiratory: Effort normal.  GI: Soft.  Genitourinary:  Genitourinary Comments: Deferred  Musculoskeletal:  Right knee pain with rom.   Neurological: He is alert and oriented to person, place, and time. He has normal reflexes.  Skin: Skin is warm and dry.  Psychiatric: His behavior is normal.    Vital signs in last 24 hours: BP: ()/()  Arterial Line BP: ()/()   Labs:   Estimated body mass index is 35.27 kg/m as calculated from the following:   Height as of 04/28/15: 5\' 10"  (1.778 m).   Weight as of 04/28/15: 111.5 kg (245 lb 12.8 oz).   Imaging Review Plain radiographs demonstrate moderate degenerative joint disease of the right knee(s). The overall alignment ismild varus. The bone quality appears to be good for age and reported activity level.  Assessment/Plan:  End stage arthritis, right knee   The patient history, physical examination, clinical judgment of the provider and imaging studies are consistent with end stage degenerative joint disease of the right knee(s) and total knee arthroplasty is deemed medically necessary. The treatment options including medical management, injection therapy arthroscopy and arthroplasty were discussed at length. The risks and benefits of total knee arthroplasty were presented and reviewed. The risks due to aseptic  loosening, infection, stiffness, patella tracking problems, thromboembolic complications and other imponderables were discussed. The patient acknowledged the explanation, agreed to proceed with the plan and consent was signed. Patient is being admitted for inpatient treatment for surgery, pain control, PT, OT, prophylactic antibiotics, VTE prophylaxis, progressive ambulation and ADL's and discharge planning. The patient is planning to be discharged home with home health services.  Will use IV tranexamic acid. Contraindications and adverse affects of Tranexamic acid discussed in detail. Patient denies any of these at this time and understands the risks and benefits.

## 2017-01-26 ENCOUNTER — Encounter (HOSPITAL_COMMUNITY): Payer: Self-pay

## 2017-01-27 ENCOUNTER — Encounter (HOSPITAL_COMMUNITY)
Admission: RE | Admit: 2017-01-27 | Discharge: 2017-01-27 | Disposition: A | Discharge status: Home / Self Care | Payer: Self-pay | Source: Ambulatory Visit | Attending: Specialist | Admitting: Specialist

## 2017-01-30 NOTE — Patient Instructions (Addendum)
Erik Holmes.  01/30/2017   Your procedure is scheduled on: 02/03/2017    Report to [facility] Main  Entrance take [facility]  elevators to 3rd floor to  [place] at     [place] AM.  Call this number if you have problems the morning of surgery [phone]   Remember: ONLY 1 PERSON MAY GO WITH YOU TO SHORT STAY TO GET  READY MORNING OF [place].  Do not eat food or drink liquids :After Midnight.     Take these medicines the morning of surgery with A SIP OF WATER: Effexor( venlafaxine ), Abilify                                 You may not have any metal on your body including hair pins and              piercings  Do not wear jewelry, , lotions, powders or perfumes, deodorant                          Men may shave face and neck.   Do not bring valuables to the hospital. [place].  Contacts, dentures or bridgework may not be worn into surgery.  Leave suitcase in the car. After surgery it may be brought to your room.      Special Instructions: Bring CPAP mask and tubing               Please read over the following fact sheets you were given: _____________________________________________________________________             [facility] - Preparing for Surgery Before surgery, you can play an important role.  Because skin is not sterile, your skin needs to be as free of germs as possible.  You can reduce the number of germs on your skin by washing with CHG (chlorahexidine gluconate) soap before surgery.  CHG is an antiseptic cleaner which kills germs and bonds with the skin to continue killing germs even after washing. Please DO NOT use if you have an allergy to CHG or antibacterial soaps.  If your skin becomes reddened/irritated stop using the CHG and inform your nurse when you arrive at Short Stay. Do not shave (including legs and underarms) for at least 48 hours prior to the first CHG shower.  You  may shave your face/neck. Please follow these instructions carefully:  1.  Shower with CHG Soap the night before surgery and the  morning of Surgery.  2.  If you choose to wash your hair, wash your hair first as usual with your  normal  shampoo.  3.  After you shampoo, rinse your hair and body thoroughly to remove the  shampoo.                           4.  Use CHG as you would any other liquid soap.  You can apply chg directly  to the skin and wash                       Gently with a scrungie or clean washcloth.  5.  Apply the CHG Soap to your body ONLY FROM THE NECK DOWN.   Do not use on face/ open                           Wound or open sores. Avoid contact with eyes, ears mouth and genitals (private parts).                       Wash face,  Genitals (private parts) with your normal soap.             6.  Wash thoroughly, paying special attention to the area where your surgery  will be performed.  7.  Thoroughly rinse your body with warm water from the neck down.  8.  DO NOT shower/wash with your normal soap after using and rinsing off  the CHG Soap.                9.  Pat yourself dry with a clean towel.            10.  Wear clean pajamas.            11.  Place clean sheets on your bed the night of your first shower and do not  sleep with pets. Day of Surgery : Do not apply any lotions/deodorants the morning of surgery.  Please wear clean clothes to the hospital/surgery center.  FAILURE TO FOLLOW THESE INSTRUCTIONS MAY RESULT IN THE CANCELLATION OF YOUR SURGERY PATIENT SIGNATURE_________________________________  NURSE SIGNATURE__________________________________  ________________________________________________________________________  WHAT IS A BLOOD TRANSFUSION? Blood Transfusion Information  A transfusion is the replacement of blood or some of its parts. Blood is made up of multiple cells which provide different functions.  Red blood cells carry oxygen and are used for blood loss  replacement.  White blood cells fight against infection.  Platelets control bleeding.  Plasma helps clot blood.  Other blood products are available for specialized needs, such as hemophilia or other clotting disorders. BEFORE THE TRANSFUSION  Who gives blood for transfusions?   Healthy volunteers who are fully evaluated to make sure their blood is safe. This is blood bank blood. Transfusion therapy is the safest it has ever been in the practice of medicine. Before blood is taken from a donor, a complete history is taken to make sure that person has no history of diseases nor engages in risky social behavior (examples are intravenous drug use or sexual activity with multiple partners). The donor's travel history is screened to minimize risk of transmitting infections, such as malaria. The donated blood is tested for signs of infectious diseases, such as HIV and hepatitis. The blood is then tested to be sure it is compatible with you in order to minimize the chance of a transfusion reaction. If you or a relative donates blood, this is often done in anticipation of surgery and is not appropriate for emergency situations. It takes many days to process the donated blood. RISKS AND COMPLICATIONS Although transfusion therapy is very safe and saves many lives, the main dangers of transfusion include:   Getting an infectious disease.  Developing a transfusion reaction. This is an allergic reaction to something in the blood you were given. Every precaution is taken to prevent this. The decision to have a blood transfusion has been considered carefully by your caregiver before blood is given. Blood is not given unless the benefits outweigh the risks. AFTER THE TRANSFUSION  Right after receiving a  blood transfusion, you will usually feel much better and more energetic. This is especially true if your red blood cells have gotten low (anemic). The transfusion raises the level of the red blood cells which  carry oxygen, and this usually causes an energy increase.  The nurse administering the transfusion will monitor you carefully for complications. HOME CARE INSTRUCTIONS  No special instructions are needed after a transfusion. You may find your energy is better. Speak with your caregiver about any limitations on activity for underlying diseases you may have. SEEK MEDICAL CARE IF:   Your condition is not improving after your transfusion.  You develop redness or irritation at the intravenous (IV) site. SEEK IMMEDIATE MEDICAL CARE IF:  Any of the following symptoms occur over the next 12 hours:  Shaking chills.  You have a temperature by mouth above 102 F (38.9 C), not controlled by medicine.  Chest, back, or muscle pain.  People around you feel you are not acting correctly or are confused.  Shortness of breath or difficulty breathing.  Dizziness and fainting.  You get a rash or develop hives.  You have a decrease in urine output.  Your urine turns a dark color or changes to pink, red, or brown. Any of the following symptoms occur over the next 10 days:  You have a temperature by mouth above 102 F (38.9 C), not controlled by medicine.  Shortness of breath.  Weakness after normal activity.  The white part of the eye turns yellow (jaundice).  You have a decrease in the amount of urine or are urinating less often.  Your urine turns a dark color or changes to pink, red, or brown. Document Released: 11/04/2000 Document Revised: 01/30/2012 Document Reviewed: 06/23/2008 ExitCare Patient Information 2014 [place].  _______________________________________________________________________  Incentive Spirometer  An incentive spirometer is a tool that can help keep your lungs clear and active. This tool measures how well you are filling your lungs with each breath. Taking long deep breaths may help reverse or decrease the chance of developing breathing (pulmonary) problems  (especially infection) following:  A long period of time when you are unable to move or be active. BEFORE THE PROCEDURE   If the spirometer includes an indicator to show your best effort, your nurse or respiratory therapist will set it to a desired goal.  If possible, sit up straight or lean slightly forward. Try not to slouch.  Hold the incentive spirometer in an upright position. INSTRUCTIONS FOR USE  1. Sit on the edge of your bed if possible, or sit up as far as you can in bed or on a chair. 2. Hold the incentive spirometer in an upright position. 3. Breathe out normally. 4. Place the mouthpiece in your mouth and seal your lips tightly around it. 5. Breathe in slowly and as deeply as possible, raising the piston or the ball toward the top of the column. 6. Hold your breath for 3-5 seconds or for as long as possible. Allow the piston or ball to fall to the bottom of the column. 7. Remove the mouthpiece from your mouth and breathe out normally. 8. Rest for a few seconds and repeat Steps 1 through 7 at least 10 times every 1-2 hours when you are awake. Take your time and take a few normal breaths between deep breaths. 9. The spirometer may include an indicator to show your best effort. Use the indicator as a goal to work toward during each repetition. 10. After each set of 10  deep breaths, practice coughing to be sure your lungs are clear. If you have an incision (the cut made at the time of surgery), support your incision when coughing by placing a pillow or rolled up towels firmly against it. Once you are able to get out of bed, walk around indoors and cough well. You may stop using the incentive spirometer when instructed by your caregiver.  RISKS AND COMPLICATIONS  Take your time so you do not get dizzy or light-headed.  If you are in pain, you may need to take or ask for pain medication before doing incentive spirometry. It is harder to take a deep breath if you are having  pain. AFTER USE  Rest and breathe slowly and easily.  It can be helpful to keep track of a log of your progress. Your caregiver can provide you with a simple table to help with this. If you are using the spirometer at home, follow these instructions: [place] IF:   You are having difficultly using the spirometer.  You have trouble using the spirometer as often as instructed.  Your pain medication is not giving enough relief while using the spirometer.  You develop fever of 100.5 F (38.1 C) or higher. SEEK IMMEDIATE MEDICAL CARE IF:   You cough up bloody sputum that had not been present before.  You develop fever of 102 F (38.9 C) or greater.  You develop worsening pain at or near the incision site. MAKE SURE YOU:   Understand these instructions.  Will watch your condition.  Will get help right away if you are not doing well or get worse. Document Released: 03/20/2007 Document Revised: 01/30/2012 Document Reviewed: 05/21/2007 [facility] Patient Information 2014 [place], [place].   ________________________________________________________________________

## 2017-01-31 ENCOUNTER — Encounter (HOSPITAL_COMMUNITY)
Admission: RE | Admit: 2017-01-31 | Discharge: 2017-01-31 | Disposition: A | Discharge status: Home / Self Care | Payer: Medicare Other | Source: Ambulatory Visit | Attending: Specialist | Admitting: Specialist

## 2017-01-31 ENCOUNTER — Encounter (HOSPITAL_COMMUNITY): Payer: Self-pay

## 2017-01-31 LAB — BASIC METABOLIC PANEL
Anion gap: 7 (ref 5–15)
BUN: 14 mg/dL (ref 6–20)
CHLORIDE: 103 mmol/L (ref 101–111)
CO2: 26 mmol/L (ref 22–32)
CREATININE: 0.96 mg/dL (ref 0.61–1.24)
Calcium: 9.3 mg/dL (ref 8.9–10.3)
GFR calc Af Amer: >60 mL/min (ref >60)
GFR calc non Af Amer: >60 mL/min (ref >60)
GLUCOSE: 137 mg/dL — AB (ref 65–99)
POTASSIUM: 4.5 mmol/L (ref 3.5–5.1)
SODIUM: 136 mmol/L (ref 135–145)

## 2017-01-31 LAB — URINALYSIS, ROUTINE W REFLEX MICROSCOPIC
Bilirubin Urine: NEGATIVE
Glucose, UA: NEGATIVE mg/dL
HGB URINE DIPSTICK: NEGATIVE
Ketones, ur: NEGATIVE mg/dL
LEUKOCYTES UA: NEGATIVE
NITRITE: NEGATIVE
PROTEIN: NEGATIVE mg/dL
SPECIFIC GRAVITY, URINE: 1.015 (ref 1.005–1.030)
pH: 7 (ref 5.0–8.0)

## 2017-01-31 LAB — CBC
HCT: 52.9 % — ABNORMAL HIGH (ref 39.0–52.0)
Hemoglobin: 17.9 g/dL — ABNORMAL HIGH (ref 13.0–17.0)
MCH: 29.6 pg (ref 26.0–34.0)
MCHC: 33.8 g/dL (ref 30.0–36.0)
MCV: 87.6 fL (ref 78.0–100.0)
PLATELETS: 313 10*3/uL (ref 150–400)
RBC: 6.04 MIL/uL — ABNORMAL HIGH (ref 4.22–5.81)
RDW: 13.8 % (ref 11.5–15.5)
WBC: 12.7 10*3/uL — ABNORMAL HIGH (ref 4.0–10.5)

## 2017-01-31 LAB — APTT: APTT: 32 s (ref 24–36)

## 2017-01-31 LAB — SURGICAL PCR SCREEN
MRSA, PCR: NEGATIVE
STAPHYLOCOCCUS AUREUS: NEGATIVE

## 2017-01-31 LAB — PROTIME-INR
INR: 1.04
Prothrombin Time: 13.6 seconds (ref 11.4–15.2)

## 2017-01-31 LAB — ABO/RH: ABO/RH(D): A POS

## 2017-01-31 NOTE — Progress Notes (Signed)
LOV Dr Marisue Humble 03-08-16  Preoperative clearance / EKG 01-17-17 on chart

## 2017-02-02 MED ORDER — VANCOMYCIN HCL 10 G IV SOLR
1500.0000 mg | INTRAVENOUS | Status: AC
  Administered 2017-02-03: 1500 mg via INTRAVENOUS
  Filled 2017-02-02 (×2): qty 1500

## 2017-02-03 ENCOUNTER — Inpatient Hospital Stay (HOSPITAL_COMMUNITY): Payer: Medicare Other | Admitting: Registered Nurse

## 2017-02-03 ENCOUNTER — Encounter (HOSPITAL_COMMUNITY)
Admission: RE | Disposition: A | Discharge status: Home Health | Payer: Self-pay | Source: Ambulatory Visit | Attending: Specialist

## 2017-02-03 ENCOUNTER — Inpatient Hospital Stay (HOSPITAL_COMMUNITY)
Admission: RE | Admit: 2017-02-03 | Discharge: 2017-02-05 | DRG: 470 | Disposition: A | Discharge status: Home Health | Payer: Medicare Other | Source: Ambulatory Visit | Attending: Specialist | Admitting: Specialist

## 2017-02-03 ENCOUNTER — Encounter (HOSPITAL_COMMUNITY): Payer: Self-pay | Admitting: *Deleted

## 2017-02-03 DIAGNOSIS — Z885 Allergy status to narcotic agent status: Secondary | ICD-10-CM

## 2017-02-03 DIAGNOSIS — Z8249 Family history of ischemic heart disease and other diseases of the circulatory system: Secondary | ICD-10-CM

## 2017-02-03 DIAGNOSIS — Z880 Allergy status to penicillin: Secondary | ICD-10-CM | POA: Diagnosis not present

## 2017-02-03 DIAGNOSIS — K219 Gastro-esophageal reflux disease without esophagitis: Secondary | ICD-10-CM | POA: Diagnosis present

## 2017-02-03 DIAGNOSIS — Z6839 Body mass index (BMI) 39.0-39.9, adult: Secondary | ICD-10-CM | POA: Diagnosis not present

## 2017-02-03 DIAGNOSIS — M25561 Pain in right knee: Secondary | ICD-10-CM | POA: Diagnosis not present

## 2017-02-03 DIAGNOSIS — F329 Major depressive disorder, single episode, unspecified: Secondary | ICD-10-CM | POA: Diagnosis present

## 2017-02-03 DIAGNOSIS — Z803 Family history of malignant neoplasm of breast: Secondary | ICD-10-CM | POA: Diagnosis not present

## 2017-02-03 DIAGNOSIS — M1711 Unilateral primary osteoarthritis, right knee: Secondary | ICD-10-CM | POA: Diagnosis not present

## 2017-02-03 DIAGNOSIS — Z87820 Personal history of traumatic brain injury: Secondary | ICD-10-CM

## 2017-02-03 DIAGNOSIS — Z981 Arthrodesis status: Secondary | ICD-10-CM

## 2017-02-03 DIAGNOSIS — Z96659 Presence of unspecified artificial knee joint: Secondary | ICD-10-CM

## 2017-02-03 DIAGNOSIS — E669 Obesity, unspecified: Secondary | ICD-10-CM | POA: Diagnosis present

## 2017-02-03 DIAGNOSIS — G4730 Sleep apnea, unspecified: Secondary | ICD-10-CM | POA: Diagnosis present

## 2017-02-03 DIAGNOSIS — G2581 Restless legs syndrome: Secondary | ICD-10-CM | POA: Diagnosis present

## 2017-02-03 DIAGNOSIS — Z87891 Personal history of nicotine dependence: Secondary | ICD-10-CM | POA: Diagnosis not present

## 2017-02-03 DIAGNOSIS — Z8262 Family history of osteoporosis: Secondary | ICD-10-CM | POA: Diagnosis not present

## 2017-02-03 DIAGNOSIS — F419 Anxiety disorder, unspecified: Secondary | ICD-10-CM | POA: Diagnosis present

## 2017-02-03 DIAGNOSIS — Z91030 Bee allergy status: Secondary | ICD-10-CM | POA: Diagnosis not present

## 2017-02-03 DIAGNOSIS — Z888 Allergy status to other drugs, medicaments and biological substances status: Secondary | ICD-10-CM

## 2017-02-03 DIAGNOSIS — I10 Essential (primary) hypertension: Secondary | ICD-10-CM | POA: Diagnosis present

## 2017-02-03 DIAGNOSIS — N529 Male erectile dysfunction, unspecified: Secondary | ICD-10-CM | POA: Diagnosis not present

## 2017-02-03 DIAGNOSIS — G8918 Other acute postprocedural pain: Secondary | ICD-10-CM | POA: Diagnosis not present

## 2017-02-03 HISTORY — PX: TOTAL KNEE ARTHROPLASTY: SHX125

## 2017-02-03 LAB — TYPE AND SCREEN
ABO/RH(D): A POS
Antibody Screen: NEGATIVE

## 2017-02-03 SURGERY — ARTHROPLASTY, KNEE, TOTAL
Anesthesia: General | Site: Knee | Laterality: Right

## 2017-02-03 MED ORDER — ALUM & MAG HYDROXIDE-SIMETH 200-200-20 MG/5ML PO SUSP: 30.0000 mL | ORAL | Status: DC | PRN

## 2017-02-03 MED ORDER — METHOCARBAMOL 1000 MG/10ML IJ SOLN
500.0000 mg | Freq: Four times a day (QID) | INTRAVENOUS | Status: DC | PRN
  Administered 2017-02-03: 500 mg via INTRAVENOUS
  Filled 2017-02-03: qty 550
  Filled 2017-02-03: qty 5

## 2017-02-03 MED ORDER — ACETAMINOPHEN 325 MG PO TABS
650.0000 mg | ORAL_TABLET | Freq: Four times a day (QID) | ORAL | Status: DC | PRN
Start: 2017-02-03 — End: 2017-02-05
  Administered 2017-02-05: 10:00:00 650 mg via ORAL
  Filled 2017-02-03: qty 2

## 2017-02-03 MED ORDER — POVIDONE-IODINE 7.5 % EX SOLN: Freq: Once | CUTANEOUS | Status: DC

## 2017-02-03 MED ORDER — DIPHENHYDRAMINE HCL 12.5 MG/5ML PO ELIX
12.5000 mg | ORAL_SOLUTION | ORAL | Status: DC | PRN
Start: 2017-02-03 — End: 2017-02-05

## 2017-02-03 MED ORDER — LITHIUM CARBONATE 300 MG PO CAPS
300.0000 mg | ORAL_CAPSULE | Freq: Every day | ORAL | Status: DC
  Administered 2017-02-03 – 2017-02-04 (×2): 300 mg via ORAL
  Filled 2017-02-03 (×2): qty 1

## 2017-02-03 MED ORDER — POLYETHYLENE GLYCOL 3350 17 G PO PACK: 17.0000 g | PACK | Freq: Every day | ORAL | Status: DC | PRN

## 2017-02-03 MED ORDER — FENTANYL CITRATE (PF) 100 MCG/2ML IJ SOLN
INTRAMUSCULAR | Status: DC | PRN
  Administered 2017-02-03 (×4): 50 ug via INTRAVENOUS

## 2017-02-03 MED ORDER — SODIUM CHLORIDE 0.9 % IR SOLN
Status: DC | PRN
  Administered 2017-02-03: 1000 mL

## 2017-02-03 MED ORDER — KETOROLAC TROMETHAMINE 30 MG/ML IJ SOLN
INTRAMUSCULAR | Status: DC | PRN
  Administered 2017-02-03: 30 mg

## 2017-02-03 MED ORDER — HYDROMORPHONE HCL 1 MG/ML IJ SOLN: 1.0000 mg | INTRAMUSCULAR | Status: DC | PRN

## 2017-02-03 MED ORDER — SUCCINYLCHOLINE CHLORIDE 20 MG/ML IJ SOLN
INTRAMUSCULAR | Status: DC | PRN
  Administered 2017-02-03: 160 mg via INTRAVENOUS

## 2017-02-03 MED ORDER — ONDANSETRON HCL 4 MG/2ML IJ SOLN: 4.0000 mg | Freq: Four times a day (QID) | INTRAMUSCULAR | Status: DC | PRN

## 2017-02-03 MED ORDER — METHOCARBAMOL 500 MG PO TABS: 500.0000 mg | ORAL_TABLET | Freq: Four times a day (QID) | ORAL | 2 refills | Status: DC | PRN

## 2017-02-03 MED ORDER — LACTATED RINGERS IV SOLN
INTRAVENOUS | Status: DC
  Administered 2017-02-03: 06:00:00 via INTRAVENOUS

## 2017-02-03 MED ORDER — ASPIRIN EC 325 MG PO TBEC: 325.0000 mg | DELAYED_RELEASE_TABLET | Freq: Two times a day (BID) | ORAL | 0 refills | Status: DC

## 2017-02-03 MED ORDER — LIDOCAINE 2% (20 MG/ML) 5 ML SYRINGE
INTRAMUSCULAR | Status: DC | PRN
  Administered 2017-02-03: 100 mg via INTRAVENOUS

## 2017-02-03 MED ORDER — VANCOMYCIN HCL IN DEXTROSE 1-5 GM/200ML-% IV SOLN
1000.0000 mg | Freq: Two times a day (BID) | INTRAVENOUS | Status: AC
  Administered 2017-02-03: 1000 mg via INTRAVENOUS
  Filled 2017-02-03: qty 200

## 2017-02-03 MED ORDER — SUGAMMADEX SODIUM 200 MG/2ML IV SOLN
INTRAVENOUS | Status: AC
  Filled 2017-02-03: qty 2

## 2017-02-03 MED ORDER — ONDANSETRON HCL 4 MG PO TABS: 4.0000 mg | ORAL_TABLET | Freq: Four times a day (QID) | ORAL | Status: DC | PRN

## 2017-02-03 MED ORDER — DOCUSATE SODIUM 100 MG PO CAPS
100.0000 mg | ORAL_CAPSULE | Freq: Two times a day (BID) | ORAL | Status: DC
  Administered 2017-02-03 – 2017-02-05 (×4): 100 mg via ORAL
  Filled 2017-02-03 (×4): qty 1

## 2017-02-03 MED ORDER — ENOXAPARIN SODIUM 30 MG/0.3ML ~~LOC~~ SOLN
30.0000 mg | Freq: Two times a day (BID) | SUBCUTANEOUS | Status: DC
  Administered 2017-02-04 – 2017-02-05 (×3): 30 mg via SUBCUTANEOUS
  Filled 2017-02-03 (×3): qty 0.3

## 2017-02-03 MED ORDER — HYDROMORPHONE HCL 2 MG/ML IJ SOLN
INTRAMUSCULAR | Status: AC
  Filled 2017-02-03: qty 1

## 2017-02-03 MED ORDER — SODIUM CHLORIDE 0.9 % IJ SOLN
INTRAMUSCULAR | Status: AC
  Filled 2017-02-03: qty 50

## 2017-02-03 MED ORDER — METOCLOPRAMIDE HCL 5 MG/ML IJ SOLN: 5.0000 mg | Freq: Three times a day (TID) | INTRAMUSCULAR | Status: DC | PRN

## 2017-02-03 MED ORDER — ROPIVACAINE HCL 5 MG/ML IJ SOLN
INTRAMUSCULAR | Status: DC | PRN
  Administered 2017-02-03: 25 mL via PERINEURAL

## 2017-02-03 MED ORDER — LIDOCAINE 2% (20 MG/ML) 5 ML SYRINGE
INTRAMUSCULAR | Status: AC
  Filled 2017-02-03: qty 5

## 2017-02-03 MED ORDER — LISINOPRIL 10 MG PO TABS
10.0000 mg | ORAL_TABLET | Freq: Every day | ORAL | Status: DC
Start: 2017-02-04 — End: 2017-02-05
  Administered 2017-02-04 – 2017-02-05 (×2): 10 mg via ORAL
  Filled 2017-02-03 (×2): qty 1

## 2017-02-03 MED ORDER — HYDROMORPHONE HCL 1 MG/ML IJ SOLN
INTRAMUSCULAR | Status: AC
  Filled 2017-02-03: qty 1

## 2017-02-03 MED ORDER — ARIPIPRAZOLE 10 MG PO TABS
10.0000 mg | ORAL_TABLET | Freq: Every day | ORAL | Status: DC
  Administered 2017-02-04 – 2017-02-05 (×2): 10 mg via ORAL
  Filled 2017-02-03 (×2): qty 1

## 2017-02-03 MED ORDER — BUPIVACAINE HCL (PF) 0.25 % IJ SOLN
INTRAMUSCULAR | Status: DC | PRN
  Administered 2017-02-03: 20 mL

## 2017-02-03 MED ORDER — PROMETHAZINE HCL 25 MG/ML IJ SOLN: 6.2500 mg | INTRAMUSCULAR | Status: DC | PRN

## 2017-02-03 MED ORDER — ACETAMINOPHEN 650 MG RE SUPP
650.0000 mg | Freq: Four times a day (QID) | RECTAL | Status: DC | PRN
Start: 2017-02-03 — End: 2017-02-05

## 2017-02-03 MED ORDER — PROPOFOL 10 MG/ML IV BOLUS
INTRAVENOUS | Status: DC | PRN
  Administered 2017-02-03: 250 mg via INTRAVENOUS

## 2017-02-03 MED ORDER — METOCLOPRAMIDE HCL 5 MG PO TABS: 5.0000 mg | ORAL_TABLET | Freq: Three times a day (TID) | ORAL | Status: DC | PRN

## 2017-02-03 MED ORDER — HYDROMORPHONE HCL 1 MG/ML IJ SOLN
0.2500 mg | INTRAMUSCULAR | Status: DC | PRN
  Administered 2017-02-03 (×3): 0.5 mg via INTRAVENOUS

## 2017-02-03 MED ORDER — ONDANSETRON HCL 4 MG/2ML IJ SOLN
INTRAMUSCULAR | Status: DC | PRN
  Administered 2017-02-03: 4 mg via INTRAVENOUS

## 2017-02-03 MED ORDER — PHENOL 1.4 % MT LIQD: 1.0000 | OROMUCOSAL | Status: DC | PRN

## 2017-02-03 MED ORDER — TRANEXAMIC ACID 1000 MG/10ML IV SOLN
1000.0000 mg | INTRAVENOUS | Status: AC
  Administered 2017-02-03: 1000 mg via INTRAVENOUS
  Filled 2017-02-03: qty 1100

## 2017-02-03 MED ORDER — DEXAMETHASONE SODIUM PHOSPHATE 10 MG/ML IJ SOLN
10.0000 mg | Freq: Once | INTRAMUSCULAR | Status: AC
  Administered 2017-02-03: 10 mg via INTRAVENOUS

## 2017-02-03 MED ORDER — KETOROLAC TROMETHAMINE 30 MG/ML IJ SOLN
INTRAMUSCULAR | Status: AC
  Filled 2017-02-03: qty 1

## 2017-02-03 MED ORDER — VENLAFAXINE HCL ER 150 MG PO CP24
300.0000 mg | ORAL_CAPSULE | Freq: Every day | ORAL | Status: DC
  Administered 2017-02-04 – 2017-02-05 (×2): 300 mg via ORAL
  Filled 2017-02-03 (×2): qty 2

## 2017-02-03 MED ORDER — METHOCARBAMOL 500 MG PO TABS
500.0000 mg | ORAL_TABLET | Freq: Four times a day (QID) | ORAL | Status: DC | PRN
  Administered 2017-02-03 – 2017-02-05 (×5): 500 mg via ORAL
  Filled 2017-02-03 (×5): qty 1

## 2017-02-03 MED ORDER — ROCURONIUM BROMIDE 100 MG/10ML IV SOLN
INTRAVENOUS | Status: DC | PRN
  Administered 2017-02-03: 10 mg via INTRAVENOUS
  Administered 2017-02-03: 30 mg via INTRAVENOUS

## 2017-02-03 MED ORDER — POTASSIUM CHLORIDE IN NACL 20-0.9 MEQ/L-% IV SOLN
INTRAVENOUS | Status: DC
  Administered 2017-02-03 – 2017-02-04 (×2): via INTRAVENOUS
  Filled 2017-02-03 (×3): qty 1000

## 2017-02-03 MED ORDER — DEXAMETHASONE SODIUM PHOSPHATE 10 MG/ML IJ SOLN
INTRAMUSCULAR | Status: AC
  Filled 2017-02-03: qty 1

## 2017-02-03 MED ORDER — MIDAZOLAM HCL 2 MG/2ML IJ SOLN
INTRAMUSCULAR | Status: AC
  Filled 2017-02-03: qty 2

## 2017-02-03 MED ORDER — SUGAMMADEX SODIUM 200 MG/2ML IV SOLN
INTRAVENOUS | Status: DC | PRN
  Administered 2017-02-03: 200 mg via INTRAVENOUS

## 2017-02-03 MED ORDER — MAGNESIUM CITRATE PO SOLN: 1.0000 | Freq: Once | ORAL | Status: DC | PRN

## 2017-02-03 MED ORDER — DEXAMETHASONE SODIUM PHOSPHATE 10 MG/ML IJ SOLN
10.0000 mg | Freq: Once | INTRAMUSCULAR | Status: AC
  Administered 2017-02-04: 10 mg via INTRAVENOUS
  Filled 2017-02-03: qty 1

## 2017-02-03 MED ORDER — MENTHOL 3 MG MT LOZG: 1.0000 | LOZENGE | OROMUCOSAL | Status: DC | PRN

## 2017-02-03 MED ORDER — PROPOFOL 500 MG/50ML IV EMUL
INTRAVENOUS | Status: DC | PRN
  Administered 2017-02-03: 40 ug/kg/min via INTRAVENOUS

## 2017-02-03 MED ORDER — KETOROLAC TROMETHAMINE 30 MG/ML IJ SOLN
INTRAMUSCULAR | Status: AC
Start: 2017-02-03 — End: 2017-02-03
  Filled 2017-02-03: qty 1

## 2017-02-03 MED ORDER — FERROUS SULFATE 325 (65 FE) MG PO TABS
325.0000 mg | ORAL_TABLET | Freq: Three times a day (TID) | ORAL | Status: DC
  Administered 2017-02-03 – 2017-02-05 (×4): 325 mg via ORAL
  Filled 2017-02-03 (×4): qty 1

## 2017-02-03 MED ORDER — TADALAFIL 20 MG PO TABS: 10.0000 mg | ORAL_TABLET | Freq: Every day | ORAL | Status: DC | PRN

## 2017-02-03 MED ORDER — TEMAZEPAM 15 MG PO CAPS
30.0000 mg | ORAL_CAPSULE | Freq: Every day | ORAL | Status: DC
  Administered 2017-02-03 – 2017-02-04 (×2): 30 mg via ORAL
  Filled 2017-02-03 (×2): qty 2

## 2017-02-03 MED ORDER — OXYCODONE HCL 5 MG PO TABS: 10.0000 mg | ORAL_TABLET | ORAL | 0 refills | Status: DC | PRN

## 2017-02-03 MED ORDER — MIDAZOLAM HCL 5 MG/5ML IJ SOLN
INTRAMUSCULAR | Status: DC | PRN
  Administered 2017-02-03: 2 mg via INTRAVENOUS

## 2017-02-03 MED ORDER — HYDROMORPHONE HCL 1 MG/ML IJ SOLN
INTRAMUSCULAR | Status: DC | PRN
  Administered 2017-02-03: .2 mg via INTRAVENOUS
  Administered 2017-02-03 (×2): .4 mg via INTRAVENOUS

## 2017-02-03 MED ORDER — BUPIVACAINE HCL (PF) 0.25 % IJ SOLN
INTRAMUSCULAR | Status: AC
  Filled 2017-02-03: qty 30

## 2017-02-03 MED ORDER — OXYCODONE HCL 5 MG PO TABS
10.0000 mg | ORAL_TABLET | ORAL | Status: DC | PRN
  Administered 2017-02-03 (×3): 15 mg via ORAL
  Administered 2017-02-03: 10 mg via ORAL
  Administered 2017-02-04 – 2017-02-05 (×10): 15 mg via ORAL
  Filled 2017-02-03 (×7): qty 3
  Filled 2017-02-03: qty 2
  Filled 2017-02-03 (×6): qty 3

## 2017-02-03 MED ORDER — PROPOFOL 10 MG/ML IV BOLUS
INTRAVENOUS | Status: AC
  Filled 2017-02-03: qty 40

## 2017-02-03 MED ORDER — SODIUM CHLORIDE 0.9 % IJ SOLN
INTRAMUSCULAR | Status: DC | PRN
Start: 2017-02-03 — End: 2017-02-03
  Administered 2017-02-03: 30 mL

## 2017-02-03 MED ORDER — BISACODYL 5 MG PO TBEC: 5.0000 mg | DELAYED_RELEASE_TABLET | Freq: Every day | ORAL | Status: DC | PRN

## 2017-02-03 MED ORDER — FENTANYL CITRATE (PF) 100 MCG/2ML IJ SOLN
INTRAMUSCULAR | Status: AC
  Filled 2017-02-03: qty 2

## 2017-02-03 SURGICAL SUPPLY — 59 items
ADH SKN CLS APL DERMABOND .7 (GAUZE/BANDAGES/DRESSINGS) ×1
BAG DECANTER FOR FLEXI CONT (MISCELLANEOUS) IMPLANT
BAG SPEC THK2 15X12 ZIP CLS (MISCELLANEOUS) ×2
BAG ZIPLOCK 12X15 (MISCELLANEOUS) ×6 IMPLANT
BANDAGE ACE 4X5 VEL STRL LF (GAUZE/BANDAGES/DRESSINGS) ×3 IMPLANT
BANDAGE ACE 6X5 VEL STRL LF (GAUZE/BANDAGES/DRESSINGS) ×3 IMPLANT
BLADE SAG 18X100X1.27 (BLADE) ×3 IMPLANT
BLADE SAW SGTL 13.0X1.19X90.0M (BLADE) ×3 IMPLANT
BOWL SMART MIX CTS (DISPOSABLE) ×3 IMPLANT
CAP KNEE TOTAL 3 SIGMA ×2 IMPLANT
CEMENT HV SMART SET (Cement) ×4 IMPLANT
CUFF TOURN SGL QUICK 34 (TOURNIQUET CUFF) ×3
CUFF TRNQT CYL 34X4X40X1 (TOURNIQUET CUFF) ×1 IMPLANT
DECANTER SPIKE VIAL GLASS SM (MISCELLANEOUS) ×3 IMPLANT
DERMABOND ADVANCED (GAUZE/BANDAGES/DRESSINGS) ×2
DERMABOND ADVANCED .7 DNX12 (GAUZE/BANDAGES/DRESSINGS) ×1 IMPLANT
DRAPE U-SHAPE 47X51 STRL (DRAPES) ×3 IMPLANT
DRSG AQUACEL AG ADV 3.5X10 (GAUZE/BANDAGES/DRESSINGS) ×3 IMPLANT
DRSG TEGADERM 4X4.75 (GAUZE/BANDAGES/DRESSINGS) ×3 IMPLANT
DURAPREP 26ML APPLICATOR (WOUND CARE) ×6 IMPLANT
ELECT REM PT RETURN 9FT ADLT (ELECTROSURGICAL) ×3
ELECTRODE REM PT RTRN 9FT ADLT (ELECTROSURGICAL) ×1 IMPLANT
EVACUATOR 1/8 PVC DRAIN (DRAIN) ×3 IMPLANT
GAUZE SPONGE 2X2 8PLY STRL LF (GAUZE/BANDAGES/DRESSINGS) ×1 IMPLANT
GLOVE BIOGEL PI IND STRL 8 (GLOVE) ×2 IMPLANT
GLOVE BIOGEL PI INDICATOR 8 (GLOVE) ×4
GLOVE ECLIPSE 8.0 STRL XLNG CF (GLOVE) ×6 IMPLANT
GLOVE SURG ORTHO 9.0 STRL STRW (GLOVE) ×3 IMPLANT
GLOVE SURG SS PI 7.5 STRL IVOR (GLOVE) ×3 IMPLANT
GOWN STRL REUS W/TWL XL LVL3 (GOWN DISPOSABLE) ×6 IMPLANT
HANDPIECE INTERPULSE COAX TIP (DISPOSABLE) ×3
IMMOBILIZER KNEE 20 (SOFTGOODS) ×3
IMMOBILIZER KNEE 20 THIGH 36 (SOFTGOODS) ×1 IMPLANT
LIQUID BAND (GAUZE/BANDAGES/DRESSINGS) ×2 IMPLANT
NS IRRIG 1000ML POUR BTL (IV SOLUTION) ×3 IMPLANT
PACK TOTAL KNEE CUSTOM (KITS) ×3 IMPLANT
POSITIONER SURGICAL ARM (MISCELLANEOUS) ×3 IMPLANT
SET HNDPC FAN SPRY TIP SCT (DISPOSABLE) ×1 IMPLANT
SET PAD KNEE POSITIONER (MISCELLANEOUS) ×3 IMPLANT
SPONGE GAUZE 2X2 STER 10/PKG (GAUZE/BANDAGES/DRESSINGS) ×2
SPONGE LAP 18X18 X RAY DECT (DISPOSABLE) IMPLANT
SPONGE SURGIFOAM ABS GEL 100 (HEMOSTASIS) ×3 IMPLANT
STOCKINETTE 6  STRL (DRAPES) ×2
STOCKINETTE 6 STRL (DRAPES) ×1 IMPLANT
SUCTION FRAZIER HANDLE 12FR (TUBING) ×2
SUCTION TUBE FRAZIER 12FR DISP (TUBING) ×1 IMPLANT
SUT BONE WAX W31G (SUTURE) IMPLANT
SUT MNCRL AB 3-0 PS2 18 (SUTURE) ×3 IMPLANT
SUT VIC AB 1 CT1 27 (SUTURE) ×12
SUT VIC AB 1 CT1 27XBRD ANTBC (SUTURE) ×4 IMPLANT
SUT VIC AB 2-0 CT1 27 (SUTURE) ×6
SUT VIC AB 2-0 CT1 TAPERPNT 27 (SUTURE) ×2 IMPLANT
SUT VLOC 180 0 24IN GS25 (SUTURE) ×3 IMPLANT
SYR 50ML LL SCALE MARK (SYRINGE) ×3 IMPLANT
TAPE STRIPS DRAPE STRL (GAUZE/BANDAGES/DRESSINGS) ×3 IMPLANT
TRAY FOLEY W/METER SILVER 16FR (SET/KITS/TRAYS/PACK) ×3 IMPLANT
WATER STERILE IRR 1500ML POUR (IV SOLUTION) ×6 IMPLANT
WRAP KNEE MAXI GEL POST OP (GAUZE/BANDAGES/DRESSINGS) ×3 IMPLANT
YANKAUER SUCT BULB TIP 10FT TU (MISCELLANEOUS) ×3 IMPLANT

## 2017-02-03 NOTE — Interval H&P Note (Signed)
History and Physical Interval Note:  02/03/2017 10:01 AM  Erik Holmes.  has presented today for surgery, with the diagnosis of Right knee osteoarthritis  The various methods of treatment have been discussed with the patient and family. After consideration of risks, benefits and other options for treatment, the patient has consented to  Procedure(s): RIGHT TOTAL KNEE ARTHROPLASTY (Right) as a surgical intervention .  The patient's history has been reviewed, patient examined, no change in status, stable for surgery.  I have reviewed the patient's chart and labs.  Questions were answered to the patient's satisfaction.     [name] ANDREW

## 2017-02-03 NOTE — Transfer of Care (Signed)
Immediate Anesthesia Transfer of Care Note  Patient: Erik Holmes.  Procedure(s) Performed: Procedure(s): RIGHT TOTAL KNEE ARTHROPLASTY (Right)  Patient Location: PACU  Anesthesia Type:General  Level of Consciousness: awake, alert , oriented and patient cooperative  Airway & Oxygen Therapy: Patient Spontanous Breathing and Patient connected to face mask oxygen  Post-op Assessment: Report given to RN, Post -op Vital signs reviewed and stable and Patient moving all extremities  Post vital signs: Reviewed and stable  Last Vitals:  Vitals:   02/03/17 0529  BP: 136/83  Pulse: 83  Resp: 18  Temp: 37 C    Last Pain:  Vitals:   02/03/17 0602  TempSrc:   PainSc: 3          Complications: No apparent anesthesia complications

## 2017-02-03 NOTE — Anesthesia Postprocedure Evaluation (Signed)
Anesthesia Post Note  Patient: Erik Holmes.  Procedure(s) Performed: Procedure(s) (LRB): RIGHT TOTAL KNEE ARTHROPLASTY (Right)  Patient location during evaluation: PACU Anesthesia Type: General Level of consciousness: awake and alert Pain management: pain level controlled Vital Signs Assessment: post-procedure vital signs reviewed and stable Respiratory status: spontaneous breathing, nonlabored ventilation, respiratory function stable and patient connected to nasal cannula oxygen Cardiovascular status: blood pressure returned to baseline and stable Postop Assessment: no signs of nausea or vomiting Anesthetic complications: no       Last Vitals:  Vitals:   02/03/17 1100 02/03/17 1127  BP: (!) 150/86 123/80  Pulse: 97 97  Resp: 18 16  Temp: 36.8 C 36.9 C    Last Pain:  Vitals:   02/03/17 1100  TempSrc:   PainSc: 5                  Tiajuana Amass

## 2017-02-03 NOTE — Progress Notes (Signed)
PT Cancellation Note  Patient Details Name: Erik Holmes. MRN: [id number] DOB: [birth year]   Cancelled Treatment:    Reason Eval/Treat Not Completed: Patient declined,  (does not want to attempt OOB at this time.)   Claretha Cooper 02/03/2017, 3:45 PM Tresa Endo PT [phone]

## 2017-02-03 NOTE — Anesthesia Procedure Notes (Signed)
Anesthesia Regional Block: Adductor canal block   Pre-Anesthetic Checklist: ,, timeout performed, Correct Patient, Correct Site, Correct Laterality, Correct Procedure, Correct Position, site marked, Risks and benefits discussed,  Surgical consent,  Pre-op evaluation,  At surgeon's request and post-op pain management  Laterality: Right  Prep: chloraprep       Needles:  Injection technique: Single-shot  Needle Type: Echogenic Needle     Needle Length: 9cm  Needle Gauge: 21     Additional Needles:   Procedures: ultrasound guided,,,,,,,,  Narrative:  Start time: 02/03/2017 7:10 AM End time: 02/03/2017 7:17 AM Injection made incrementally with aspirations every 5 mL.  Performed by: Personally  Anesthesiologist: Suzette Battiest

## 2017-02-03 NOTE — Progress Notes (Signed)
CSW consulted for SNF placement. Pt is a medicare bundle program. CSW spoke with [facility] for [employer]. Sharee Pimple has confirmed with Dr. Theda Sers that pt will d/c home when stable. No SNF for this pt.  CSW signing off.  Werner Lean LCSW [phone]

## 2017-02-03 NOTE — Anesthesia Preprocedure Evaluation (Addendum)
Anesthesia Evaluation  Patient identified by MRN, date of birth, ID band Patient awake    Reviewed: Allergy & Precautions, NPO status , Patient's Chart, lab work & pertinent test results  Airway Mallampati: II  TM Distance: >3 FB Neck ROM: Full    Dental  (+) Dental Advisory Given   Pulmonary sleep apnea and Continuous Positive Airway Pressure Ventilation , Current Smoker,    breath sounds clear to auscultation       Cardiovascular hypertension, Pt. on medications  Rhythm:Regular Rate:Normal     Neuro/Psych Anxiety Depression negative neurological ROS     GI/Hepatic Neg liver ROS, GERD  ,  Endo/Other  negative endocrine ROS  Renal/GU negative Renal ROS     Musculoskeletal  (+) Arthritis ,   Abdominal (+) + obese,   Peds  Hematology negative hematology ROS (+)   Anesthesia Other Findings   Reproductive/Obstetrics                            Lab Results  Component Value Date   WBC 12.7 (H) 01/31/2017   HGB 17.9 (H) 01/31/2017   HCT 52.9 (H) 01/31/2017   MCV 87.6 01/31/2017   PLT 313 01/31/2017   Lab Results  Component Value Date   CREATININE 0.96 01/31/2017   BUN 14 01/31/2017   NA 136 01/31/2017   K 4.5 01/31/2017   CL 103 01/31/2017   CO2 26 01/31/2017   Lab Results  Component Value Date   INR 1.04 01/31/2017    Anesthesia Physical Anesthesia Plan  ASA: III  Anesthesia Plan: Spinal and MAC   Post-op Pain Management:  Regional for Post-op pain   Induction: Intravenous  Airway Management Planned: Simple Face Mask and Natural Airway  Additional Equipment:   Intra-op Plan:   Post-operative Plan:   Informed Consent: I have reviewed the patients History and Physical, chart, labs and discussed the procedure including the risks, benefits and alternatives for the proposed anesthesia with the patient or authorized representative who has indicated his/her understanding and  acceptance.     Plan Discussed with: CRNA and Surgeon  Anesthesia Plan Comments:        Anesthesia Quick Evaluation

## 2017-02-03 NOTE — Interval H&P Note (Signed)
History and Physical Interval Note:  02/03/2017 7:47 AM  [name].  has presented today for surgery, with the diagnosis of Right knee osteoarthritis  The various methods of treatment have been discussed with the patient and family. After consideration of risks, benefits and other options for treatment, the patient has consented to  Procedure(s): RIGHT TOTAL KNEE ARTHROPLASTY (Right) as a surgical intervention .  The patient's history has been reviewed, patient examined, no change in status, stable for surgery.  I have reviewed the patient's chart and labs.  Questions were answered to the patient's satisfaction.     Erik Holmes ANDREW

## 2017-02-03 NOTE — Op Note (Signed)
DATE OF SURGERY:  02/03/2017  TIME: 10:02 AM  PATIENT NAME:  Erik Holmes.    AGE: 56 y.o.   PRE-OPERATIVE DIAGNOSIS:  Right knee osteoarthritis  POST-OPERATIVE DIAGNOSIS:  Right knee osteoarthritis  PROCEDURE:  Procedure(s): RIGHT TOTAL KNEE ARTHROPLASTY  SURGEON:  Izola Teague ANDREW  ASSISTANT:  Bryson Stilwell, PA-C, present and scrubbed throughout the case, critical for assistance with exposure, retraction, instrumentation, and closure.  OPERATIVE IMPLANTS: Depuy PFC Sigma Rotating Platform.  Femur size 5, Tibia size 5, Patella size 38 3-peg oval button, with a 12.5 mm polyethylene insert.   PREOPERATIVE INDICATIONS:   [name]. is a 56 y.o. year old male with end stage bone on bone arthritis of the knee who failed conservative treatment and elected for Total Knee Arthroplasty.   The risks, benefits, and alternatives were discussed at length including but not limited to the risks of infection, bleeding, nerve injury, stiffness, blood clots, the need for revision surgery, cardiopulmonary complications, among others, and they were willing to proceed.  OPERATIVE DESCRIPTION:  The patient was brought to the operative room and placed in a supine position.  Spinal anesthesia was administered.  IV antibiotics were given.  The lower extremity was prepped and draped in the usual sterile fashion.  Time out was performed.  The leg was elevated and exsanguinated and the tourniquet was inflated.  Anterior quadriceps tendon splitting approach was performed.  The patella was retracted and osteophytes were removed.  The anterior horn of the medial and lateral meniscus was removed and cruciate ligaments resected.   The distal femur was opened with the drill and the intramedullary distal femoral cutting jig was utilized, set at 5 degrees resecting 10 mm off the distal femur.  Care was taken to protect the collateral ligaments.  The distal femoral sizing jig was applied,  taking care to avoid notching.  Then the 4-in-1 cutting jig was applied and the anterior and posterior femur was cut, along with the chamfer cuts.    Then the extramedullary tibial cutting jig was utilized making the appropriate cut using the anterior tibial crest as a reference building in appropriate posterior slope.  Care was taken during the cut to protect the medial and collateral ligaments.  The proximal tibia was removed along with the posterior horns of the menisci.   The posterior medial femoral osteophytes and posterior lateral femoral osteophytes were removed.    The flexion gap was then measured and was symmetric with the extension gap, measured at 12.  I completed the distal femoral preparation using the appropriate jig to prepare the box.  The patella was then measured, and cut with the saw.    The proximal tibia sized and prepared accordingly with the reamer and the punch, and then all components were trialed with the trial insert.  The knee was found to have excellent balance and full motion.    The above named components were then cemented into place and all excess cement was removed.  The trial polyethylene component was in place during cementation, and then was exchanged for the real polyethylene component.    The knee was easily taken through a range of motion and the patella tracked well and the knee irrigated copiously and the parapatellar and subcutaneous tissue closed with vicryl, and monocryl with steri strips for the skin.  The arthrotomy was closed at 90 of flexion. The wounds were dressed with sterile gauze and the tourniquet released and the patient was awakened and returned to  the PACU in stable and satisfactory condition.  There were no complications.  Total tourniquet time was 85 minutes.

## 2017-02-03 NOTE — Anesthesia Procedure Notes (Signed)
Procedure Name: Intubation Date/Time: 02/03/2017 8:07 AM Performed by: Carleene Cooper A Pre-anesthesia Checklist: Patient identified, Timeout performed, Suction available, Patient being monitored and Emergency Drugs available Patient Re-evaluated:Patient Re-evaluated prior to inductionOxygen Delivery Method: Circle system utilized Preoxygenation: Pre-oxygenation with 100% oxygen Intubation Type: IV induction Ventilation: Mask ventilation without difficulty Laryngoscope Size: Mac and 4 Grade View: Grade II Tube type: Oral Tube size: 7.5 mm Number of attempts: 1 Airway Equipment and Method: Bougie stylet Placement Confirmation: ETT inserted through vocal cords under direct vision,  positive ETCO2 and breath sounds checked- equal and bilateral Secured at: 24 cm Tube secured with: Tape Dental Injury: Teeth and Oropharynx as per pre-operative assessment  Comments: Grade 2 view noted with MAC 4 blade. Bougie passed with ease. + ETCO2, BBS=. ATOI. Larynx noted to be deep.

## 2017-02-04 LAB — BASIC METABOLIC PANEL
ANION GAP: 6 (ref 5–15)
BUN: 14 mg/dL (ref 6–20)
CHLORIDE: 104 mmol/L (ref 101–111)
CO2: 24 mmol/L (ref 22–32)
Calcium: 8.4 mg/dL — ABNORMAL LOW (ref 8.9–10.3)
Creatinine, Ser: 1.12 mg/dL (ref 0.61–1.24)
GFR calc Af Amer: >60 mL/min (ref >60)
GLUCOSE: 261 mg/dL — AB (ref 65–99)
POTASSIUM: 5.2 mmol/L — AB (ref 3.5–5.1)
Sodium: 134 mmol/L — ABNORMAL LOW (ref 135–145)

## 2017-02-04 LAB — CBC
HEMATOCRIT: 43 % (ref 39.0–52.0)
Hemoglobin: 13.8 g/dL (ref 13.0–17.0)
MCH: 28.3 pg (ref 26.0–34.0)
MCHC: 32.1 g/dL (ref 30.0–36.0)
MCV: 88.3 fL (ref 78.0–100.0)
PLATELETS: 290 10*3/uL (ref 150–400)
RBC: 4.87 MIL/uL (ref 4.22–5.81)
RDW: 13.7 % (ref 11.5–15.5)
WBC: 19.9 10*3/uL — ABNORMAL HIGH (ref 4.0–10.5)

## 2017-02-04 NOTE — Progress Notes (Signed)
Pt has refused CPAP for the night.  RT to monitor and assess as needed.  

## 2017-02-04 NOTE — Progress Notes (Signed)
Subjective: 1 Day Post-Op Procedure(s) (LRB): RIGHT TOTAL KNEE ARTHROPLASTY (Right) Patient reports pain as 3 on 0-10 scale.    Objective: Vital signs in last 24 hours: Temp:  [97.5 F (36.4 C)-98.8 F (37.1 C)] 98.4 F (36.9 C) (03/17 0600) Pulse Rate:  [81-100] 81 (03/17 0600) Resp:  [16-29] 18 (03/17 0600) BP: (113-160)/(72-128) 136/92 (03/17 0600) SpO2:  [93 %-99 %] 97 % (03/17 0600)  Intake/Output from previous day: 03/16 0701 - 03/17 0700 In: 5158.8 [P.O.:1440; I.V.:2853.8; IV Piggyback:865] Out: 3370 [Urine:3345; Blood:25] Intake/Output this shift: No intake/output data recorded.   Recent Labs  02/04/17 0437  HGB 13.8    Recent Labs  02/04/17 0437  WBC 19.9*  RBC 4.87  HCT 43.0  PLT 290    Recent Labs  02/04/17 0437  NA 134*  K 5.2*  CL 104  CO2 24  BUN 14  CREATININE 1.12  GLUCOSE 261*  CALCIUM 8.4*   No results for input(s): LABPT, INR in the last 72 hours.  Neurologically intact Intact pulses distally Dorsiflexion/Plantar flexion intact Incision: scant drainage Compartment soft HV D/C'd dressing placed.  Assessment/Plan: 1 Day Post-Op Procedure(s) (LRB): RIGHT TOTAL KNEE ARTHROPLASTY (Right)  Elevated gluc fasting. No D5 IV. No hx diabetes but runs in family. No Sx. Check A1C and glucose tomorrow.  Advance diet Up with therapy Plan for discharge tomorrow  Jaycie Kregel C 02/04/2017, 9:10 AM

## 2017-02-04 NOTE — Evaluation (Signed)
Physical Therapy Evaluation Patient Details Name: Erik Holmes. MRN: [id number] DOB: [birth year] Today's Date: 02/04/2017   History of Present Illness  R TKA  Clinical Impression  Pt is s/p TKA resulting in the deficits listed below (see PT Problem List). Pt ambulated 14' with RW and performed TKA exercises with min A. Good progress expected.  Pt will benefit from skilled PT to increase their independence and safety with mobility to allow discharge to the venue listed below.      Follow Up Recommendations Home health PT    Equipment Recommendations  Rolling walker with 5" wheels    Recommendations for Other Services       Precautions / Restrictions Precautions Precautions: Fall;Knee Precaution Comments: denies falls in past 1 year; reviewed no pillow under knee Required Braces or Orthoses: Knee Immobilizer - Right Restrictions Weight Bearing Restrictions: No      Mobility  Bed Mobility Overal bed mobility: Modified Independent             General bed mobility comments: HOB up  Transfers Overall transfer level: Needs assistance Equipment used: Rolling walker (2 wheeled) Transfers: Sit to/from Stand Sit to Stand: Min assist         General transfer comment: VCs hand placement, min A to power up  Ambulation/Gait Ambulation/Gait assistance: Min guard Ambulation Distance (Feet): 80 Feet Assistive device: Rolling walker (2 wheeled) Gait Pattern/deviations: Step-to pattern;Decreased step length - right;Decreased step length - left   Gait velocity interpretation: Below normal speed for age/gender General Gait Details: steady, distance limited by pain  Stairs            Wheelchair Mobility    Modified Rankin (Stroke Patients Only)       Balance Overall balance assessment: Modified Independent                                           Pertinent Vitals/Pain Pain Assessment: 0-10 Pain Score: 5  Pain Location: R knee Pain  Descriptors / Indicators: Sore Pain Intervention(s): Limited activity within patient's tolerance;Monitored during session;Premedicated before session;Ice applied    Home Living Family/patient expects to be discharged to:: Private residence Living Arrangements: Spouse/significant other Available Help at Discharge: Family;Available 24 hours/day Type of Home: House Home Access: Stairs to enter Entrance Stairs-Rails: Right Entrance Stairs-Number of Steps: 10 Home Layout: Two level;1/2 bath on main level Home Equipment: Crutches      Prior Function Level of Independence: Independent               Hand Dominance   Dominant Hand: Right    Extremity/Trunk Assessment   Upper Extremity Assessment Upper Extremity Assessment: Overall WFL for tasks assessed    Lower Extremity Assessment Lower Extremity Assessment: RLE deficits/detail RLE Deficits / Details: 5-40* AAROM R knee , -3/5 SLR    Cervical / Trunk Assessment Cervical / Trunk Assessment: Normal  Communication   Communication: No difficulties  Cognition Arousal/Alertness: Awake/alert Behavior During Therapy: WFL for tasks assessed/performed Overall Cognitive Status: Within Functional Limits for tasks assessed                      General Comments      Exercises Total Joint Exercises Ankle Circles/Pumps: AROM;Both;10 reps Quad Sets: AROM;Both;10 reps;Supine Heel Slides: AAROM;Right;10 reps   Assessment/Plan    PT Assessment Patient needs continued PT services  PT Problem  List Pain;Decreased range of motion;Decreased mobility       PT Treatment Interventions DME instruction;Gait training;Stair training;Functional mobility training;Therapeutic exercise;Therapeutic activities;Patient/family education    PT Goals (Current goals can be found in the Care Plan section)  Acute Rehab PT Goals Patient Stated Goal: work on cars, do yard work PT Goal Formulation: With patient Time For Goal Achievement:  02/18/17 Potential to Achieve Goals: Good    Frequency 7X/week   Barriers to discharge        Co-evaluation               End of Session Equipment Utilized During Treatment: Gait belt Activity Tolerance: Patient tolerated treatment well Patient left: in chair;with call bell/phone within reach Nurse Communication: Mobility status PT Visit Diagnosis: Pain;Other abnormalities of gait and mobility (R26.89) Pain - Right/Left: Right Pain - part of body: Knee         Time: 7412-8786 PT Time Calculation (min) (ACUTE ONLY): 29 min   Charges:   PT Evaluation $PT Eval Low Complexity: 1 Procedure PT Treatments $Gait Training: 8-22 mins   PT G Codes:         Philomena Doheny 02/04/2017, 9:59 AM [phone]

## 2017-02-04 NOTE — Evaluation (Signed)
Occupational Therapy Evaluation and Discharge Patient Details Name: Erik Holmes. MRN: [id number] DOB: 05/07/1961 Today's Date: 02/04/2017    History of Present Illness R TKA   Clinical Impression   Pt was assisted for LB dressing at baseline and was otherwise independent in self care and mobility. Pt presents with post operative pain, decreased standing balance requiring walker use and dependence in LB ADL. Educated pt in use of AE, shower transfer, safe footwear, multiple uses of 3 in 1 and transporting items safely with RW. Pt verbalizing understanding of all information provided. No further OT needs.    Follow Up Recommendations  No OT follow up    Equipment Recommendations  None recommended by OT    Recommendations for Other Services       Precautions / Restrictions Precautions Precautions: Fall;Knee Precaution Comments: denies falls in past 1 year; reviewed no pillow under knee Required Braces or Orthoses: Knee Immobilizer - Right Restrictions Weight Bearing Restrictions: No Other Position/Activity Restrictions: WBAT      Mobility Bed Mobility Overal bed mobility: Modified Independent             General bed mobility comments: HOB up  Transfers Overall transfer level: Needs assistance Equipment used: Rolling walker (2 wheeled) Transfers: Sit to/from Stand Sit to Stand: Min assist         General transfer comment: VCs hand placement, steadying assist    Balance Overall balance assessment: Modified Independent                                          ADL Overall ADL's : Needs assistance/impaired Eating/Feeding: Independent;Bed level   Grooming: Min guard;Standing   Upper Body Bathing: Set up;Sitting   Lower Body Bathing: Moderate assistance;Sit to/from stand Lower Body Bathing Details (indicate cue type and reason): recommended long handled bath sponge Upper Body Dressing : Set up;Sitting   Lower Body Dressing:  Moderate assistance;Sit to/from stand Lower Body Dressing Details (indicate cue type and reason): educated in use of sock aide and reacher as well as safe footwear Toilet Transfer: Min guard;Ambulation;RW Toilet Transfer Details (indicate cue type and reason): educated in use of 3 in 1 over toilet Toileting- Water quality scientist and Hygiene: Min guard;Sit to/from Nurse, children's Details (indicate cue type and reason): instructed to use his 3in 1 as shower seat and in shower transfer technique Functional mobility during ADLs: Min guard;Rolling walker General ADL Comments: Instructed in how to safely transport items with RW     Vision Patient Visual Report: No change from baseline       Perception     Praxis      Pertinent Vitals/Pain Pain Assessment: 0-10 Pain Score: 5  Pain Location: R knee Pain Descriptors / Indicators: Sore Pain Intervention(s): Monitored during session;Premedicated before session;Repositioned;Ice applied     Hand Dominance Right   Extremity/Trunk Assessment Upper Extremity Assessment Upper Extremity Assessment: Overall WFL for tasks assessed   Lower Extremity Assessment Lower Extremity Assessment: Defer to PT evaluation RLE Deficits / Details: 5-40* AAROM R knee , -3/5 SLR   Cervical / Trunk Assessment Cervical / Trunk Assessment: Normal   Communication Communication Communication: No difficulties   Cognition Arousal/Alertness: Awake/alert Behavior During Therapy: WFL for tasks assessed/performed Overall Cognitive Status: Within Functional Limits for tasks assessed  General Comments       Exercises       Shoulder Instructions      Home Living Family/patient expects to be discharged to:: Private residence Living Arrangements: Spouse/significant other Available Help at Discharge: Family;Available 24 hours/day Type of Home: House Home Access: Stairs to enter [employer] of Steps:  10 Entrance Stairs-Rails: Right Home Layout: Two level;1/2 bath on main level     Bathroom Shower/Tub: Occupational psychologist: Handicapped height     Home Equipment: Crutches;Bedside commode          Prior Functioning/Environment Level of Independence: Needs assistance  Gait / Transfers Assistance Needed: walked without a device ADL's / Homemaking Assistance Needed: wife was helping put on socks, wears slip on shoes, wife does housekeeping and cooking            OT Problem List: Decreased strength;Impaired balance (sitting and/or standing);Decreased knowledge of use of DME or AE;Pain;Obesity      OT Treatment/Interventions:      OT Goals(Current goals can be found in the care plan section) Acute Rehab OT Goals Patient Stated Goal: work on cars, do yard work  OT Frequency:     Barriers to D/C:            Co-evaluation              End of Glass blower/designer During Treatment: Rolling walker;Gait belt;Right knee immobilizer  Activity Tolerance: Patient tolerated treatment well Patient left: in bed;with call bell/phone within reach  OT Visit Diagnosis: Unsteadiness on feet (R26.81);Pain Pain - Right/Left: Right Pain - part of body: Knee                ADL either performed or assessed with clinical judgement  Time: 1152-1210 OT Time Calculation (min): 18 min Charges:  OT General Charges $OT Visit: 1 Procedure OT Evaluation $OT Eval Moderate Complexity: 1 Procedure G-Codes:     Malka So 02/04/2017, 12:18 PM  [phone]

## 2017-02-04 NOTE — Progress Notes (Signed)
Physical Therapy Treatment Patient Details Name: Erik Holmes. MRN: [id number] DOB: 1961-03-17 Today's Date: 02/04/2017    History of Present Illness R TKA    PT Comments    Pt ambulated 42' with RW, able to perform SLR independently. Progressing well with mobility. Will plan to do stair training tomorrow morning.   Follow Up Recommendations  Home health PT     Equipment Recommendations  Rolling walker with 5" wheels    Recommendations for Other Services       Precautions / Restrictions Precautions Precautions: Fall;Knee Precaution Comments: denies falls in past 1 year; reviewed no pillow under knee Required Braces or Orthoses: Knee Immobilizer - Right Restrictions Weight Bearing Restrictions: No Other Position/Activity Restrictions: WBAT    Mobility  Bed Mobility Overal bed mobility: Modified Independent             General bed mobility comments: HOB up  Transfers Overall transfer level: Needs assistance Equipment used: Rolling walker (2 wheeled) Transfers: Sit to/from Stand Sit to Stand: Min guard         General transfer comment: VCs hand placement  Ambulation/Gait Ambulation/Gait assistance: Min guard Ambulation Distance (Feet): 80 Feet Assistive device: Rolling walker (2 wheeled) Gait Pattern/deviations: Step-to pattern;Decreased step length - right;Decreased step length - left   Gait velocity interpretation: Below normal speed for age/gender General Gait Details: steady, distance limited by pain   Stairs            Wheelchair Mobility    Modified Rankin (Stroke Patients Only)       Balance Overall balance assessment: Modified Independent                                  Cognition Arousal/Alertness: Awake/alert Behavior During Therapy: WFL for tasks assessed/performed Overall Cognitive Status: Within Functional Limits for tasks assessed                      Exercises Total Joint  Exercises Ankle Circles/Pumps: AROM;Both;10 reps Quad Sets: AROM;Both;10 reps;Supine Short Arc Quad: AROM;Right;10 reps;Supine Heel Slides: AAROM;Right;10 reps;Supine Straight Leg Raises: AROM;Right;10 reps;Supine    General Comments        Pertinent Vitals/Pain Pain Assessment: 0-10 Pain Score: 5  Pain Location: R knee Pain Descriptors / Indicators: Sore Pain Intervention(s): Limited activity within patient's tolerance;Monitored during session;Premedicated before session;Ice applied    Home Living Family/patient expects to be discharged to:: Private residence Living Arrangements: Spouse/significant other Available Help at Discharge: Family;Available 24 hours/day Type of Home: House Home Access: Stairs to enter Entrance Stairs-Rails: Right Home Layout: Two level;1/2 bath on main level Home Equipment: Crutches;Bedside commode      Prior Function Level of Independence: Needs assistance  Gait / Transfers Assistance Needed: walked without a device ADL's / Homemaking Assistance Needed: wife was helping put on socks, wears slip on shoes, wife does housekeeping and cooking     PT Goals (current goals can now be found in the care plan section) Acute Rehab PT Goals Patient Stated Goal: work on cars, do yard work PT Goal Formulation: With patient Time For Goal Achievement: 02/18/17 Potential to Achieve Goals: Good Progress towards PT goals: Progressing toward goals    Frequency    7X/week      PT Plan Current plan remains appropriate    Co-evaluation             End of Session Equipment Utilized During  Treatment: Gait belt Activity Tolerance: Patient tolerated treatment well Patient left: in chair;with call bell/phone within reach Nurse Communication: Mobility status PT Visit Diagnosis: Pain;Other abnormalities of gait and mobility (R26.89) Pain - Right/Left: Right Pain - part of body: Knee     Time: 6438-3818 PT Time Calculation (min) (ACUTE ONLY): 14  min  Charges:  $Gait Training: 8-22 mins                    G Codes:       Philomena Doheny 02/04/2017, 1:49 PM [phone]

## 2017-02-04 NOTE — Care Management Note (Addendum)
Case Management Note  Patient Details  Name: Erik Holmes. MRN: [id number] Date of Birth: 1961/05/12  Subjective/Objective:    Right TKA                Action/Plan: Discharge Planning: NCM spoke to pt and states he has bedside commode at home. Wife at home to assist with care. Requested RW. Contacted [facility] DME rep for RW. Pt preoperatively arranged with [facility] for [facility] PT. Offered choice. Pt requested [facility]. Verified address, contacted [facility] Liaison with home address. [place].   PCP Gaynelle Arabian MD  Expected Discharge Date:  02/05/17               Expected Discharge Plan:  [place]  In-House Referral:  NA  Discharge planning Services  CM Consult  Post Acute Care Choice:  Home Health Choice offered to:  Patient  DME Arranged:  Walker rolling DME Agency:  [place]:  PT [place] Agency:  [facility] ([facility]  Status of Service:  Completed, signed off  If discussed at [place] of Stay Meetings, dates discussed:    Additional Comments:  Erenest Rasher, RN 02/04/2017, 12:37 PM

## 2017-02-05 LAB — CBC
HEMATOCRIT: 41.7 % (ref 39.0–52.0)
Hemoglobin: 13.6 g/dL (ref 13.0–17.0)
MCH: 28.6 pg (ref 26.0–34.0)
MCHC: 32.6 g/dL (ref 30.0–36.0)
MCV: 87.8 fL (ref 78.0–100.0)
Platelets: 263 10*3/uL (ref 150–400)
RBC: 4.75 MIL/uL (ref 4.22–5.81)
RDW: 13.6 % (ref 11.5–15.5)
WBC: 17.3 10*3/uL — AB (ref 4.0–10.5)

## 2017-02-05 LAB — BASIC METABOLIC PANEL
ANION GAP: 4 — AB (ref 5–15)
BUN: 15 mg/dL (ref 6–20)
CALCIUM: 8.7 mg/dL — AB (ref 8.9–10.3)
CO2: 27 mmol/L (ref 22–32)
CREATININE: 0.85 mg/dL (ref 0.61–1.24)
Chloride: 106 mmol/L (ref 101–111)
GFR calc Af Amer: >60 mL/min (ref >60)
GFR calc non Af Amer: >60 mL/min (ref >60)
GLUCOSE: 209 mg/dL — AB (ref 65–99)
Potassium: 4.4 mmol/L (ref 3.5–5.1)
Sodium: 137 mmol/L (ref 135–145)

## 2017-02-05 LAB — HEMOGLOBIN A1C
Hgb A1c MFr Bld: 6.9 % — ABNORMAL HIGH (ref 4.8–5.6)
Mean Plasma Glucose: 151 mg/dL

## 2017-02-05 NOTE — Progress Notes (Signed)
Physical Therapy Treatment Patient Details Name: Erik Holmes. MRN: [id number] DOB: Sep 18, 1961 Today's Date: 02/05/2017    History of Present Illness R TKA    PT Comments    Pt mobilizing well and eager for dc home.  Reviewed home therex and stairs with written information provided.   Follow Up Recommendations  Home health PT     Equipment Recommendations  Rolling walker with 5" wheels    Recommendations for Other Services       Precautions / Restrictions Precautions Precautions: Fall;Knee Precaution Comments: denies falls in past 1 year; reviewed no pillow under knee Required Braces or Orthoses: Knee Immobilizer - Right (Pt performed IND SLR ) Knee Immobilizer - Right: Discontinue once straight leg raise with < 10 degree lag Restrictions Weight Bearing Restrictions: No Other Position/Activity Restrictions: WBAT    Mobility  Bed Mobility Overal bed mobility: Modified Independent             General bed mobility comments: HOB up  Transfers Overall transfer level: Needs assistance Equipment used: Rolling walker (2 wheeled) Transfers: Sit to/from Stand Sit to Stand: Supervision         General transfer comment: VCs hand placement  Ambulation/Gait Ambulation/Gait assistance: Min guard;Supervision Ambulation Distance (Feet): 120 Feet Assistive device: Rolling walker (2 wheeled) Gait Pattern/deviations: Step-to pattern;Decreased step length - right;Decreased step length - left Gait velocity: decr Gait velocity interpretation: Below normal speed for age/gender General Gait Details: min cues for posture and position from [employer] Stairs: Yes   Stair Management: No rails;Step to pattern;Forwards;With crutches Number of Stairs: 4 General stair comments: cues for sequence and foot/crutch placement  Wheelchair Mobility    Modified Rankin (Stroke Patients Only)       Balance                                    Cognition  Arousal/Alertness: Awake/alert Behavior During Therapy: WFL for tasks assessed/performed Overall Cognitive Status: Within Functional Limits for tasks assessed                      Exercises Total Joint Exercises Ankle Circles/Pumps: AROM;Both;10 reps Quad Sets: AROM;Both;Supine;15 reps Short Arc Quad: AROM;Right;10 reps;Supine Heel Slides: AAROM;Right;Supine;15 reps Straight Leg Raises: Right;Supine;AAROM;AROM;20 reps Goniometric ROM: AAROM R knee -10 - 50    General Comments        Pertinent Vitals/Pain Pain Assessment: 0-10 Pain Score: 5  Pain Location: R knee Pain Descriptors / Indicators: Sore Pain Intervention(s): Limited activity within patient's tolerance;Monitored during session;Premedicated before session;Ice applied    Home Living                      Prior Function            PT Goals (current goals can now be found in the care plan section) Acute Rehab PT Goals Patient Stated Goal: work on cars, do yard work PT Goal Formulation: With patient Time For Goal Achievement: 02/18/17 Potential to Achieve Goals: Good Progress towards PT goals: Progressing toward goals    Frequency    7X/week      PT Plan Current plan remains appropriate    Co-evaluation             End of Session Equipment Utilized During Treatment: Gait belt Activity Tolerance: Patient tolerated treatment well Patient left: in chair;with call bell/phone within reach Nurse  Communication: Mobility status PT Visit Diagnosis: Pain;Other abnormalities of gait and mobility (R26.89) Pain - Right/Left: Right Pain - part of body: Knee     Time: 0902-0940 PT Time Calculation (min) (ACUTE ONLY): 38 min  Charges:  $Gait Training: 8-22 mins $Therapeutic Exercise: 8-22 mins $Therapeutic Activity: 8-22 mins                    G Codes:       Erik Holmes 23-Feb-2017, 10:39 AM

## 2017-02-05 NOTE — Progress Notes (Signed)
Subjective: 2 Days Post-Op Procedure(s) (LRB): RIGHT TOTAL KNEE ARTHROPLASTY (Right) Patient reports pain as mild to right knee.  Tolerating PO's. Progessing with PT. Denies SOB,CP,and calf pain  Objective: Vital signs in last 24 hours: Temp:  [97.8 F (36.6 C)-98.8 F (37.1 C)] 98.2 F (36.8 C) (03/18 0513) Pulse Rate:  [74-89] 74 (03/18 0513) Resp:  [16-18] 18 (03/18 0513) BP: (121-130)/(66-83) 121/66 (03/18 0513) SpO2:  [95 %-98 %] 98 % (03/18 0513)  Intake/Output from previous day: 03/17 0701 - 03/18 0700 In: 937.5 [P.O.:540; I.V.:397.5] Out: 3525 [Urine:3525] Intake/Output this shift: Total I/O In: 240 [P.O.:240] Out: -    Recent Labs  02/04/17 0437 02/05/17 0355  HGB 13.8 13.6    Recent Labs  02/04/17 0437 02/05/17 0355  WBC 19.9* 17.3*  RBC 4.87 4.75  HCT 43.0 41.7  PLT 290 263    Recent Labs  02/04/17 0437 02/05/17 0355  NA 134* 137  K 5.2* 4.4  CL 104 106  CO2 24 27  BUN 14 15  CREATININE 1.12 0.85  GLUCOSE 261* 209*  CALCIUM 8.4* 8.7*   No results for input(s): LABPT, INR in the last 72 hours.  Well nourished. Alert and oriented x3. RRR, Lungs clear, BS x4. Abdomen soft and non tender. Right Calf soft and non tender. Right knee dressing C/D/I. No DVT signs. Compartment soft. No signs of infection.  Right LE grossly neurovascular intact.  Assessment/Plan: 2 Days Post-Op Procedure(s) (LRB): RIGHT TOTAL KNEE ARTHROPLASTY (Right) D/c home Up with PT Follow instructions F/u in office STILWELL, BRYSON L 02/05/2017, 9:35 AM

## 2017-02-06 NOTE — Discharge Summary (Signed)
Physician Discharge Summary  Patient ID: Erik Holmes. MRN: [id number] DOB/AGE: 08-18-1961 56 y.o.  Admit date: 02/03/2017 Discharge date: 02/06/2017  Admission Diagnoses:right knee oa  Discharge Diagnoses:  Active Problems:   Primary osteoarthritis of one knee, right   S/P knee replacement   Discharged Condition: good  Hospital Course:  Erik Holmes. is a 56 y.o. who was admitted to [facility]. They were brought to the operating room on 02/03/2017 and underwent Procedure(s): RIGHT TOTAL KNEE ARTHROPLASTY.  Patient tolerated the procedure well and was later transferred to the recovery room and then to the orthopaedic floor for postoperative care.  They were given PO and IV analgesics for pain control following their surgery.  They were given 24 hours of postoperative antibiotics of  Anti-infectives    Start     Dose/Rate Route Frequency Ordered Stop   02/03/17 1800  vancomycin (VANCOCIN) IVPB 1000 mg/200 mL premix     1,000 mg 200 mL/hr over 60 Minutes Intravenous Every 12 hours 02/03/17 1134 02/03/17 1922   02/03/17 0600  vancomycin (VANCOCIN) 1,500 mg in sodium chloride 0.9 % 500 mL IVPB     1,500 mg 250 mL/hr over 120 Minutes Intravenous On call to O.R. 02/02/17 1336 02/03/17 0751     and started on DVT prophylaxis in the form of lovenox.   PT and OT were ordered for total joint protocol.  Discharge planning consulted to help with postop disposition and equipment needs.  Patient had a good night on the evening of surgery and started to get up OOB with therapy on day one.  Hemovac drain was pulled without difficulty.  Continued to work with therapy into day two.  Dressing was with normal limits.  The patient had progressed with therapy and meeting their goals. Patient was seen in rounds and was ready to go home.  Consults: n/a  Significant Diagnostic Studies: routine  Treatments: routine  Discharge Exam: Blood pressure 121/66, pulse 74, temperature 98.2  F (36.8 C), resp. rate 18, height 5\' 9"  (1.753 m), weight 120.7 kg (266 lb), SpO2 98 %. Well nourished. Alert and oriented x3. RRR, Lungs clear, BS x4. Abdomen soft and non tender. Right Calf soft and non tender. Right knee dressing C/D/I. No DVT signs. Compartment soft. No signs of infection.  Right LE grossly neurovascular intact.  Disposition: 01-Home or Self Care  Discharge Instructions    Call MD / Call 911    Complete by:  As directed    If you experience chest pain or shortness of breath, CALL 911 and be transported to the hospital emergency room.  If you develope a fever above 101 F, pus (white drainage) or increased drainage or redness at the wound, or calf pain, call your surgeon's office.   Constipation Prevention    Complete by:  As directed    Drink plenty of fluids.  Prune juice may be helpful.  You may use a stool softener, such as Colace (over the counter) 100 mg twice a day.  Use MiraLax (over the counter) for constipation as needed.   Diet - low sodium heart healthy    Complete by:  As directed    Discharge instructions    Complete by:  As directed    INSTRUCTIONS AFTER JOINT REPLACEMENT   Remove items at home which could result in a fall. This includes throw rugs or furniture in walking pathways ICE to the affected joint every three hours while awake for 30 minutes at  a time, for at least the first 3-5 days, and then as needed for pain and swelling.  Continue to use ice for pain and swelling. You may notice swelling that will progress down to the foot and ankle.  This is normal after surgery.  Elevate your leg when you are not up walking on it.   Continue to use the breathing machine you got in the hospital (incentive spirometer) which will help keep your temperature down.  It is common for your temperature to cycle up and down following surgery, especially at night when you are not up moving around and exerting yourself.  The breathing machine keeps your lungs expanded and  your temperature down.   DIET:  As you were doing prior to hospitalization, we recommend a well-balanced diet.  DRESSING / WOUND CARE / SHOWERING  Keep the surgical dressing until follow up.  The dressing is water proof, so you can shower without any extra covering.  IF THE DRESSING FALLS OFF or the wound gets wet inside, change the dressing with sterile gauze.  Please use good hand washing techniques before changing the dressing.  Do not use any lotions or creams on the incision until instructed by your surgeon.    ACTIVITY  Increase activity slowly as tolerated, but follow the weight bearing instructions below.   No driving for 6 weeks or until further direction given by your physician.  You cannot drive while taking narcotics.  No lifting or carrying greater than 10 lbs. until further directed by your surgeon. Avoid periods of inactivity such as sitting longer than an hour when not asleep. This helps prevent blood clots.  You may return to work once you are authorized by your doctor.     WEIGHT BEARING   Weight bearing as tolerated with assist device (walker, cane, etc) as directed, use it as long as suggested by your surgeon or therapist, typically at least 4-6 weeks.   EXERCISES  Results after joint replacement surgery are often greatly improved when you follow the exercise, range of motion and muscle strengthening exercises prescribed by your doctor. Safety measures are also important to protect the joint from further injury. Any time any of these exercises cause you to have increased pain or swelling, decrease what you are doing until you are comfortable again and then slowly increase them. If you have problems or questions, call your caregiver or physical therapist for advice.   Rehabilitation is important following a joint replacement. After just a few days of immobilization, the muscles of the leg can become weakened and shrink (atrophy).  These exercises are designed to build  up the tone and strength of the thigh and leg muscles and to improve motion. Often times heat used for twenty to thirty minutes before working out will loosen up your tissues and help with improving the range of motion but do not use heat for the first two weeks following surgery (sometimes heat can increase post-operative swelling).   These exercises can be done on a training (exercise) mat, on the floor, on a table or on a bed. Use whatever works the best and is most comfortable for you.    Use music or television while you are exercising so that the exercises are a pleasant break in your day. This will make your life better with the exercises acting as a break in your routine that you can look forward to.   Perform all exercises about fifteen times, three times per day or as directed.  You should exercise both the operative leg and the other leg as well.   Exercises include:   Quad Sets - Tighten up the muscle on the front of the thigh (Quad) and hold for 5-10 seconds.   Straight Leg Raises - With your knee straight (if you were given a brace, keep it on), lift the leg to 60 degrees, hold for 3 seconds, and slowly lower the leg.  Perform this exercise against resistance later as your leg gets stronger.  Leg Slides: Lying on your back, slowly slide your foot toward your buttocks, bending your knee up off the floor (only go as far as is comfortable). Then slowly slide your foot back down until your leg is flat on the floor again.  Angel Wings: Lying on your back spread your legs to the side as far apart as you can without causing discomfort.  Hamstring Strength:  Lying on your back, push your heel against the floor with your leg straight by tightening up the muscles of your buttocks.  Repeat, but this time bend your knee to a comfortable angle, and push your heel against the floor.  You may put a pillow under the heel to make it more comfortable if necessary.   A rehabilitation program following joint  replacement surgery can speed recovery and prevent re-injury in the future due to weakened muscles. Contact your doctor or a physical therapist for more information on knee rehabilitation.    CONSTIPATION  Constipation is defined medically as fewer than three stools per week and severe constipation as less than one stool per week.  Even if you have a regular bowel pattern at home, your normal regimen is likely to be disrupted due to multiple reasons following surgery.  Combination of anesthesia, postoperative narcotics, change in appetite and fluid intake all can affect your bowels.   YOU MUST use at least one of the following options; they are listed in order of increasing strength to get the job done.  They are all available over the counter, and you may need to use some, POSSIBLY even all of these options:    Drink plenty of fluids (prune juice may be helpful) and high fiber foods Colace 100 mg by mouth twice a day  Senokot for constipation as directed and as needed Dulcolax (bisacodyl), take with full glass of water  Miralax (polyethylene glycol) once or twice a day as needed.  If you have tried all these things and are unable to have a bowel movement in the first 3-4 days after surgery call either your surgeon or your primary doctor.    If you experience loose stools or diarrhea, hold the medications until you stool forms back up.  If your symptoms do not get better within 1 week or if they get worse, check with your doctor.  If you experience "the worst abdominal pain ever" or develop nausea or vomiting, please contact the office immediately for further recommendations for treatment.   ITCHING:  If you experience itching with your medications, try taking only a single pain pill, or even half a pain pill at a time.  You can also use Benadryl over the counter for itching or also to help with sleep.   TED HOSE STOCKINGS:  Use stockings on both legs until for at least 2 weeks or as directed by  physician office. They may be removed at night for sleeping.  MEDICATIONS:  See your medication summary on the "After Visit Summary" that nursing will review with  you.  You may have some home medications which will be placed on hold until you complete the course of blood thinner medication.  It is important for you to complete the blood thinner medication as prescribed.  PRECAUTIONS:  If you experience chest pain or shortness of breath - call 911 immediately for transfer to the hospital emergency department.   If you develop a fever greater that 101 F, purulent drainage from wound, increased redness or drainage from wound, foul odor from the wound/dressing, or calf pain - CONTACT YOUR SURGEON.                                                   FOLLOW-UP APPOINTMENTS:  If you do not already have a post-op appointment, please call the office for an appointment to be seen by your surgeon.  Guidelines for how soon to be seen are listed in your "After Visit Summary", but are typically between 1-4 weeks after surgery.  OTHER INSTRUCTIONS:   Knee Replacement:  Do not place pillow under knee, focus on keeping the knee straight while resting. CPM instructions: 0-90 degrees, 2 hours in the morning, 2 hours in the afternoon, and 2 hours in the evening. Place foam block, curve side up under heel at all times except when in CPM or when walking.  DO NOT modify, tear, cut, or change the foam block in any way.  MAKE SURE YOU:  Understand these instructions.  Get help right away if you are not doing well or get worse.    Thank you for letting us be a part of your medical care team.  It is a privilege we respect greatly.  We hope these instructions will help you stay on track for a fast and full recovery!   Increase activity slowly as tolerated    Complete by:  As directed      Allergies as of 02/05/2017      Reactions   Penicillins Anaphylaxis, Other (See Comments)   Has patient had a PCN reaction causing  immediate rash, facial/tongue/throat swelling, SOB or lightheadedness with hypotension:Yes Has patient had a PCN reaction causing severe rash involving mucus membranes or skin necrosis:No Has patient had a PCN reaction that required hospitalization:Treated in ER for reaction Has patient had a PCN reaction occurring within the last 10 years:Yes If all of the above answers are "NO", then may proceed with Cephalosporin use.   Codeine Other (See Comments)   Made pt feel like skin was crawling   Other Other (See Comments)   "bee stings"   Sildenafil Citrate Other (See Comments)   Xanax [alprazolam]    Made him feel angry      Medication List    TAKE these medications   ARIPiprazole 10 MG tablet Commonly known as:  ABILIFY Take 10 mg by mouth daily.   aspirin EC 325 MG tablet Take 1 tablet (325 mg total) by mouth 2 (two) times daily.   CIALIS 20 MG tablet Generic drug:  tadalafil Take 10-20 mg by mouth daily as needed for erectile dysfunction.   EPIPEN 2-PAK 0.3 mg/0.3 mL Soaj injection Generic drug:  EPINEPHrine Take 0.3 mg by mouth daily as needed for anaphylaxis.   lisinopril 10 MG tablet Commonly known as:  PRINIVIL,ZESTRIL Take 10 mg by mouth daily.   lithium carbonate 150 MG capsule Take 300  mg by mouth at bedtime.   methocarbamol 500 MG tablet Commonly known as:  ROBAXIN Take 1 tablet (500 mg total) by mouth every 6 (six) hours as needed for muscle spasms.   NON FORMULARY CPAP AT BEDTIME   oxyCODONE 5 MG immediate release tablet Commonly known as:  ROXICODONE Take 2-3 tablets (10-15 mg total) by mouth every 4 (four) hours as needed for severe pain or breakthrough pain. What changed:  medication strength  how much to take  when to take this  reasons to take this   temazepam 30 MG capsule Commonly known as:  RESTORIL Take 30 mg by mouth at bedtime.   venlafaxine XR 150 MG 24 hr capsule Commonly known as:  EFFEXOR-XR Take 300 mg by mouth daily with  breakfast.      Follow-up Information    [facility] Follow up.   Specialty:  Home Health Services Why:  Home Health Physical Therapy, agency will call you to arrange visit Contact information: [place] [address] [phone]           Signed: Lajean Manes 02/06/2017, 8:34 AM

## 2017-02-07 DIAGNOSIS — M109 Gout, unspecified: Secondary | ICD-10-CM | POA: Diagnosis not present

## 2017-02-07 DIAGNOSIS — M5136 Other intervertebral disc degeneration, lumbar region: Secondary | ICD-10-CM | POA: Diagnosis not present

## 2017-02-07 DIAGNOSIS — F419 Anxiety disorder, unspecified: Secondary | ICD-10-CM | POA: Diagnosis not present

## 2017-02-07 DIAGNOSIS — G2581 Restless legs syndrome: Secondary | ICD-10-CM | POA: Diagnosis not present

## 2017-02-07 DIAGNOSIS — Z471 Aftercare following joint replacement surgery: Secondary | ICD-10-CM | POA: Diagnosis not present

## 2017-02-07 DIAGNOSIS — F329 Major depressive disorder, single episode, unspecified: Secondary | ICD-10-CM | POA: Diagnosis not present

## 2017-02-08 ENCOUNTER — Encounter (HOSPITAL_COMMUNITY): Payer: Self-pay | Admitting: Specialist

## 2017-02-14 DIAGNOSIS — Z471 Aftercare following joint replacement surgery: Secondary | ICD-10-CM | POA: Diagnosis not present

## 2017-02-14 DIAGNOSIS — F419 Anxiety disorder, unspecified: Secondary | ICD-10-CM | POA: Diagnosis not present

## 2017-02-14 DIAGNOSIS — M109 Gout, unspecified: Secondary | ICD-10-CM | POA: Diagnosis not present

## 2017-02-14 DIAGNOSIS — F329 Major depressive disorder, single episode, unspecified: Secondary | ICD-10-CM | POA: Diagnosis not present

## 2017-02-14 DIAGNOSIS — G2581 Restless legs syndrome: Secondary | ICD-10-CM | POA: Diagnosis not present

## 2017-02-14 DIAGNOSIS — M5136 Other intervertebral disc degeneration, lumbar region: Secondary | ICD-10-CM | POA: Diagnosis not present

## 2017-02-15 DIAGNOSIS — M109 Gout, unspecified: Secondary | ICD-10-CM | POA: Diagnosis not present

## 2017-02-15 DIAGNOSIS — M5136 Other intervertebral disc degeneration, lumbar region: Secondary | ICD-10-CM | POA: Diagnosis not present

## 2017-02-15 DIAGNOSIS — G2581 Restless legs syndrome: Secondary | ICD-10-CM | POA: Diagnosis not present

## 2017-02-15 DIAGNOSIS — F329 Major depressive disorder, single episode, unspecified: Secondary | ICD-10-CM | POA: Diagnosis not present

## 2017-02-15 DIAGNOSIS — F419 Anxiety disorder, unspecified: Secondary | ICD-10-CM | POA: Diagnosis not present

## 2017-02-15 DIAGNOSIS — Z471 Aftercare following joint replacement surgery: Secondary | ICD-10-CM | POA: Diagnosis not present

## 2017-02-16 DIAGNOSIS — F329 Major depressive disorder, single episode, unspecified: Secondary | ICD-10-CM | POA: Diagnosis not present

## 2017-02-16 DIAGNOSIS — M109 Gout, unspecified: Secondary | ICD-10-CM | POA: Diagnosis not present

## 2017-02-16 DIAGNOSIS — F419 Anxiety disorder, unspecified: Secondary | ICD-10-CM | POA: Diagnosis not present

## 2017-02-16 DIAGNOSIS — G2581 Restless legs syndrome: Secondary | ICD-10-CM | POA: Diagnosis not present

## 2017-02-16 DIAGNOSIS — Z471 Aftercare following joint replacement surgery: Secondary | ICD-10-CM | POA: Diagnosis not present

## 2017-02-16 DIAGNOSIS — M5136 Other intervertebral disc degeneration, lumbar region: Secondary | ICD-10-CM | POA: Diagnosis not present

## 2017-02-17 DIAGNOSIS — M5136 Other intervertebral disc degeneration, lumbar region: Secondary | ICD-10-CM | POA: Diagnosis not present

## 2017-02-17 DIAGNOSIS — F419 Anxiety disorder, unspecified: Secondary | ICD-10-CM | POA: Diagnosis not present

## 2017-02-17 DIAGNOSIS — F329 Major depressive disorder, single episode, unspecified: Secondary | ICD-10-CM | POA: Diagnosis not present

## 2017-02-17 DIAGNOSIS — M109 Gout, unspecified: Secondary | ICD-10-CM | POA: Diagnosis not present

## 2017-02-17 DIAGNOSIS — G2581 Restless legs syndrome: Secondary | ICD-10-CM | POA: Diagnosis not present

## 2017-02-17 DIAGNOSIS — Z471 Aftercare following joint replacement surgery: Secondary | ICD-10-CM | POA: Diagnosis not present

## 2017-02-20 DIAGNOSIS — Z96651 Presence of right artificial knee joint: Secondary | ICD-10-CM | POA: Diagnosis not present

## 2017-02-20 DIAGNOSIS — Z471 Aftercare following joint replacement surgery: Secondary | ICD-10-CM | POA: Diagnosis not present

## 2017-02-22 DIAGNOSIS — Z471 Aftercare following joint replacement surgery: Secondary | ICD-10-CM | POA: Diagnosis not present

## 2017-02-22 DIAGNOSIS — M5136 Other intervertebral disc degeneration, lumbar region: Secondary | ICD-10-CM | POA: Diagnosis not present

## 2017-02-22 DIAGNOSIS — G2581 Restless legs syndrome: Secondary | ICD-10-CM | POA: Diagnosis not present

## 2017-02-22 DIAGNOSIS — F419 Anxiety disorder, unspecified: Secondary | ICD-10-CM | POA: Diagnosis not present

## 2017-02-22 DIAGNOSIS — F329 Major depressive disorder, single episode, unspecified: Secondary | ICD-10-CM | POA: Diagnosis not present

## 2017-02-22 DIAGNOSIS — M109 Gout, unspecified: Secondary | ICD-10-CM | POA: Diagnosis not present

## 2017-02-24 DIAGNOSIS — Z471 Aftercare following joint replacement surgery: Secondary | ICD-10-CM | POA: Diagnosis not present

## 2017-02-24 DIAGNOSIS — F329 Major depressive disorder, single episode, unspecified: Secondary | ICD-10-CM | POA: Diagnosis not present

## 2017-02-24 DIAGNOSIS — G2581 Restless legs syndrome: Secondary | ICD-10-CM | POA: Diagnosis not present

## 2017-02-24 DIAGNOSIS — M5136 Other intervertebral disc degeneration, lumbar region: Secondary | ICD-10-CM | POA: Diagnosis not present

## 2017-02-24 DIAGNOSIS — F419 Anxiety disorder, unspecified: Secondary | ICD-10-CM | POA: Diagnosis not present

## 2017-02-24 DIAGNOSIS — M109 Gout, unspecified: Secondary | ICD-10-CM | POA: Diagnosis not present

## 2017-02-28 DIAGNOSIS — M1711 Unilateral primary osteoarthritis, right knee: Secondary | ICD-10-CM | POA: Diagnosis not present

## 2017-03-07 DIAGNOSIS — F332 Major depressive disorder, recurrent severe without psychotic features: Secondary | ICD-10-CM | POA: Diagnosis not present

## 2017-03-13 DIAGNOSIS — G894 Chronic pain syndrome: Secondary | ICD-10-CM | POA: Diagnosis not present

## 2017-03-20 DIAGNOSIS — Z471 Aftercare following joint replacement surgery: Secondary | ICD-10-CM | POA: Diagnosis not present

## 2017-03-20 DIAGNOSIS — Z96651 Presence of right artificial knee joint: Secondary | ICD-10-CM | POA: Diagnosis not present

## 2017-04-04 DIAGNOSIS — F332 Major depressive disorder, recurrent severe without psychotic features: Secondary | ICD-10-CM | POA: Diagnosis not present

## 2017-04-18 DIAGNOSIS — Z13220 Encounter for screening for lipoid disorders: Secondary | ICD-10-CM | POA: Diagnosis not present

## 2017-04-18 DIAGNOSIS — G2581 Restless legs syndrome: Secondary | ICD-10-CM | POA: Diagnosis not present

## 2017-04-18 DIAGNOSIS — R5383 Other fatigue: Secondary | ICD-10-CM | POA: Diagnosis not present

## 2017-04-18 DIAGNOSIS — Z86010 Personal history of colonic polyps: Secondary | ICD-10-CM | POA: Diagnosis not present

## 2017-04-18 DIAGNOSIS — I10 Essential (primary) hypertension: Secondary | ICD-10-CM | POA: Diagnosis not present

## 2017-04-18 DIAGNOSIS — Z0000 Encounter for general adult medical examination without abnormal findings: Secondary | ICD-10-CM | POA: Diagnosis not present

## 2017-04-18 DIAGNOSIS — R7301 Impaired fasting glucose: Secondary | ICD-10-CM | POA: Diagnosis not present

## 2017-04-18 DIAGNOSIS — E669 Obesity, unspecified: Secondary | ICD-10-CM | POA: Diagnosis not present

## 2017-04-18 DIAGNOSIS — F411 Generalized anxiety disorder: Secondary | ICD-10-CM | POA: Diagnosis not present

## 2017-04-18 DIAGNOSIS — N529 Male erectile dysfunction, unspecified: Secondary | ICD-10-CM | POA: Diagnosis not present

## 2017-04-18 DIAGNOSIS — G4733 Obstructive sleep apnea (adult) (pediatric): Secondary | ICD-10-CM | POA: Diagnosis not present

## 2017-04-18 DIAGNOSIS — K219 Gastro-esophageal reflux disease without esophagitis: Secondary | ICD-10-CM | POA: Diagnosis not present

## 2017-05-03 DIAGNOSIS — E1165 Type 2 diabetes mellitus with hyperglycemia: Secondary | ICD-10-CM | POA: Diagnosis not present

## 2017-05-05 DIAGNOSIS — F332 Major depressive disorder, recurrent severe without psychotic features: Secondary | ICD-10-CM | POA: Diagnosis not present

## 2017-05-16 DIAGNOSIS — F332 Major depressive disorder, recurrent severe without psychotic features: Secondary | ICD-10-CM | POA: Diagnosis not present

## 2017-06-09 DIAGNOSIS — F332 Major depressive disorder, recurrent severe without psychotic features: Secondary | ICD-10-CM | POA: Diagnosis not present

## 2017-06-13 ENCOUNTER — Ambulatory Visit: Payer: Medicare Other

## 2017-06-20 ENCOUNTER — Ambulatory Visit: Payer: Medicare Other

## 2017-06-27 ENCOUNTER — Ambulatory Visit: Payer: Medicare Other

## 2017-06-29 ENCOUNTER — Ambulatory Visit: Payer: Medicare Other

## 2017-07-06 ENCOUNTER — Ambulatory Visit: Payer: Medicare Other

## 2017-07-07 DIAGNOSIS — F332 Major depressive disorder, recurrent severe without psychotic features: Secondary | ICD-10-CM | POA: Diagnosis not present

## 2017-07-13 ENCOUNTER — Ambulatory Visit: Payer: Medicare Other

## 2017-08-16 DIAGNOSIS — Z09 Encounter for follow-up examination after completed treatment for conditions other than malignant neoplasm: Secondary | ICD-10-CM | POA: Diagnosis not present

## 2017-08-16 DIAGNOSIS — Z471 Aftercare following joint replacement surgery: Secondary | ICD-10-CM | POA: Diagnosis not present

## 2017-08-16 DIAGNOSIS — Z96651 Presence of right artificial knee joint: Secondary | ICD-10-CM | POA: Diagnosis not present

## 2017-09-04 DIAGNOSIS — Z7984 Long term (current) use of oral hypoglycemic drugs: Secondary | ICD-10-CM | POA: Diagnosis not present

## 2017-09-04 DIAGNOSIS — F322 Major depressive disorder, single episode, severe without psychotic features: Secondary | ICD-10-CM | POA: Diagnosis not present

## 2017-09-04 DIAGNOSIS — E119 Type 2 diabetes mellitus without complications: Secondary | ICD-10-CM | POA: Diagnosis not present

## 2017-09-05 DIAGNOSIS — F332 Major depressive disorder, recurrent severe without psychotic features: Secondary | ICD-10-CM | POA: Diagnosis not present

## 2017-09-07 DIAGNOSIS — E1165 Type 2 diabetes mellitus with hyperglycemia: Secondary | ICD-10-CM | POA: Diagnosis not present

## 2017-09-07 DIAGNOSIS — Z7984 Long term (current) use of oral hypoglycemic drugs: Secondary | ICD-10-CM | POA: Diagnosis not present

## 2017-09-07 DIAGNOSIS — Z23 Encounter for immunization: Secondary | ICD-10-CM | POA: Diagnosis not present

## 2017-09-11 DIAGNOSIS — G4733 Obstructive sleep apnea (adult) (pediatric): Secondary | ICD-10-CM | POA: Diagnosis not present

## 2017-10-03 ENCOUNTER — Ambulatory Visit: Payer: Medicare Other

## 2017-10-05 DIAGNOSIS — Z23 Encounter for immunization: Secondary | ICD-10-CM | POA: Diagnosis not present

## 2017-10-05 DIAGNOSIS — E1165 Type 2 diabetes mellitus with hyperglycemia: Secondary | ICD-10-CM | POA: Diagnosis not present

## 2017-10-10 ENCOUNTER — Ambulatory Visit: Payer: Medicare Other

## 2017-10-17 ENCOUNTER — Ambulatory Visit: Payer: Medicare Other

## 2017-10-17 DIAGNOSIS — F332 Major depressive disorder, recurrent severe without psychotic features: Secondary | ICD-10-CM | POA: Diagnosis not present

## 2018-01-08 DIAGNOSIS — E1165 Type 2 diabetes mellitus with hyperglycemia: Secondary | ICD-10-CM | POA: Diagnosis not present

## 2018-01-09 DIAGNOSIS — F332 Major depressive disorder, recurrent severe without psychotic features: Secondary | ICD-10-CM | POA: Diagnosis not present

## 2018-02-13 ENCOUNTER — Ambulatory Visit (INDEPENDENT_AMBULATORY_CARE_PROVIDER_SITE_OTHER): Payer: Medicare Other | Admitting: Family Medicine

## 2018-02-13 ENCOUNTER — Encounter: Payer: Self-pay | Admitting: Family Medicine

## 2018-02-13 ENCOUNTER — Other Ambulatory Visit: Payer: Self-pay

## 2018-02-13 VITALS — BP 132/92 | HR 96 | Temp 98.4°F | Ht 69.5 in | Wt 240.0 lb

## 2018-02-13 DIAGNOSIS — F17200 Nicotine dependence, unspecified, uncomplicated: Secondary | ICD-10-CM | POA: Diagnosis not present

## 2018-02-13 DIAGNOSIS — J0100 Acute maxillary sinusitis, unspecified: Secondary | ICD-10-CM | POA: Diagnosis not present

## 2018-02-13 DIAGNOSIS — R062 Wheezing: Secondary | ICD-10-CM

## 2018-02-13 MED ORDER — ALBUTEROL SULFATE HFA 108 (90 BASE) MCG/ACT IN AERS: 2.0000 | INHALATION_SPRAY | Freq: Four times a day (QID) | RESPIRATORY_TRACT | 0 refills | Status: DC | PRN

## 2018-02-13 MED ORDER — DOXYCYCLINE HYCLATE 100 MG PO TABS: 100.0000 mg | ORAL_TABLET | Freq: Two times a day (BID) | ORAL | 0 refills | Status: DC

## 2018-02-13 NOTE — Patient Instructions (Addendum)
1. Start nasal saline washes 2-3 times day, also consider adding allergy medication if you start having more sneezing, itchy eyes, ears, etc.     IF you received an x-ray today, you will receive an invoice from [facility]. Please contact [facility] at [phone] with questions or concerns regarding your invoice.   IF you received labwork today, you will receive an invoice from [facility]. Please contact [facility] at [phone] with questions or concerns regarding your invoice.   Our billing staff will not be able to assist you with questions regarding bills from these companies.  You will be contacted with the lab results as soon as they are available. The fastest way to get your results is to activate your My Chart account. Instructions are located on the last page of this paperwork. If you have not heard from [place] regarding the results in 2 weeks, please contact this office.

## 2018-02-13 NOTE — Progress Notes (Signed)
3/26/201910:11 AM  Erik Holmes. 10/04/1961, 57 y.o. male [id number]  Chief Complaint  Patient presents with  . Sinus Problem    for the past 3-4 days has been worsening, having sinus pressure in the throat. Having itching in the throat. SOB when trying to sleep with cpap mask. Also, needs relief for restless leg syndrome    HPI:   Patient is a 56 y.o. male with past medical history significant for tobacco use, OSA and depression who presents today for left sided sinus pressure with green mucous over a week with worsening over the past 3-4 days. No fevers, occasional chills. Mild ear pressure and sore throat. Mostly non productive cough. He has also been experiencing SOB, mostly at night when he tries to wear his cpap mask. Normally he tolerates it well without any issues. States that despite daily smoking he normally does not have a cough. He denies any sinus surgery.  Patient Care Team: Gaynelle Arabian, MD as PCP - General (Family Medicine)  Depression screen [facility] 2/9 02/13/2018  Decreased Interest 0  Down, Depressed, Hopeless 0  PHQ - 2 Score 0    Allergies  Allergen Reactions  . Penicillins Anaphylaxis and Other (See Comments)    Has patient had a PCN reaction causing immediate rash, facial/tongue/throat swelling, SOB or lightheadedness with hypotension:Yes Has patient had a PCN reaction causing severe rash involving mucus membranes or skin necrosis:No Has patient had a PCN reaction that required hospitalization:Treated in ER for reaction Has patient had a PCN reaction occurring within the last 10 years:Yes If all of the above answers are "NO", then may proceed with Cephalosporin use.   . Codeine Other (See Comments)    Made pt feel like skin was crawling  . Other Other (See Comments)    "bee stings"  . Sildenafil Citrate Other (See Comments)  . Xanax [Alprazolam]     Made him feel angry    Prior to Admission medications   Medication Sig Start Date End Date  Taking? Authorizing Provider  ARIPiprazole (ABILIFY) 10 MG tablet Take 10 mg by mouth daily.   Yes [provider]  aspirin EC 325 MG tablet Take 1 tablet (325 mg total) by mouth 2 (two) times daily. 02/03/17  Yes Stilwell, Bryson L, PA-C  EPINEPHrine (EPIPEN 2-PAK) 0.3 mg/0.3 mL IJ SOAJ injection Take 0.3 mg by mouth daily as needed for anaphylaxis.   Yes [provider]  lisinopril (PRINIVIL,ZESTRIL) 10 MG tablet Take 10 mg by mouth daily. 02/20/15  Yes [provider]  lithium carbonate 150 MG capsule Take 300 mg by mouth at bedtime.   Yes [provider]  methocarbamol (ROBAXIN) 500 MG tablet Take 1 tablet (500 mg total) by mouth every 6 (six) hours as needed for muscle spasms. 02/03/17  Yes Stilwell, Bryson L, PA-C  NON FORMULARY CPAP AT BEDTIME 02/24/10  Yes [provider]  tadalafil (CIALIS) 20 MG tablet Take 10-20 mg by mouth daily as needed for erectile dysfunction.   Yes [provider]  temazepam (RESTORIL) 30 MG capsule Take 30 mg by mouth at bedtime.   Yes [provider]  venlafaxine XR (EFFEXOR-XR) 150 MG 24 hr capsule Take 300 mg by mouth daily with breakfast.   Yes [provider]    Past Medical History:  Diagnosis Date  . Anxiety   . Degenerative disc disease, lumbar   . Depression   . ED (erectile dysfunction)   . GERD (gastroesophageal reflux disease)   .  Gout   . Internal hemorrhoids with prolapse, bleeding 04/24/2014  . RLS (restless legs syndrome)   . Shortness of breath   . Sleep apnea    cpap  . Tubular adenoma of colon    multiple, TV adenomas also    Past Surgical History:  Procedure Laterality Date  . ANTERIOR LAT LUMBAR FUSION Left 04/30/2013   Procedure: ANTERIOR LATERAL LUMBAR FUSION 1 LEVEL;  Surgeon: Erline Levine, MD;  Location: [place] [facility];  Service: Neurosurgery;  Laterality: Left;  Lumbar three-four  Anterolateral decompression/fusion/percutaneous pedicle screws   . BACK SURGERY      . [place]   90's  neg  . COLONOSCOPY  multiple   Dr. Cranston Neighbor  . EYE SURGERY     cataracts bilateral with lens placement  . hemorrhoid banding  2015  . LUMBAR PERCUTANEOUS PEDICLE SCREW 1 LEVEL N/A 04/30/2013   Procedure: LUMBAR PERCUTANEOUS PEDICLE SCREW 1 LEVEL;  Surgeon: Erline Levine, MD;  Location: [place] [facility];  Service: Neurosurgery;  Laterality: N/A;  Lumbar three-four  Anterolateral decompression/fusion/percutaneous pedicle screws  . SPINE SURGERY  2008   lumbar fusion  . TOTAL KNEE ARTHROPLASTY Right 02/03/2017   Procedure: RIGHT TOTAL KNEE ARTHROPLASTY;  Surgeon: Sydnee Cabal, MD;  Location: [facility];  Service: Orthopedics;  Laterality: Right;    Social History   Tobacco Use  . Smoking status: Current Every Day Smoker    Packs/day: 2.00    Years: 40.00    Pack years: 80.00    Types: Cigarettes    Last attempt to quit: 11/18/2013    Years since quitting: 4.2  . Smokeless tobacco: Never Used  Substance Use Topics  . Alcohol use: No    Family History  Problem Relation Age of Onset  . Cancer Mother   . Breast cancer Mother   . Osteoporosis Mother   . Heart disease Father     ROS Per hpi Neg chest pain, palpittaions Neg nausea, vomiting Neg malaise or fatigue  OBJECTIVE:  Blood pressure (!) 132/92, pulse 96, temperature 98.4 F (36.9 C), temperature source Oral, height 5' 9.5" (1.765 m), weight 240 lb (108.9 kg), SpO2 96 %.  Physical Exam  Constitutional: He is oriented to person, place, and time and well-developed, well-nourished, and in no distress.  HENT:  Head: Normocephalic and atraumatic.  Right Ear: Hearing, tympanic membrane, external ear and ear canal normal.  Left Ear: Hearing, tympanic membrane, external ear and ear canal normal.  Nose: Rhinorrhea present. No mucosal edema. Right sinus exhibits no maxillary sinus tenderness and no frontal sinus tenderness. Left sinus exhibits maxillary sinus tenderness (mild). Left  sinus exhibits no frontal sinus tenderness.  Mouth/Throat: Oropharynx is clear and moist. No oropharyngeal exudate.  Eyes: Pupils are equal, round, and reactive to light. Conjunctivae and EOM are normal.  Neck: Neck supple.  Cardiovascular: Normal rate and regular rhythm. Exam reveals no gallop and no friction rub.  No murmur heard. Pulmonary/Chest: Effort normal. He has wheezes. He has no rales.  Lymphadenopathy:    He has no cervical adenopathy.  Neurological: He is alert and oriented to person, place, and time. Gait normal.  Skin: Skin is warm and dry.     ASSESSMENT and PLAN  1. Acute non-recurrent maxillary sinusitis Discussed supportive measures, new meds r/se/b and RTC precautions.  2. Wheezing 3. Tobacco use disorder Patient not interested in quitting.   Avised patient address concerns for RLS with sleep medicine physician given current meds for depression.  Other orders - doxycycline (VIBRA-TABS) 100 MG tablet; Take 1 tablet (100 mg total) by mouth 2 (two) times daily. - albuterol (PROVENTIL HFA;VENTOLIN HFA) 108 (90 Base) MCG/ACT inhaler; Inhale 2 puffs into the lungs every 6 (six) hours as needed for wheezing or shortness of breath.  Return in about 1 week (around 02/20/2018) for PCP.    Rutherford Guys, MD [facility] at [facility] [place],  [address] Ph.  [phone] Fax [phone]

## 2018-03-05 ENCOUNTER — Other Ambulatory Visit: Payer: Self-pay | Admitting: Family Medicine

## 2018-03-15 ENCOUNTER — Encounter: Payer: Self-pay | Admitting: Internal Medicine

## 2018-04-01 DIAGNOSIS — E119 Type 2 diabetes mellitus without complications: Secondary | ICD-10-CM | POA: Diagnosis not present

## 2018-04-01 DIAGNOSIS — H109 Unspecified conjunctivitis: Secondary | ICD-10-CM | POA: Diagnosis not present

## 2018-04-01 DIAGNOSIS — R062 Wheezing: Secondary | ICD-10-CM | POA: Diagnosis not present

## 2018-04-01 DIAGNOSIS — J988 Other specified respiratory disorders: Secondary | ICD-10-CM | POA: Diagnosis not present

## 2018-04-03 DIAGNOSIS — F332 Major depressive disorder, recurrent severe without psychotic features: Secondary | ICD-10-CM | POA: Diagnosis not present

## 2018-05-02 DIAGNOSIS — G4733 Obstructive sleep apnea (adult) (pediatric): Secondary | ICD-10-CM | POA: Diagnosis not present

## 2018-05-02 DIAGNOSIS — G2581 Restless legs syndrome: Secondary | ICD-10-CM | POA: Diagnosis not present

## 2018-05-22 ENCOUNTER — Ambulatory Visit (AMBULATORY_SURGERY_CENTER): Payer: Self-pay

## 2018-05-22 ENCOUNTER — Other Ambulatory Visit: Payer: Self-pay

## 2018-05-22 VITALS — Ht 69.0 in | Wt 251.0 lb

## 2018-05-22 DIAGNOSIS — Z86010 Personal history of colonic polyps: Secondary | ICD-10-CM

## 2018-05-22 NOTE — Progress Notes (Signed)
Denies allergies to eggs or soy products. Denies complication of anesthesia or sedation. Denies use of weight loss medication. Denies use of O2.   Emmi instructions declined.  

## 2018-06-05 ENCOUNTER — Ambulatory Visit (AMBULATORY_SURGERY_CENTER): Payer: Medicare Other | Admitting: Internal Medicine

## 2018-06-05 ENCOUNTER — Encounter: Payer: Self-pay | Admitting: Internal Medicine

## 2018-06-05 VITALS — BP 104/65 | HR 61 | Temp 98.7°F | Resp 11 | Ht 69.0 in | Wt 251.0 lb

## 2018-06-05 DIAGNOSIS — D123 Benign neoplasm of transverse colon: Secondary | ICD-10-CM

## 2018-06-05 DIAGNOSIS — Z86010 Personal history of colonic polyps: Secondary | ICD-10-CM

## 2018-06-05 MED ORDER — SODIUM CHLORIDE 0.9 % IV SOLN: 500.0000 mL | Freq: Once | INTRAVENOUS | Status: DC

## 2018-06-05 NOTE — Progress Notes (Signed)
Pt's states no medical or surgical changes since previsit or office visit. 

## 2018-06-05 NOTE — Progress Notes (Signed)
Called to room to assist during endoscopic procedure.  Patient ID and intended procedure confirmed with present staff. Received instructions for my participation in the procedure from the performing physician.  

## 2018-06-05 NOTE — Patient Instructions (Addendum)
   I found and removed 5 tiny polyps  Hemorrhoids seen again. I can do some more work if you need - please make an appointment  I appreciate the opportunity to care for you. Gatha Mayer, MD, [facility]   **handouts given on polyps and Hemorrhoids**   YOU HAD AN ENDOSCOPIC PROCEDURE TODAY: Refer to the procedure report and other information in the discharge instructions given to you for any specific questions about what was found during the examination. If this information does not answer your questions, please call [place] office at [phone] to clarify.   YOU SHOULD EXPECT: Some feelings of bloating in the abdomen. Passage of more gas than usual. Walking can help get rid of the air that was put into your GI tract during the procedure and reduce the bloating. If you had a lower endoscopy (such as a colonoscopy or flexible sigmoidoscopy) you may notice spotting of blood in your stool or on the toilet paper. Some abdominal soreness may be present for a day or two, also.  DIET: Your first meal following the procedure should be a light meal and then it is ok to progress to your normal diet. A half-sandwich or bowl of soup is an example of a good first meal. Heavy or fried foods are harder to digest and may make you feel nauseous or bloated. Drink plenty of fluids but you should avoid alcoholic beverages for 24 hours. If you had a esophageal dilation, please see attached instructions for diet.    ACTIVITY: Your care partner should take you home directly after the procedure. You should plan to take it easy, moving slowly for the rest of the day. You can resume normal activity the day after the procedure however YOU SHOULD NOT DRIVE, use power tools, machinery or perform tasks that involve climbing or major physical exertion for 24 hours (because of the sedation medicines used during the test).   SYMPTOMS TO REPORT IMMEDIATELY: A gastroenterologist can be reached at any hour. Please call  [phone]  for any of the following symptoms:  Following lower endoscopy (colonoscopy, flexible sigmoidoscopy) Excessive amounts of blood in the stool  Significant tenderness, worsening of abdominal pains  Swelling of the abdomen that is new, acute  Fever of 100 or higher    FOLLOW UP:  If any biopsies were taken you will be contacted by phone or by letter within the next 1-3 weeks. Call [phone]  if you have not heard about the biopsies in 3 weeks.  Please also call with any specific questions about appointments or follow up tests.

## 2018-06-05 NOTE — Op Note (Signed)
[place] Patient Name: Erik Holmes Procedure Date: 06/05/2018 9:46 AM MRN: [id number] Endoscopist: Gatha Mayer , MD Age: 57 Referring MD:  Date of Birth: 06/15/1961 Gender: Male Account #: [id number] Procedure:                Colonoscopy Indications:              Surveillance: Personal history of adenomatous                            polyps on last colonoscopy 5 years ago Medicines:                Propofol per Anesthesia, Monitored Anesthesia Care Procedure:                Pre-Anesthesia Assessment:                           - Prior to the procedure, a History and Physical                            was performed, and patient medications and                            allergies were reviewed. The patient's tolerance of                            previous anesthesia was also reviewed. The risks                            and benefits of the procedure and the sedation                            options and risks were discussed with the patient.                            All questions were answered, and informed consent                            was obtained. Prior Anticoagulants: The patient has                            taken no previous anticoagulant or antiplatelet                            agents. ASA Grade Assessment: II - A patient with                            mild systemic disease. After reviewing the risks                            and benefits, the patient was deemed in                            satisfactory condition to undergo the procedure.  After obtaining informed consent, the colonoscope                            was passed under direct vision. Throughout the                            procedure, the patient's blood pressure, pulse, and                            oxygen saturations were monitored continuously. The                            Colonoscope was introduced through the anus and   advanced to the the cecum, identified by                            appendiceal orifice and ileocecal valve. The                            colonoscopy was performed without difficulty. The                            patient tolerated the procedure well. The quality                            of the bowel preparation was adequate. The                            ileocecal valve, appendiceal orifice, and rectum                            were photographed. The bowel preparation used was                            Miralax. Scope In: 9:55:51 AM Scope Out: 10:15:48 AM Scope Withdrawal Time: 0 hours 17 minutes 20 seconds  Total Procedure Duration: 0 hours 19 minutes 57 seconds  Findings:                 The perianal and digital rectal examinations were                            normal. Pertinent negatives include normal prostate                            (size, shape, and consistency).                           Five sessile polyps were found in the transverse                            colon. The polyps were diminutive in size. These                            polyps were removed with a cold snare. Resection  and retrieval were complete. Verification of                            patient identification for the specimen was done.                            Estimated blood loss was minimal.                           Internal hemorrhoids were found during retroflexion.                           A small post hemorrhoid banding was found in the                            rectum.                           The exam was otherwise without abnormality on                            direct and retroflexion views. Complications:            No immediate complications. Estimated Blood Loss:     Estimated blood loss was minimal. Impression:               - Five diminutive polyps in the transverse colon,                            removed with a cold snare. Resected and retrieved.                            - Internal hemorrhoids.                           - The examination was otherwise normal on direct                            and retroflexion views.                           - Personal history of colonic polyps. Adenomas                            since 2012 and last in 2014 Recommendation:           - Patient has a contact number available for                            emergencies. The signs and symptoms of potential                            delayed complications were discussed with the                            patient. Return to normal activities tomorrow.  Written discharge instructions were provided to the                            patient.                           - Resume previous diet.                           - Continue present medications.                           - Repeat colonoscopy is recommended for                            surveillance. The colonoscopy date will be                            determined after pathology results from today's                            exam become available for review. Gatha Mayer, MD 06/05/2018 10:26:08 AM This report has been signed electronically.

## 2018-06-05 NOTE — Progress Notes (Signed)
Spontaneous respirations throughout. VSS. Resting comfortably. To PACU on room air. Report to  RN. 

## 2018-06-06 ENCOUNTER — Telehealth: Payer: Self-pay | Admitting: *Deleted

## 2018-06-06 NOTE — Telephone Encounter (Signed)
  Follow up Call-  Call back number 06/05/2018  Post procedure Call Back phone  # [phone]  Permission to leave phone message Yes  Some recent data might be hidden     Patient questions:  Do you have a fever, pain , or abdominal swelling? No. Pain Score  0 *  Have you tolerated food without any problems? Yes.    Have you been able to return to your normal activities? Yes.    Do you have any questions about your discharge instructions: Diet   No. Medications  No. Follow up visit  No.  Do you have questions or concerns about your Care? No.  Actions: * If pain score is 4 or above: No action needed, pain <4.

## 2018-06-13 ENCOUNTER — Encounter: Payer: Self-pay | Admitting: Internal Medicine

## 2018-06-13 DIAGNOSIS — Z86010 Personal history of colonic polyps: Secondary | ICD-10-CM

## 2018-06-13 NOTE — Progress Notes (Signed)
3 diminutive adenomas 2 lymphoid polyps Recall 2024

## 2018-06-14 DIAGNOSIS — F411 Generalized anxiety disorder: Secondary | ICD-10-CM | POA: Diagnosis not present

## 2018-06-14 DIAGNOSIS — E785 Hyperlipidemia, unspecified: Secondary | ICD-10-CM | POA: Diagnosis not present

## 2018-06-14 DIAGNOSIS — Z0000 Encounter for general adult medical examination without abnormal findings: Secondary | ICD-10-CM | POA: Diagnosis not present

## 2018-06-14 DIAGNOSIS — M25552 Pain in left hip: Secondary | ICD-10-CM | POA: Diagnosis not present

## 2018-06-14 DIAGNOSIS — N529 Male erectile dysfunction, unspecified: Secondary | ICD-10-CM | POA: Diagnosis not present

## 2018-06-14 DIAGNOSIS — F17200 Nicotine dependence, unspecified, uncomplicated: Secondary | ICD-10-CM | POA: Diagnosis not present

## 2018-06-14 DIAGNOSIS — I10 Essential (primary) hypertension: Secondary | ICD-10-CM | POA: Diagnosis not present

## 2018-06-14 DIAGNOSIS — K219 Gastro-esophageal reflux disease without esophagitis: Secondary | ICD-10-CM | POA: Diagnosis not present

## 2018-06-14 DIAGNOSIS — G4733 Obstructive sleep apnea (adult) (pediatric): Secondary | ICD-10-CM | POA: Diagnosis not present

## 2018-06-14 DIAGNOSIS — E119 Type 2 diabetes mellitus without complications: Secondary | ICD-10-CM | POA: Diagnosis not present

## 2018-06-14 DIAGNOSIS — F319 Bipolar disorder, unspecified: Secondary | ICD-10-CM | POA: Diagnosis not present

## 2018-06-26 DIAGNOSIS — F332 Major depressive disorder, recurrent severe without psychotic features: Secondary | ICD-10-CM | POA: Diagnosis not present

## 2018-09-09 ENCOUNTER — Encounter: Payer: Self-pay | Admitting: Emergency Medicine

## 2018-09-18 ENCOUNTER — Ambulatory Visit: Payer: Self-pay | Admitting: Psychiatry

## 2018-10-23 ENCOUNTER — Ambulatory Visit (INDEPENDENT_AMBULATORY_CARE_PROVIDER_SITE_OTHER): Payer: Medicare Other | Admitting: Psychiatry

## 2018-10-23 DIAGNOSIS — R413 Other amnesia: Secondary | ICD-10-CM

## 2018-10-23 DIAGNOSIS — F32A Depression, unspecified: Secondary | ICD-10-CM

## 2018-10-23 DIAGNOSIS — F411 Generalized anxiety disorder: Secondary | ICD-10-CM

## 2018-10-23 DIAGNOSIS — F329 Major depressive disorder, single episode, unspecified: Secondary | ICD-10-CM | POA: Diagnosis not present

## 2018-10-23 MED ORDER — VENLAFAXINE HCL ER 150 MG PO CP24: 300.0000 mg | ORAL_CAPSULE | Freq: Every day | ORAL | 1 refills | Status: DC

## 2018-10-23 MED ORDER — LITHIUM CARBONATE 300 MG PO CAPS: ORAL_CAPSULE | ORAL | 1 refills | Status: DC

## 2018-10-23 MED ORDER — BUSPIRONE HCL 5 MG PO TABS: 15.0000 mg | ORAL_TABLET | Freq: Two times a day (BID) | ORAL | Status: DC

## 2018-10-23 NOTE — Progress Notes (Signed)
[facility]  Patient ID: Erik Mcbreen.,  MRN: [id number]  PCP: Gaynelle Arabian, MD  Date of Evaluation: 10/23/2018 Time spent:20 minutes  Chief Complaint:   HISTORY/CURRENT STATUS: HPI last seen in August.  He was doing well at the time.  He continues to do well.  Diagnoses include depression, anxiety with panic attacks, and dementia (neurologist follows for that). Stressors include his mom with breast cancer with mets to the brain.  Individual Medical History/ Review of Systems: Changes? :No   Allergies: Penicillins; Codeine; Other; Sildenafil citrate; and Xanax [alprazolam]  Current Medications:  Current Outpatient Medications:  .  lithium carbonate 300 MG capsule, 1qam and 2qhs, Disp: 270 capsule, Rfl: 1 .  venlafaxine XR (EFFEXOR-XR) 150 MG 24 hr capsule, Take 2 capsules (300 mg total) by mouth daily with breakfast., Disp: 180 capsule, Rfl: 1 .  albuterol (PROVENTIL HFA;VENTOLIN HFA) 108 (90 Base) MCG/ACT inhaler, Inhale 2 puffs into the lungs every 6 (six) hours as needed for wheezing or shortness of breath., Disp: 1 Inhaler, Rfl: 0 .  aspirin EC 325 MG tablet, Take 1 tablet (325 mg total) by mouth 2 (two) times daily. (Patient not taking: Reported on 06/05/2018), Disp: 60 tablet, Rfl: 0 .  EPINEPHrine (EPIPEN 2-PAK) 0.3 mg/0.3 mL IJ SOAJ injection, Take 0.3 mg by mouth daily as needed for anaphylaxis., Disp: , Rfl:  .  lisinopril (PRINIVIL,ZESTRIL) 10 MG tablet, Take 10 mg by mouth daily., Disp: , Rfl: 0 .  NON FORMULARY, CPAP AT BEDTIME, Disp: , Rfl:  .  oxyCODONE (ROXICODONE) 5 MG immediate release tablet, Take 2-3 tablets (10-15 mg total) by mouth every 4 (four) hours as needed for severe pain or breakthrough pain. (Patient not taking: Reported on 02/13/2018), Disp: 60 tablet, Rfl: 0 .  polyethylene glycol powder (GLYCOLAX/MIRALAX) powder, Take 1 Container by mouth once., Disp: , Rfl:  .  tadalafil (CIALIS) 20 MG tablet, Take 10-20 mg by mouth daily as needed  for erectile dysfunction., Disp: , Rfl:   Current Facility-Administered Medications:  .  busPIRone (BUSPAR) tablet 15 mg, 15 mg, Oral, BID, Nylan Nakatani, PA-C Medication Side Effects: none  Family Medical/ Social History: Changes? no  MENTAL HEALTH EXAM:  There were no vitals taken for this visit.There is no height or weight on file to calculate BMI.  General Appearance: Casual  Eye Contact:  Good  Speech:  Clear and Coherent  Volume:  Normal  Mood:  Euthymic  Affect:  Appropriate  Thought Process:  Linear  Orientation:  Full (Time, Place, and Person)  Thought Content: Logical   Suicidal Thoughts:  No  Homicidal Thoughts:  No  Memory:  WNL  Judgement:  Good  Insight:  Good  Psychomotor Activity:  Normal  Concentration:  Concentration: Good  Recall:  Good  Fund of Knowledge: Good  Language: Good  Assets:  Desire for Improvement  ADL's:  Intact  Cognition: WNL  Prognosis:  Good    DIAGNOSES:    ICD-10-CM   1. Depression, unspecified depression type F32.9 Lithium level    Iron, TIBC and Ferritin Panel  2. Anxiety state F41.1   3. Memory loss R41.3     Receiving Psychotherapy: No    RECOMMENDATIONS: We will continue the same medications BuSpar 30 mg twice daily, Effexor xr lithium 300 mg 150 twice a day, p.m. 300 mg 1 in the morning and 2 at night.  He is also to get a lithium level, labs for restless leg which include ferritin iron studies  CBC.  Recheck in 3 months.   Comer Locket, PA-C

## 2018-12-24 DIAGNOSIS — F17200 Nicotine dependence, unspecified, uncomplicated: Secondary | ICD-10-CM | POA: Diagnosis not present

## 2018-12-24 DIAGNOSIS — I10 Essential (primary) hypertension: Secondary | ICD-10-CM | POA: Diagnosis not present

## 2018-12-24 DIAGNOSIS — N529 Male erectile dysfunction, unspecified: Secondary | ICD-10-CM | POA: Diagnosis not present

## 2018-12-24 DIAGNOSIS — E785 Hyperlipidemia, unspecified: Secondary | ICD-10-CM | POA: Diagnosis not present

## 2018-12-24 DIAGNOSIS — Z86010 Personal history of colonic polyps: Secondary | ICD-10-CM | POA: Diagnosis not present

## 2018-12-24 DIAGNOSIS — F319 Bipolar disorder, unspecified: Secondary | ICD-10-CM | POA: Diagnosis not present

## 2018-12-24 DIAGNOSIS — G4733 Obstructive sleep apnea (adult) (pediatric): Secondary | ICD-10-CM | POA: Diagnosis not present

## 2018-12-24 DIAGNOSIS — F411 Generalized anxiety disorder: Secondary | ICD-10-CM | POA: Diagnosis not present

## 2018-12-24 DIAGNOSIS — K219 Gastro-esophageal reflux disease without esophagitis: Secondary | ICD-10-CM | POA: Diagnosis not present

## 2018-12-24 DIAGNOSIS — G2581 Restless legs syndrome: Secondary | ICD-10-CM | POA: Diagnosis not present

## 2018-12-24 DIAGNOSIS — E1169 Type 2 diabetes mellitus with other specified complication: Secondary | ICD-10-CM | POA: Diagnosis not present

## 2018-12-27 DIAGNOSIS — E119 Type 2 diabetes mellitus without complications: Secondary | ICD-10-CM | POA: Diagnosis not present

## 2018-12-27 DIAGNOSIS — F17210 Nicotine dependence, cigarettes, uncomplicated: Secondary | ICD-10-CM | POA: Diagnosis not present

## 2018-12-27 DIAGNOSIS — R52 Pain, unspecified: Secondary | ICD-10-CM | POA: Diagnosis not present

## 2018-12-27 DIAGNOSIS — R05 Cough: Secondary | ICD-10-CM | POA: Diagnosis not present

## 2018-12-27 DIAGNOSIS — J189 Pneumonia, unspecified organism: Secondary | ICD-10-CM | POA: Diagnosis not present

## 2019-01-22 ENCOUNTER — Ambulatory Visit (INDEPENDENT_AMBULATORY_CARE_PROVIDER_SITE_OTHER): Payer: Medicare Other | Admitting: Psychiatry

## 2019-01-22 DIAGNOSIS — F32A Depression, unspecified: Secondary | ICD-10-CM

## 2019-01-22 DIAGNOSIS — F329 Major depressive disorder, single episode, unspecified: Secondary | ICD-10-CM | POA: Diagnosis not present

## 2019-01-22 DIAGNOSIS — F411 Generalized anxiety disorder: Secondary | ICD-10-CM

## 2019-01-22 MED ORDER — BUSPIRONE HCL 15 MG PO TABS: ORAL_TABLET | ORAL | 1 refills | Status: DC

## 2019-01-22 MED ORDER — VENLAFAXINE HCL ER 150 MG PO CP24: 300.0000 mg | ORAL_CAPSULE | Freq: Every day | ORAL | 1 refills | Status: DC

## 2019-01-22 MED ORDER — LITHIUM CARBONATE 300 MG PO CAPS: ORAL_CAPSULE | ORAL | 1 refills | Status: DC

## 2019-01-22 NOTE — Progress Notes (Signed)
[facility]  Patient ID: Erik Holmes.,  MRN: [id number]  PCP: Gaynelle Arabian, MD  Date of Evaluation: 01/22/2019 Time spent:20 minutes  Chief Complaint:   HISTORY/CURRENT STATUS: HPI patient seen last 10/23/18 and was doing well.  Several stressors in his mom has breast cancer with metastases.  We continue the patient on his same medications. Continues to do well. Individual Medical History/ Review of Systems: Changes? :No   Allergies: Penicillins; Codeine; Other; Sildenafil citrate; and Xanax [alprazolam]  Current Medications:  Current Outpatient Medications:  .  albuterol (PROVENTIL HFA;VENTOLIN HFA) 108 (90 Base) MCG/ACT inhaler, Inhale 2 puffs into the lungs every 6 (six) hours as needed for wheezing or shortness of breath., Disp: 1 Inhaler, Rfl: 0 .  aspirin EC 325 MG tablet, Take 1 tablet (325 mg total) by mouth 2 (two) times daily. (Patient not taking: Reported on 06/05/2018), Disp: 60 tablet, Rfl: 0 .  EPINEPHrine (EPIPEN 2-PAK) 0.3 mg/0.3 mL IJ SOAJ injection, Take 0.3 mg by mouth daily as needed for anaphylaxis., Disp: , Rfl:  .  lisinopril (PRINIVIL,ZESTRIL) 10 MG tablet, Take 10 mg by mouth daily., Disp: , Rfl: 0 .  lithium carbonate 300 MG capsule, 1qam and 2qhs, Disp: 270 capsule, Rfl: 1 .  NON FORMULARY, CPAP AT BEDTIME, Disp: , Rfl:  .  oxyCODONE (ROXICODONE) 5 MG immediate release tablet, Take 2-3 tablets (10-15 mg total) by mouth every 4 (four) hours as needed for severe pain or breakthrough pain. (Patient not taking: Reported on 02/13/2018), Disp: 60 tablet, Rfl: 0 .  polyethylene glycol powder (GLYCOLAX/MIRALAX) powder, Take 1 Container by mouth once., Disp: , Rfl:  .  tadalafil (CIALIS) 20 MG tablet, Take 10-20 mg by mouth daily as needed for erectile dysfunction., Disp: , Rfl:  .  venlafaxine XR (EFFEXOR-XR) 150 MG 24 hr capsule, Take 2 capsules (300 mg total) by mouth daily with breakfast., Disp: 180 capsule, Rfl: 1  Current  Facility-Administered Medications:  .  busPIRone (BUSPAR) tablet 15 mg, 15 mg, Oral, BID, Acacia Latorre, PA-C Medication Side Effects: none  Family Medical/ Social History: Changes? No  MENTAL HEALTH EXAM:  There were no vitals taken for this visit.There is no height or weight on file to calculate BMI.  General Appearance: Casual  Eye Contact:  Good  Speech:  Clear and Coherent  Volume:  Normal  Mood:  Euthymic  Affect:  Appropriate  Thought Process:  Linear  Orientation:  Full (Time, Place, and Person)  Thought Content: Logical   Suicidal Thoughts:  No  Homicidal Thoughts:  No  Memory:  WNL  Judgement:  Good  Insight:  Good  Psychomotor Activity:  Normal  Concentration:  Concentration: Good  Recall:  Good  Fund of Knowledge: Good  Language: Good  Assets:  Desire for Improvement  ADL's:  Intact  Cognition: WNL  Prognosis:  Good    DIAGNOSES: No diagnosis found.  Receiving Psychotherapy: No    RECOMMENDATIONS: Patient wants to see his same medications.  BuSpar 15 mg twice daily.  Effexor XR 152 a day.  Lithium 300 Monday morning and 2 in the evening.  He also will go ahead and get his lithium level and his restless leg syndrome labs return in 4 months call if need to be seen sooner   [employer], PA-C

## 2019-03-25 DIAGNOSIS — E1169 Type 2 diabetes mellitus with other specified complication: Secondary | ICD-10-CM | POA: Diagnosis not present

## 2019-05-29 ENCOUNTER — Ambulatory Visit: Payer: Medicare Other | Admitting: Psychiatry

## 2019-06-05 ENCOUNTER — Other Ambulatory Visit: Payer: Self-pay

## 2019-06-05 ENCOUNTER — Ambulatory Visit (INDEPENDENT_AMBULATORY_CARE_PROVIDER_SITE_OTHER): Payer: Medicare Other | Admitting: Psychiatry

## 2019-06-05 ENCOUNTER — Encounter: Payer: Self-pay | Admitting: Psychiatry

## 2019-06-05 DIAGNOSIS — Z79899 Other long term (current) drug therapy: Secondary | ICD-10-CM | POA: Diagnosis not present

## 2019-06-05 DIAGNOSIS — F411 Generalized anxiety disorder: Secondary | ICD-10-CM | POA: Diagnosis not present

## 2019-06-05 DIAGNOSIS — F32A Depression, unspecified: Secondary | ICD-10-CM

## 2019-06-05 DIAGNOSIS — F329 Major depressive disorder, single episode, unspecified: Secondary | ICD-10-CM

## 2019-06-05 MED ORDER — VENLAFAXINE HCL ER 150 MG PO CP24: 300.0000 mg | ORAL_CAPSULE | Freq: Every day | ORAL | 1 refills | Status: DC

## 2019-06-05 MED ORDER — BUSPIRONE HCL 15 MG PO TABS: ORAL_TABLET | ORAL | 1 refills | Status: DC

## 2019-06-05 MED ORDER — LITHIUM CARBONATE 300 MG PO CAPS: ORAL_CAPSULE | ORAL | 1 refills | Status: DC

## 2019-06-05 NOTE — Progress Notes (Signed)
Erik Holmes [id number] [birth year] 58 y.o.  Subjective:   Patient ID:  Erik Holmes. is a 58 y.o. (DOB 1961/02/03) male.  Chief Complaint:  Chief Complaint  Patient presents with  . Follow-up    h/o Depression, anxiety    HPI Erik Holmes. presents to the office today for follow-up of depression and anxiety. He was previously seen by Comer Locket, PA. "I'm doing the best I have done in a long, long time." He reports that he has not had any recent depression. Reports that his mood has been stable. Denies anxiety. Sleeping well and estimates sleeping 8 hours a night. He reports that he awakens every morning at 4-5 am and goes to bed when it gets dark. Appetite has been good. He reports some recent weight loss. Energy and motivation are ok and reports that he has doing some projects around the house. He reports that he has some concentration difficulties and reports that he does not pay bills or reading. Denies current or past SI.  He reports that his dog is a good companion and source of support. Mother is in nursing home.   Review of Systems:  Review of Systems  Gastrointestinal:       Occ diarrhea  Musculoskeletal: Positive for arthralgias. Negative for gait problem.  Neurological: Negative for tremors.       He reports h/o multiple head injuries and past LOC.  Psychiatric/Behavioral:       Please refer to HPI   Reports that PCP started tx for elevated cholesterol.   Medications: I have reviewed the patient's current medications.  Current Outpatient Medications  Medication Sig Dispense Refill  . albuterol (PROVENTIL HFA;VENTOLIN HFA) 108 (90 Base) MCG/ACT inhaler Inhale 2 puffs into the lungs every 6 (six) hours as needed for wheezing or shortness of breath. 1 Inhaler 0  . busPIRone (BUSPAR) 15 MG tablet 1 bid 180 tablet 1  . lisinopril (PRINIVIL,ZESTRIL) 10 MG tablet Take 10 mg by mouth daily.  0  . lithium carbonate 300 MG capsule 1qam and 2qhs 270  capsule 1  . metFORMIN (GLUCOPHAGE-XR) 500 MG 24 hr tablet Take 1,000 mg by mouth 2 (two) times a day.     . rosuvastatin (CRESTOR) 5 MG tablet TK 1 T PO QD FOR CHOLESTEROL    . tadalafil (CIALIS) 20 MG tablet Take 10-20 mg by mouth daily as needed for erectile dysfunction.    Marland Kitchen venlafaxine XR (EFFEXOR-XR) 150 MG 24 hr capsule Take 2 capsules (300 mg total) by mouth daily with breakfast. 180 capsule 1  . aspirin EC 325 MG tablet Take 1 tablet (325 mg total) by mouth 2 (two) times daily. (Patient not taking: Reported on 06/05/2018) 60 tablet 0  . EPINEPHrine (EPIPEN 2-PAK) 0.3 mg/0.3 mL IJ SOAJ injection Take 0.3 mg by mouth daily as needed for anaphylaxis.    . NON FORMULARY CPAP AT BEDTIME    . oxyCODONE (ROXICODONE) 5 MG immediate release tablet Take 2-3 tablets (10-15 mg total) by mouth every 4 (four) hours as needed for severe pain or breakthrough pain. (Patient not taking: Reported on 02/13/2018) 60 tablet 0  . polyethylene glycol powder (GLYCOLAX/MIRALAX) powder Take 1 Container by mouth once.     No current facility-administered medications for this visit.     Medication Side Effects: None  Allergies:  Allergies  Allergen Reactions  . Penicillins Anaphylaxis and Other (See Comments)    Has patient had a PCN reaction causing immediate rash, facial/tongue/throat  swelling, SOB or lightheadedness with hypotension:Yes Has patient had a PCN reaction causing severe rash involving mucus membranes or skin necrosis:No Has patient had a PCN reaction that required hospitalization:Treated in ER for reaction Has patient had a PCN reaction occurring within the last 10 years:Yes If all of the above answers are "NO", then may proceed with Cephalosporin use.   . Codeine Other (See Comments)    Made pt feel like skin was crawling  . Other Other (See Comments)    "bee stings"  . Sildenafil Citrate Other (See Comments)  . Xanax [Alprazolam]     Made him feel angry    Past Medical History:   Diagnosis Date  . Allergy   . Anxiety   . Cataract   . Degenerative disc disease, lumbar   . Depression   . Diabetes mellitus without complication ([place])   . ED (erectile dysfunction)   . GERD (gastroesophageal reflux disease)   . Gout   . Hypertension   . Internal hemorrhoids with prolapse, bleeding 04/24/2014  . RLS (restless legs syndrome)   . Seizures ([place])    last seizure around 2016 per patient.  . Shortness of breath   . Sleep apnea    cpap  . Tubular adenoma of colon    multiple, TV adenomas also    Family History  Problem Relation Age of Onset  . Cancer Mother   . Breast cancer Mother   . Osteoporosis Mother   . Heart disease Father   . Colon cancer Neg Hx   . Esophageal cancer Neg Hx   . Liver cancer Neg Hx   . Pancreatic cancer Neg Hx   . Stomach cancer Neg Hx   . Rectal cancer Neg Hx     Social History   Socioeconomic History  . Marital status: Married    Spouse name: [name]  . Number of children: 5  . Years of education: 12th  . Highest education level: Not on file  Occupational History    Employer: UNEMPLOYED  Social Needs  . Financial resource strain: Not on file  . Food insecurity    Worry: Not on file    Inability: Not on file  . Transportation needs    Medical: Not on file    Non-medical: Not on file  Tobacco Use  . Smoking status: Current Every Day Smoker    Packs/day: 2.00    Years: 40.00    Pack years: 80.00    Types: Cigarettes    Last attempt to quit: 11/18/2013    Years since quitting: 5.5  . Smokeless tobacco: Never Used  Substance and Sexual Activity  . Alcohol use: No  . Drug use: Yes    Types: Marijuana  . Sexual activity: Not on file  Lifestyle  . Physical activity    Days per week: Not on file    Minutes per session: Not on file  . Stress: Not on file  Relationships  . Social Herbalist on phone: Not on file    Gets together: Not on file    Attends religious service: Not on file    Active member of club  or organization: Not on file    Attends meetings of clubs or organizations: Not on file    Relationship status: Not on file  . Intimate partner violence    Fear of current or ex partner: Not on file    Emotionally abused: Not on file    Physically abused: Not  on file    Forced sexual activity: Not on file  Other Topics Concern  . Not on file  Social History Narrative   Married, 5 grown children   Retired/disabled Dealer   Patient lives at home with his spouse.   Caffeine use: 2 cups daily    Past Medical History, Surgical history, Social history, and Family history were reviewed and updated as appropriate.   Please see review of systems for further details on the patient's review from today.   Objective:   Physical Exam:  There were no vitals taken for this visit.  Physical Exam Constitutional:      General: He is not in acute distress.    Appearance: He is well-developed.  Musculoskeletal:        General: No deformity.  Neurological:     Mental Status: He is alert and oriented to person, place, and time.     Coordination: Coordination normal.  Psychiatric:        Attention and Perception: Attention and perception normal. He does not perceive auditory or visual hallucinations.        Mood and Affect: Mood normal. Mood is not anxious or depressed. Affect is not labile, blunt, angry or inappropriate.        Speech: Speech normal.        Behavior: Behavior normal.        Thought Content: Thought content normal. Thought content does not include homicidal or suicidal ideation. Thought content does not include homicidal or suicidal plan.        Cognition and Memory: Cognition and memory normal.        Judgment: Judgment normal.     Comments: Insight intact. No delusions.      Lab Review:     Component Value Date/Time   NA 137 02/05/2017 0355   K 4.4 02/05/2017 0355   CL 106 02/05/2017 0355   CO2 27 02/05/2017 0355   GLUCOSE 209 (H) 02/05/2017 0355   BUN 15  02/05/2017 0355   CREATININE 0.85 02/05/2017 0355   CALCIUM 8.7 (L) 02/05/2017 0355   PROT 6.8 01/26/2012 0915   ALBUMIN 3.7 01/26/2012 0915   AST 17 01/26/2012 0915   ALT 21 01/26/2012 0915   ALKPHOS 54 01/26/2012 0915   BILITOT 0.3 01/26/2012 0915   GFRNONAA >60 02/05/2017 0355   GFRAA >60 02/05/2017 0355       Component Value Date/Time   WBC 17.3 (H) 02/05/2017 0355   RBC 4.75 02/05/2017 0355   HGB 13.6 02/05/2017 0355   HCT 41.7 02/05/2017 0355   PLT 263 02/05/2017 0355   MCV 87.8 02/05/2017 0355   MCH 28.6 02/05/2017 0355   MCHC 32.6 02/05/2017 0355   RDW 13.6 02/05/2017 0355   LYMPHSABS 2.7 01/26/2012 0915   MONOABS 0.6 01/26/2012 0915   EOSABS 0.1 01/26/2012 0915   BASOSABS 0.0 01/26/2012 0915    No results found for: POCLITH, LITHIUM   No results found for: PHENYTOIN, PHENOBARB, VALPROATE, CBMZ   .res Assessment: Plan:   Discussed lab monitoring to evaluate for potential adverse effects with lithium to include potential risk for renal issues or thyroid disorder.  Will request lab results from PCP to monitor creatinine and TSH.  Will order lithium level to determine current drug level of lithium. Will continue current medications without changes since patient reports that his mood and anxiety are well controlled without any tolerability issues. Patient to follow-up in 4 months or sooner if clinically indicated. Patient advised  to contact office with any questions, adverse effects, or acute worsening in signs and symptoms.  Larson was seen today for follow-up.  Diagnoses and all orders for this visit:  Depression, unspecified depression type -     lithium carbonate 300 MG capsule; 1qam and 2qhs -     venlafaxine XR (EFFEXOR-XR) 150 MG 24 hr capsule; Take 2 capsules (300 mg total) by mouth daily with breakfast.  Anxiety state -     busPIRone (BUSPAR) 15 MG tablet; 1 bid -     venlafaxine XR (EFFEXOR-XR) 150 MG 24 hr capsule; Take 2 capsules (300 mg total) by  mouth daily with breakfast.  High risk medication use -     Lithium level     Please see After Visit Summary for patient specific instructions.  Future Appointments  Date Time Provider [place]  10/04/2019  9:00 AM Thayer Headings, PMHNP CP-CP None    Orders Placed This Encounter  Procedures  . Lithium level    -------------------------------

## 2019-06-11 DIAGNOSIS — H1132 Conjunctival hemorrhage, left eye: Secondary | ICD-10-CM | POA: Diagnosis not present

## 2019-06-17 DIAGNOSIS — G2581 Restless legs syndrome: Secondary | ICD-10-CM | POA: Diagnosis not present

## 2019-06-17 DIAGNOSIS — G4733 Obstructive sleep apnea (adult) (pediatric): Secondary | ICD-10-CM | POA: Diagnosis not present

## 2019-07-10 ENCOUNTER — Other Ambulatory Visit: Payer: Self-pay | Admitting: Family Medicine

## 2019-07-10 DIAGNOSIS — N529 Male erectile dysfunction, unspecified: Secondary | ICD-10-CM | POA: Diagnosis not present

## 2019-07-10 DIAGNOSIS — Z1389 Encounter for screening for other disorder: Secondary | ICD-10-CM | POA: Diagnosis not present

## 2019-07-10 DIAGNOSIS — E785 Hyperlipidemia, unspecified: Secondary | ICD-10-CM | POA: Diagnosis not present

## 2019-07-10 DIAGNOSIS — Z86010 Personal history of colonic polyps: Secondary | ICD-10-CM | POA: Diagnosis not present

## 2019-07-10 DIAGNOSIS — G2581 Restless legs syndrome: Secondary | ICD-10-CM | POA: Diagnosis not present

## 2019-07-10 DIAGNOSIS — K219 Gastro-esophageal reflux disease without esophagitis: Secondary | ICD-10-CM | POA: Diagnosis not present

## 2019-07-10 DIAGNOSIS — G4733 Obstructive sleep apnea (adult) (pediatric): Secondary | ICD-10-CM | POA: Diagnosis not present

## 2019-07-10 DIAGNOSIS — E1169 Type 2 diabetes mellitus with other specified complication: Secondary | ICD-10-CM | POA: Diagnosis not present

## 2019-07-10 DIAGNOSIS — N5089 Other specified disorders of the male genital organs: Secondary | ICD-10-CM

## 2019-07-10 DIAGNOSIS — Z0000 Encounter for general adult medical examination without abnormal findings: Secondary | ICD-10-CM | POA: Diagnosis not present

## 2019-07-10 DIAGNOSIS — F411 Generalized anxiety disorder: Secondary | ICD-10-CM | POA: Diagnosis not present

## 2019-07-10 DIAGNOSIS — I10 Essential (primary) hypertension: Secondary | ICD-10-CM | POA: Diagnosis not present

## 2019-07-10 DIAGNOSIS — Z1159 Encounter for screening for other viral diseases: Secondary | ICD-10-CM | POA: Diagnosis not present

## 2019-07-15 ENCOUNTER — Ambulatory Visit
Admission: RE | Admit: 2019-07-15 | Discharge: 2019-07-15 | Disposition: A | Discharge status: Home / Self Care | Payer: Medicare Other | Source: Ambulatory Visit | Attending: Family Medicine | Admitting: Family Medicine

## 2019-07-15 DIAGNOSIS — N5089 Other specified disorders of the male genital organs: Secondary | ICD-10-CM

## 2019-07-15 DIAGNOSIS — N503 Cyst of epididymis: Secondary | ICD-10-CM | POA: Diagnosis not present

## 2019-07-17 DIAGNOSIS — F319 Bipolar disorder, unspecified: Secondary | ICD-10-CM | POA: Diagnosis not present

## 2019-07-17 DIAGNOSIS — E785 Hyperlipidemia, unspecified: Secondary | ICD-10-CM | POA: Diagnosis not present

## 2019-07-17 DIAGNOSIS — I10 Essential (primary) hypertension: Secondary | ICD-10-CM | POA: Diagnosis not present

## 2019-07-17 DIAGNOSIS — Z125 Encounter for screening for malignant neoplasm of prostate: Secondary | ICD-10-CM | POA: Diagnosis not present

## 2019-07-17 DIAGNOSIS — Z79899 Other long term (current) drug therapy: Secondary | ICD-10-CM | POA: Diagnosis not present

## 2019-07-17 DIAGNOSIS — Z1159 Encounter for screening for other viral diseases: Secondary | ICD-10-CM | POA: Diagnosis not present

## 2019-07-17 DIAGNOSIS — E1169 Type 2 diabetes mellitus with other specified complication: Secondary | ICD-10-CM | POA: Diagnosis not present

## 2019-07-17 DIAGNOSIS — H35033 Hypertensive retinopathy, bilateral: Secondary | ICD-10-CM | POA: Diagnosis not present

## 2019-07-17 DIAGNOSIS — H2640 Unspecified secondary cataract: Secondary | ICD-10-CM | POA: Diagnosis not present

## 2019-07-17 DIAGNOSIS — E119 Type 2 diabetes mellitus without complications: Secondary | ICD-10-CM | POA: Diagnosis not present

## 2019-07-17 DIAGNOSIS — Z961 Presence of intraocular lens: Secondary | ICD-10-CM | POA: Diagnosis not present

## 2019-10-04 ENCOUNTER — Ambulatory Visit: Payer: Medicare Other | Admitting: Psychiatry

## 2019-10-11 ENCOUNTER — Ambulatory Visit (INDEPENDENT_AMBULATORY_CARE_PROVIDER_SITE_OTHER): Payer: Medicare Other | Admitting: Psychiatry

## 2019-10-11 ENCOUNTER — Encounter: Payer: Self-pay | Admitting: Psychiatry

## 2019-10-11 ENCOUNTER — Other Ambulatory Visit: Payer: Self-pay

## 2019-10-11 DIAGNOSIS — F329 Major depressive disorder, single episode, unspecified: Secondary | ICD-10-CM | POA: Diagnosis not present

## 2019-10-11 DIAGNOSIS — F411 Generalized anxiety disorder: Secondary | ICD-10-CM | POA: Diagnosis not present

## 2019-10-11 DIAGNOSIS — F32A Depression, unspecified: Secondary | ICD-10-CM

## 2019-10-11 MED ORDER — BUSPIRONE HCL 15 MG PO TABS: ORAL_TABLET | ORAL | 1 refills | Status: DC

## 2019-10-11 MED ORDER — VENLAFAXINE HCL ER 150 MG PO CP24: 300.0000 mg | ORAL_CAPSULE | Freq: Every day | ORAL | 1 refills | Status: DC

## 2019-10-11 MED ORDER — LITHIUM CARBONATE 300 MG PO CAPS: ORAL_CAPSULE | ORAL | 1 refills | Status: DC

## 2019-10-11 NOTE — Progress Notes (Signed)
Lydia Krumrey [id number] 03/06/1961 58 y.o.  Subjective:   Patient ID:  Erik Holmes. is a 58 y.o. (DOB 03-22-1961) male.  Chief Complaint:  Chief Complaint  Patient presents with  . Follow-up    Anxiety and mood disturbance    HPI Chelsey Alix. presents to the office today for follow-up of mood and anxiety. He reports that his mood has been stable. Denies depressed or irritable moods. Denies any recent severe anxiety. He reports occ mild anxiety and then attempts to redirect these thoughts. Has had 3 recent deaths to include aunt dying of cancer, nephew overdosed, a first cousin died of COVID complications. He reports that he is trying to remain positive and focus on what he can do by praying for them and showing his respects for the family. He reports that he reports he is occ slow to get motivated at times. Energy has been adequate. Wakes up at 3 am. Goes to sleep by 8:30 pm or earlier. He reports that he has intentionally been losing weight. Concentration has been adequate. Denies impulsive or risky behavior. Denies SI.  He plans to start breeding puppies. Has been restoring a car.   Review of Systems:  Review of Systems  Musculoskeletal: Positive for back pain. Negative for gait problem.  Neurological: Negative for tremors.  Psychiatric/Behavioral:       Please refer to HPI    Medications: I have reviewed the patient's current medications.  Current Outpatient Medications  Medication Sig Dispense Refill  . albuterol (PROVENTIL HFA;VENTOLIN HFA) 108 (90 Base) MCG/ACT inhaler Inhale 2 puffs into the lungs every 6 (six) hours as needed for wheezing or shortness of breath. 1 Inhaler 0  . busPIRone (BUSPAR) 15 MG tablet 1 bid 180 tablet 1  . lisinopril (PRINIVIL,ZESTRIL) 10 MG tablet Take 10 mg by mouth daily.  0  . lithium carbonate 300 MG capsule 1qam and 2qhs 270 capsule 1  . metFORMIN (GLUCOPHAGE-XR) 500 MG 24 hr tablet Take 1,000 mg by mouth 2 (two) times  a day.     . NON FORMULARY CPAP AT BEDTIME    . polyethylene glycol powder (GLYCOLAX/MIRALAX) powder Take 1 Container by mouth once.    . rosuvastatin (CRESTOR) 5 MG tablet TK 1 T PO QD FOR CHOLESTEROL    . tadalafil (CIALIS) 20 MG tablet Take 10-20 mg by mouth daily as needed for erectile dysfunction.    Marland Kitchen venlafaxine XR (EFFEXOR-XR) 150 MG 24 hr capsule Take 2 capsules (300 mg total) by mouth daily with breakfast. 180 capsule 1  . aspirin EC 325 MG tablet Take 1 tablet (325 mg total) by mouth 2 (two) times daily. (Patient not taking: Reported on 06/05/2018) 60 tablet 0  . EPINEPHrine (EPIPEN 2-PAK) 0.3 mg/0.3 mL IJ SOAJ injection Take 0.3 mg by mouth daily as needed for anaphylaxis.    Marland Kitchen oxyCODONE (ROXICODONE) 5 MG immediate release tablet Take 2-3 tablets (10-15 mg total) by mouth every 4 (four) hours as needed for severe pain or breakthrough pain. (Patient not taking: Reported on 02/13/2018) 60 tablet 0   No current facility-administered medications for this visit.     Medication Side Effects: None  Allergies:  Allergies  Allergen Reactions  . Penicillins Anaphylaxis and Other (See Comments)    Has patient had a PCN reaction causing immediate rash, facial/tongue/throat swelling, SOB or lightheadedness with hypotension:Yes Has patient had a PCN reaction causing severe rash involving mucus membranes or skin necrosis:No Has patient had a PCN  reaction that required hospitalization:Treated in ER for reaction Has patient had a PCN reaction occurring within the last 10 years:Yes If all of the above answers are "NO", then may proceed with Cephalosporin use.   . Codeine Other (See Comments)    Made pt feel like skin was crawling  . Other Other (See Comments)    "bee stings"  . Sildenafil Citrate Other (See Comments)  . Xanax [Alprazolam]     Made him feel angry    Past Medical History:  Diagnosis Date  . Allergy   . Anxiety   . Cataract   . Degenerative disc disease, lumbar   .  Depression   . Diabetes mellitus without complication ([place])   . ED (erectile dysfunction)   . GERD (gastroesophageal reflux disease)   . Gout   . Hypertension   . Internal hemorrhoids with prolapse, bleeding 04/24/2014  . RLS (restless legs syndrome)   . Seizures ([place])    last seizure around 2016 per patient.  . Shortness of breath   . Sleep apnea    cpap  . Tubular adenoma of colon    multiple, TV adenomas also    Family History  Problem Relation Age of Onset  . Cancer Mother   . Breast cancer Mother   . Osteoporosis Mother   . Heart disease Father   . Colon cancer Neg Hx   . Esophageal cancer Neg Hx   . Liver cancer Neg Hx   . Pancreatic cancer Neg Hx   . Stomach cancer Neg Hx   . Rectal cancer Neg Hx     Social History   Socioeconomic History  . Marital status: Married    Spouse name: [name]  . Number of children: 5  . Years of education: 12th  . Highest education level: Not on file  Occupational History    Employer: UNEMPLOYED  Social Needs  . Financial resource strain: Not on file  . Food insecurity    Worry: Not on file    Inability: Not on file  . Transportation needs    Medical: Not on file    Non-medical: Not on file  Tobacco Use  . Smoking status: Current Every Day Smoker    Packs/day: 2.00    Years: 40.00    Pack years: 80.00    Types: Cigarettes    Last attempt to quit: 11/18/2013    Years since quitting: 5.9  . Smokeless tobacco: Never Used  Substance and Sexual Activity  . Alcohol use: No  . Drug use: Yes    Types: Marijuana  . Sexual activity: Not on file  Lifestyle  . Physical activity    Days per week: Not on file    Minutes per session: Not on file  . Stress: Not on file  Relationships  . Social Herbalist on phone: Not on file    Gets together: Not on file    Attends religious service: Not on file    Active member of club or organization: Not on file    Attends meetings of clubs or organizations: Not on file     Relationship status: Not on file  . Intimate partner violence    Fear of current or ex partner: Not on file    Emotionally abused: Not on file    Physically abused: Not on file    Forced sexual activity: Not on file  Other Topics Concern  . 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   Caffeine use: 2 cups daily    Past Medical History, Surgical history, Social history, and Family history were reviewed and updated as appropriate.   Please see review of systems for further details on the patient's review from today.   Objective:   Physical Exam:  BP 116/63   Pulse 75   Wt 225 lb (102.1 kg)   BMI 33.23 kg/m   Physical Exam Constitutional:      General: He is not in acute distress.    Appearance: He is well-developed.  Musculoskeletal:        General: No deformity.  Neurological:     Mental Status: He is alert and oriented to person, place, and time.     Coordination: Coordination normal.  Psychiatric:        Attention and Perception: Attention and perception normal. He does not perceive auditory or visual hallucinations.        Mood and Affect: Mood normal. Mood is not anxious or depressed. Affect is not labile, blunt, angry or inappropriate.        Speech: Speech normal.        Behavior: Behavior normal.        Thought Content: Thought content normal. Thought content does not include homicidal or suicidal ideation. Thought content does not include homicidal or suicidal plan.        Cognition and Memory: Cognition and memory normal.        Judgment: Judgment normal.     Comments: Insight intact. No delusions.      Lab Review:     Component Value Date/Time   NA 137 02/05/2017 0355   K 4.4 02/05/2017 0355   CL 106 02/05/2017 0355   CO2 27 02/05/2017 0355   GLUCOSE 209 (H) 02/05/2017 0355   BUN 15 02/05/2017 0355   CREATININE 0.85 02/05/2017 0355   CALCIUM 8.7 (L)  02/05/2017 0355   PROT 6.8 01/26/2012 0915   ALBUMIN 3.7 01/26/2012 0915   AST 17 01/26/2012 0915   ALT 21 01/26/2012 0915   ALKPHOS 54 01/26/2012 0915   BILITOT 0.3 01/26/2012 0915   GFRNONAA >60 02/05/2017 0355   GFRAA >60 02/05/2017 0355       Component Value Date/Time   WBC 17.3 (H) 02/05/2017 0355   RBC 4.75 02/05/2017 0355   HGB 13.6 02/05/2017 0355   HCT 41.7 02/05/2017 0355   PLT 263 02/05/2017 0355   MCV 87.8 02/05/2017 0355   MCH 28.6 02/05/2017 0355   MCHC 32.6 02/05/2017 0355   RDW 13.6 02/05/2017 0355   LYMPHSABS 2.7 01/26/2012 0915   MONOABS 0.6 01/26/2012 0915   EOSABS 0.1 01/26/2012 0915   BASOSABS 0.0 01/26/2012 0915    No results found for: POCLITH, LITHIUM   No results found for: PHENYTOIN, PHENOBARB, VALPROATE, CBMZ   .res Assessment: Plan:   Reviewed labs with pt and discussed that CMP, CBC, Lithium level, and lipid panel were WNL and not indicative of any adverse effects.  Will continue current plan of care since target signs and symptoms are well controlled without any tolerability issues. Continue Buspar 15 mg po BID for anxiety. Continue Lithium 300 mg po q am and 600 mg po QHS for mood stabilization. Continue Effexor XR 300 mg po qd for depression and anxiety.  Pt to f/u in 4 months or sooner if clinically indicated.  Patient advised to contact office  with any questions, adverse effects, or acute worsening in signs and symptoms.  Adarryll was seen today for follow-up.  Diagnoses and all orders for this visit:  Depression, unspecified depression type -     venlafaxine XR (EFFEXOR-XR) 150 MG 24 hr capsule; Take 2 capsules (300 mg total) by mouth daily with breakfast. -     lithium carbonate 300 MG capsule; 1qam and 2qhs  Anxiety state -     venlafaxine XR (EFFEXOR-XR) 150 MG 24 hr capsule; Take 2 capsules (300 mg total) by mouth daily with breakfast. -     busPIRone (BUSPAR) 15 MG tablet; 1 bid     Please see After Visit Summary for patient  specific instructions.  Future Appointments  Date Time Provider [place]  02/14/2020  9:00 AM Thayer Headings, PMHNP CP-CP None    No orders of the defined types were placed in this encounter.   -------------------------------

## 2019-12-24 ENCOUNTER — Other Ambulatory Visit: Payer: Self-pay

## 2019-12-24 DIAGNOSIS — F411 Generalized anxiety disorder: Secondary | ICD-10-CM

## 2019-12-24 DIAGNOSIS — F32A Depression, unspecified: Secondary | ICD-10-CM

## 2019-12-24 DIAGNOSIS — F329 Major depressive disorder, single episode, unspecified: Secondary | ICD-10-CM

## 2019-12-24 MED ORDER — VENLAFAXINE HCL ER 150 MG PO CP24: 300.0000 mg | ORAL_CAPSULE | Freq: Every day | ORAL | 0 refills | Status: DC

## 2019-12-24 MED ORDER — LITHIUM CARBONATE 300 MG PO CAPS: ORAL_CAPSULE | ORAL | 0 refills | Status: DC

## 2019-12-24 MED ORDER — BUSPIRONE HCL 15 MG PO TABS: ORAL_TABLET | ORAL | 0 refills | Status: DC

## 2020-01-13 ENCOUNTER — Ambulatory Visit
Admission: RE | Admit: 2020-01-13 | Discharge: 2020-01-13 | Disposition: A | Discharge status: Home / Self Care | Payer: Medicare Other | Source: Ambulatory Visit | Attending: Family Medicine | Admitting: Family Medicine

## 2020-01-13 ENCOUNTER — Other Ambulatory Visit: Payer: Self-pay | Admitting: Family Medicine

## 2020-01-13 DIAGNOSIS — F319 Bipolar disorder, unspecified: Secondary | ICD-10-CM | POA: Diagnosis not present

## 2020-01-13 DIAGNOSIS — R0600 Dyspnea, unspecified: Secondary | ICD-10-CM

## 2020-01-13 DIAGNOSIS — G4733 Obstructive sleep apnea (adult) (pediatric): Secondary | ICD-10-CM | POA: Diagnosis not present

## 2020-01-13 DIAGNOSIS — Z79899 Other long term (current) drug therapy: Secondary | ICD-10-CM | POA: Diagnosis not present

## 2020-01-13 DIAGNOSIS — E1169 Type 2 diabetes mellitus with other specified complication: Secondary | ICD-10-CM | POA: Diagnosis not present

## 2020-01-13 DIAGNOSIS — E785 Hyperlipidemia, unspecified: Secondary | ICD-10-CM | POA: Diagnosis not present

## 2020-01-13 DIAGNOSIS — K219 Gastro-esophageal reflux disease without esophagitis: Secondary | ICD-10-CM | POA: Diagnosis not present

## 2020-01-13 DIAGNOSIS — F39 Unspecified mood [affective] disorder: Secondary | ICD-10-CM | POA: Diagnosis not present

## 2020-01-13 DIAGNOSIS — G2581 Restless legs syndrome: Secondary | ICD-10-CM | POA: Diagnosis not present

## 2020-01-13 DIAGNOSIS — R0602 Shortness of breath: Secondary | ICD-10-CM | POA: Diagnosis not present

## 2020-01-13 DIAGNOSIS — I10 Essential (primary) hypertension: Secondary | ICD-10-CM | POA: Diagnosis not present

## 2020-01-13 DIAGNOSIS — F17200 Nicotine dependence, unspecified, uncomplicated: Secondary | ICD-10-CM | POA: Diagnosis not present

## 2020-01-13 DIAGNOSIS — N529 Male erectile dysfunction, unspecified: Secondary | ICD-10-CM | POA: Diagnosis not present

## 2020-01-22 ENCOUNTER — Other Ambulatory Visit (HOSPITAL_COMMUNITY): Payer: Self-pay | Admitting: Respiratory Therapy

## 2020-01-22 DIAGNOSIS — R0902 Hypoxemia: Secondary | ICD-10-CM

## 2020-02-14 ENCOUNTER — Ambulatory Visit: Payer: Medicare Other | Admitting: Psychiatry

## 2020-03-11 ENCOUNTER — Ambulatory Visit (INDEPENDENT_AMBULATORY_CARE_PROVIDER_SITE_OTHER): Payer: Medicare Other | Admitting: Psychiatry

## 2020-03-11 ENCOUNTER — Encounter: Payer: Self-pay | Admitting: Psychiatry

## 2020-03-11 ENCOUNTER — Other Ambulatory Visit: Payer: Self-pay

## 2020-03-11 DIAGNOSIS — F411 Generalized anxiety disorder: Secondary | ICD-10-CM

## 2020-03-11 DIAGNOSIS — F329 Major depressive disorder, single episode, unspecified: Secondary | ICD-10-CM

## 2020-03-11 DIAGNOSIS — F32A Depression, unspecified: Secondary | ICD-10-CM

## 2020-03-11 MED ORDER — LITHIUM CARBONATE 300 MG PO CAPS: ORAL_CAPSULE | ORAL | 1 refills | Status: DC

## 2020-03-11 MED ORDER — BUSPIRONE HCL 15 MG PO TABS: ORAL_TABLET | ORAL | 1 refills | Status: DC

## 2020-03-11 MED ORDER — VENLAFAXINE HCL ER 150 MG PO CP24: 300.0000 mg | ORAL_CAPSULE | Freq: Every day | ORAL | 1 refills | Status: DC

## 2020-03-11 NOTE — Progress Notes (Signed)
Erik Holmes [id number] 04/07/1961 59 y.o.  Subjective:   Patient ID:  Erik Holmes. is a 59 y.o. (DOB Dec 08, 1960) male.  Chief Complaint:  Chief Complaint  Patient presents with  . Follow-up    h/o depression and anxiety    HPI Erik Holmes. presents to the office today for follow-up of mood. He reports, "I can't complain." He reports that he notices some ST memory impairment and that it varies from day to day. He reports that his wife normally keeps up with all of his appointments. Denies depressed mood. He reports that he had some brief sadness around the anniversary of his father's death 3 years ago. He reports anxiety has been "borderline" and manageable. Sleeping well and denies any difficulty falling asleep. He reports that he is consistently wearing his cPap machine. His appetite has been good and he has been trying to lose weight. He reports that his motivation is good. Energy is lower than he would like. Concentration has been adequate. Denies SI.   Has been breeding dogs. Will have some cava-poo's. He enjoys his dogs.    Mini-Mental     Office Visit from 07/29/2014 in [facility]  Total Score (max 30 points )  27    PHQ2-9     Office Visit from 02/13/2018 in Primary Care at [facility] Total Score  0       Review of Systems:  Review of Systems  Gastrointestinal: Negative.   Musculoskeletal: Negative for gait problem.  Neurological: Negative for tremors and headaches.  Psychiatric/Behavioral:       Please refer to HPI    Medications: I have reviewed the patient's current medications.  Current Outpatient Medications  Medication Sig Dispense Refill  . albuterol (PROVENTIL HFA;VENTOLIN HFA) 108 (90 Base) MCG/ACT inhaler Inhale 2 puffs into the lungs every 6 (six) hours as needed for wheezing or shortness of breath. 1 Inhaler 0  . busPIRone (BUSPAR) 15 MG tablet 1 bid 180 tablet 1  . EPINEPHrine (EPIPEN 2-PAK) 0.3 mg/0.3 mL  IJ SOAJ injection Take 0.3 mg by mouth daily as needed for anaphylaxis.    Marland Kitchen lisinopril (PRINIVIL,ZESTRIL) 10 MG tablet Take 10 mg by mouth daily.  0  . lithium carbonate 300 MG capsule 1qam and 2qhs 270 capsule 1  . metFORMIN (GLUCOPHAGE-XR) 500 MG 24 hr tablet Take 1,000 mg by mouth 2 (two) times a day.     . NON FORMULARY CPAP AT BEDTIME    . polyethylene glycol powder (GLYCOLAX/MIRALAX) powder Take 1 Container by mouth once.    . rosuvastatin (CRESTOR) 5 MG tablet TK 1 T PO QD FOR CHOLESTEROL    . tadalafil (CIALIS) 20 MG tablet Take 10-20 mg by mouth daily as needed for erectile dysfunction.    Marland Kitchen venlafaxine XR (EFFEXOR-XR) 150 MG 24 hr capsule Take 2 capsules (300 mg total) by mouth daily with breakfast. 180 capsule 1  . aspirin EC 325 MG tablet Take 1 tablet (325 mg total) by mouth 2 (two) times daily. (Patient not taking: Reported on 06/05/2018) 60 tablet 0  . oxyCODONE (ROXICODONE) 5 MG immediate release tablet Take 2-3 tablets (10-15 mg total) by mouth every 4 (four) hours as needed for severe pain or breakthrough pain. (Patient not taking: Reported on 02/13/2018) 60 tablet 0   No current facility-administered medications for this visit.    Medication Side Effects: None  Allergies:  Allergies  Allergen Reactions  . Penicillins Anaphylaxis and Other (See  Comments)    Has patient had a PCN reaction causing immediate rash, facial/tongue/throat swelling, SOB or lightheadedness with hypotension:Yes Has patient had a PCN reaction causing severe rash involving mucus membranes or skin necrosis:No Has patient had a PCN reaction that required hospitalization:Treated in ER for reaction Has patient had a PCN reaction occurring within the last 10 years:Yes If all of the above answers are "NO", then may proceed with Cephalosporin use.   . Codeine Other (See Comments)    Made pt feel like skin was crawling  . Other Other (See Comments)    "bee stings"  . Sildenafil Citrate Other (See  Comments)  . Xanax [Alprazolam]     Made him feel angry    Past Medical History:  Diagnosis Date  . Allergy   . Anxiety   . Cataract   . Degenerative disc disease, lumbar   . Depression   . Diabetes mellitus without complication ([place])   . ED (erectile dysfunction)   . GERD (gastroesophageal reflux disease)   . Gout   . Hypertension   . Internal hemorrhoids with prolapse, bleeding 04/24/2014  . RLS (restless legs syndrome)   . Seizures ([place])    last seizure around 2016 per patient.  . Shortness of breath   . Sleep apnea    cpap  . Tubular adenoma of colon    multiple, TV adenomas also    Family History  Problem Relation Age of Onset  . Cancer Mother   . Breast cancer Mother   . Osteoporosis Mother   . Heart disease Father   . Colon cancer Neg Hx   . Esophageal cancer Neg Hx   . Liver cancer Neg Hx   . Pancreatic cancer Neg Hx   . Stomach cancer Neg Hx   . Rectal cancer Neg Hx     Social History   Socioeconomic History  . Marital status: Married    Spouse name: Erik Holmes  . Number of children: 5  . Years of education: 12th  . Highest education level: Not on file  Occupational History    Employer: UNEMPLOYED  Tobacco Use  . Smoking status: Current Every Day Smoker    Packs/day: 2.00    Years: 40.00    Pack years: 80.00    Types: Cigarettes    Last attempt to quit: 11/18/2013    Years since quitting: 6.3  . Smokeless tobacco: Never Used  Substance and Sexual Activity  . Alcohol use: No  . Drug use: Yes    Types: Marijuana  . Sexual activity: Not on file  Other Topics Concern  . Not on file  Social History Narrative   Married, 5 grown children   Retired/disabled Dealer   Patient lives at home with his spouse.   Caffeine use: 2 cups daily   Social Determinants of Health   Financial Resource Strain:   . Difficulty of Paying Living Expenses:   Food Insecurity:   . Worried About Charity fundraiser in the Last Year:   . Arboriculturist in the Last  Year:   Transportation Needs:   . Film/video editor (Medical):   Marland Kitchen Lack of Transportation (Non-Medical):   Physical Activity:   . Days of Exercise per Week:   . Minutes of Exercise per Session:   Stress:   . Feeling of Stress :   Social Connections:   . Frequency of Communication with Friends and Family:   . Frequency of Social Gatherings with Friends and  Family:   . Attends Religious Services:   . Active Member of Clubs or Organizations:   . Attends Archivist Meetings:   Marland Kitchen Marital Status:   Intimate Partner Violence:   . Fear of Current or Ex-Partner:   . Emotionally Abused:   Marland Kitchen Physically Abused:   . Sexually Abused:     Past Medical History, Surgical history, Social history, and Family history were reviewed and updated as appropriate.   Please see review of systems for further details on the patient's review from today.   Objective:   Physical Exam:  There were no vitals taken for this visit.  Physical Exam Constitutional:      General: He is not in acute distress. Musculoskeletal:        General: No deformity.  Neurological:     Mental Status: He is alert and oriented to person, place, and time.     Coordination: Coordination normal.  Psychiatric:        Attention and Perception: Attention and perception normal. He does not perceive auditory or visual hallucinations.        Mood and Affect: Mood normal. Mood is not anxious or depressed. Affect is not labile, blunt, angry or inappropriate.        Speech: Speech normal.        Behavior: Behavior normal.        Thought Content: Thought content normal. Thought content is not paranoid or delusional. Thought content does not include homicidal or suicidal ideation. Thought content does not include homicidal or suicidal plan.        Cognition and Memory: Cognition and memory normal.        Judgment: Judgment normal.     Comments: Insight intact     Lab Review:     Component Value Date/Time   NA 137  02/05/2017 0355   K 4.4 02/05/2017 0355   CL 106 02/05/2017 0355   CO2 27 02/05/2017 0355   GLUCOSE 209 (H) 02/05/2017 0355   BUN 15 02/05/2017 0355   CREATININE 0.85 02/05/2017 0355   CALCIUM 8.7 (L) 02/05/2017 0355   PROT 6.8 01/26/2012 0915   ALBUMIN 3.7 01/26/2012 0915   AST 17 01/26/2012 0915   ALT 21 01/26/2012 0915   ALKPHOS 54 01/26/2012 0915   BILITOT 0.3 01/26/2012 0915   GFRNONAA >60 02/05/2017 0355   GFRAA >60 02/05/2017 0355       Component Value Date/Time   WBC 17.3 (H) 02/05/2017 0355   RBC 4.75 02/05/2017 0355   HGB 13.6 02/05/2017 0355   HCT 41.7 02/05/2017 0355   PLT 263 02/05/2017 0355   MCV 87.8 02/05/2017 0355   MCH 28.6 02/05/2017 0355   MCHC 32.6 02/05/2017 0355   RDW 13.6 02/05/2017 0355   LYMPHSABS 2.7 01/26/2012 0915   MONOABS 0.6 01/26/2012 0915   EOSABS 0.1 01/26/2012 0915   BASOSABS 0.0 01/26/2012 0915    No results found for: POCLITH, LITHIUM   No results found for: PHENYTOIN, PHENOBARB, VALPROATE, CBMZ   .res Assessment: Plan:   Will continue current plan of care since target signs and symptoms are well controlled without any tolerability issues. Pt reports that he has an annual physical exam scheduled this summer and will request that PCP send lab results to this office. Will request that Lithium level be included in next scheduled labs. Pt to f/u in 4 months or sooner if clinically indicated.  Patient advised to contact office with any questions, adverse effects, or  acute worsening in signs and symptoms.  Kiyon was seen today for follow-up.  Diagnoses and all orders for this visit:  Anxiety state -     busPIRone (BUSPAR) 15 MG tablet; 1 bid -     venlafaxine XR (EFFEXOR-XR) 150 MG 24 hr capsule; Take 2 capsules (300 mg total) by mouth daily with breakfast.  Depression, unspecified depression type -     venlafaxine XR (EFFEXOR-XR) 150 MG 24 hr capsule; Take 2 capsules (300 mg total) by mouth daily with breakfast. -     lithium  carbonate 300 MG capsule; 1qam and 2qhs     Please see After Visit Summary for patient specific instructions.  Future Appointments  Date Time Provider [place]  07/10/2020  8:00 AM Thayer Headings, PMHNP CP-CP None    No orders of the defined types were placed in this encounter.   -------------------------------

## 2020-03-12 ENCOUNTER — Telehealth: Payer: Self-pay | Admitting: Psychiatry

## 2020-03-12 NOTE — Telephone Encounter (Signed)
Jasmin @ Dr. Gaynelle Arabian office left message asking if you received labs from February for pt Lithium Level? Also, pt has follow up in August and would like to know if Lithium Level labs need to be taken then as well? Please advise Jasmin @ [phone]

## 2020-03-13 NOTE — Telephone Encounter (Signed)
Jasmin notified you would like lithium level check and faxed to you at pt next follow up in August.

## 2020-04-22 DIAGNOSIS — M7711 Lateral epicondylitis, right elbow: Secondary | ICD-10-CM | POA: Diagnosis not present

## 2020-04-22 DIAGNOSIS — M19021 Primary osteoarthritis, right elbow: Secondary | ICD-10-CM | POA: Diagnosis not present

## 2020-04-22 DIAGNOSIS — M25522 Pain in left elbow: Secondary | ICD-10-CM | POA: Diagnosis not present

## 2020-04-22 DIAGNOSIS — M25521 Pain in right elbow: Secondary | ICD-10-CM | POA: Diagnosis not present

## 2020-04-22 DIAGNOSIS — M7712 Lateral epicondylitis, left elbow: Secondary | ICD-10-CM | POA: Diagnosis not present

## 2020-04-22 DIAGNOSIS — M19022 Primary osteoarthritis, left elbow: Secondary | ICD-10-CM | POA: Diagnosis not present

## 2020-04-29 IMAGING — US ULTRASOUND OF SCROTUM
1 series · 14 of 25 positions shown · non-contrast
Comparison: None.

CLINICAL DATA: Palpable testicular mass

EXAM:
ULTRASOUND OF SCROTUM
TECHNIQUE: Complete ultrasound examination of the testicles, epididymis, and
other scrotal structures was performed.

[Series 1: ultrasound of scrotum · 0.05mm/px · 14 of 57 slices shown]
[im 1/57]
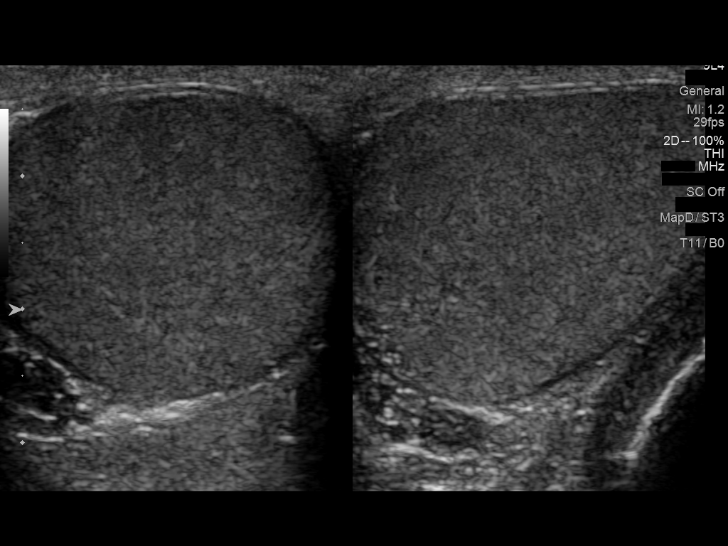
[im 5/57]
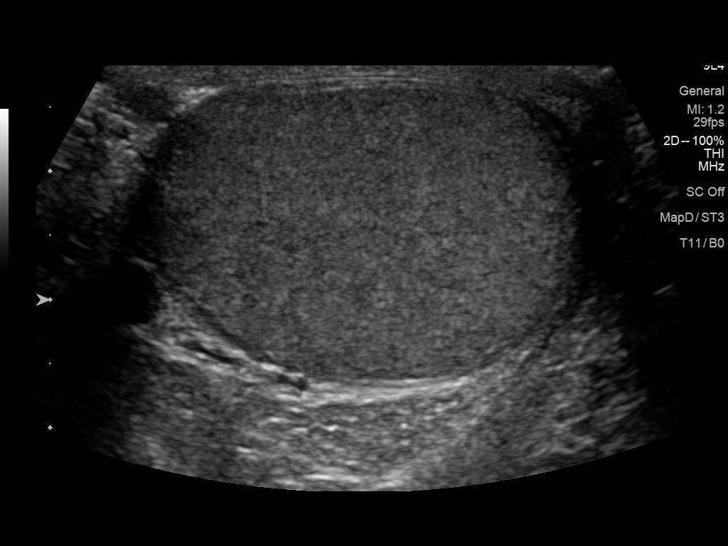
[im 10/57]
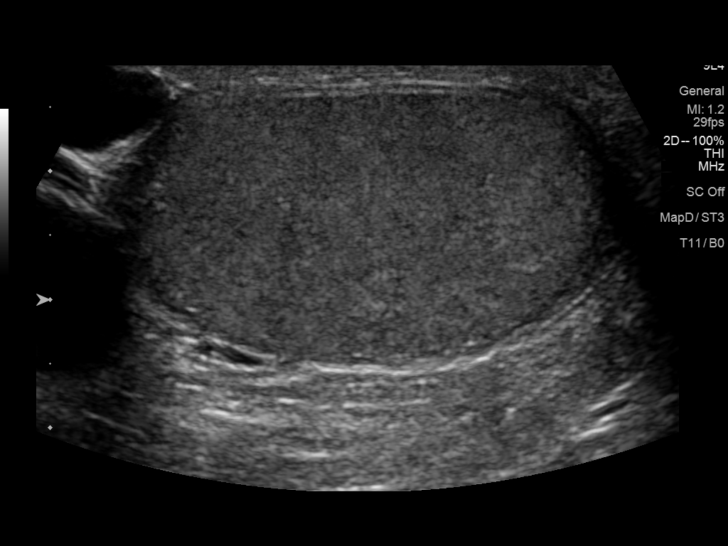
[im 15/57]
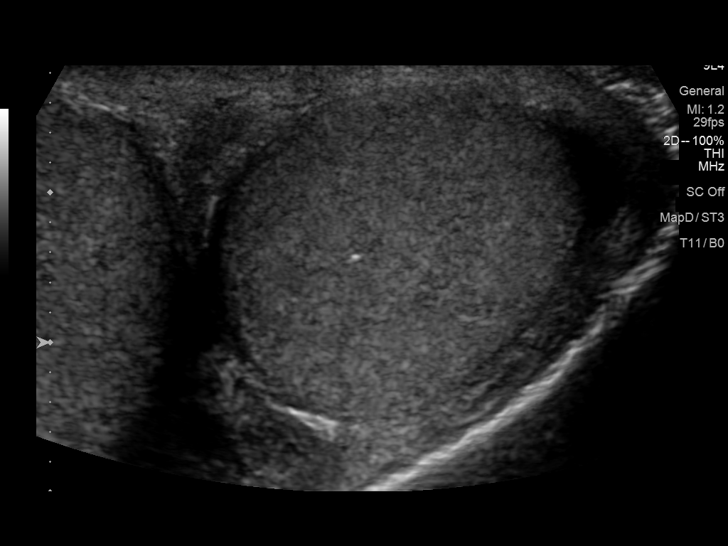
[im 19/57]
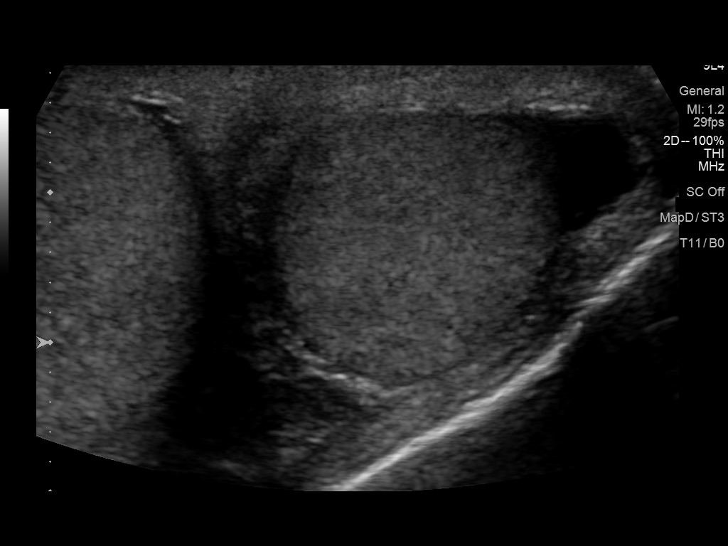
[im 22/57]
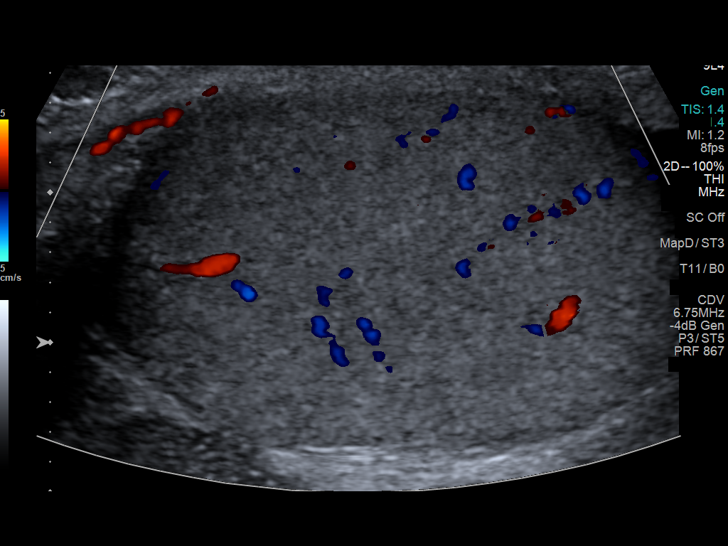
[im 26/57]
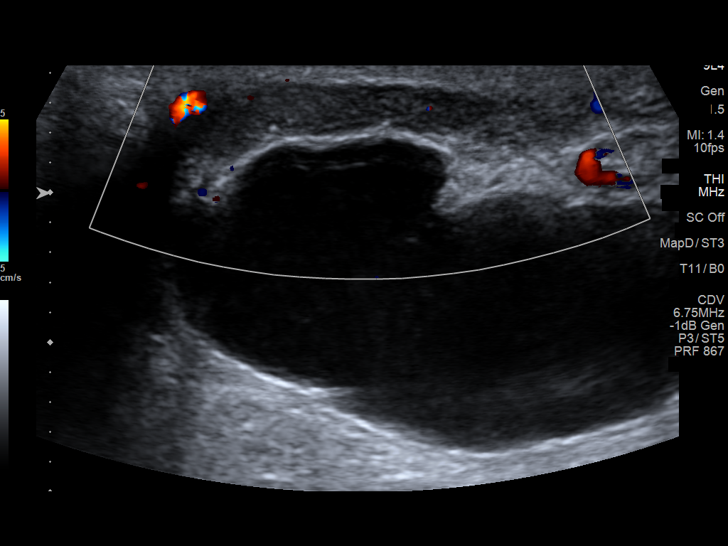
[im 31/57]
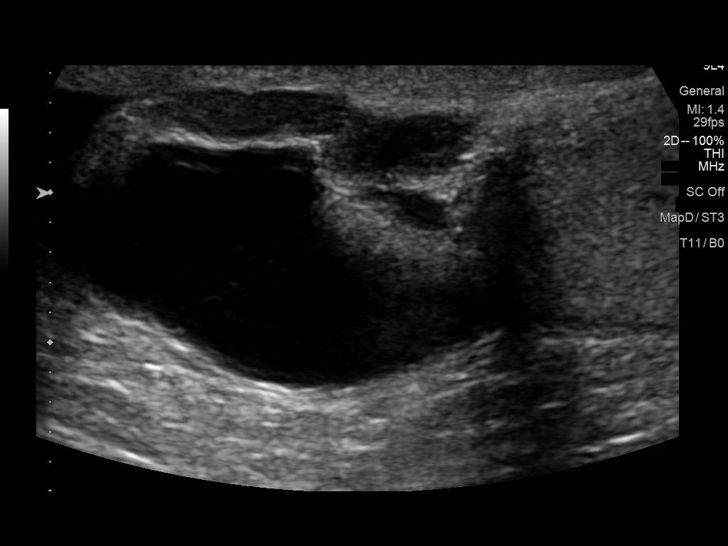
[im 36/57]
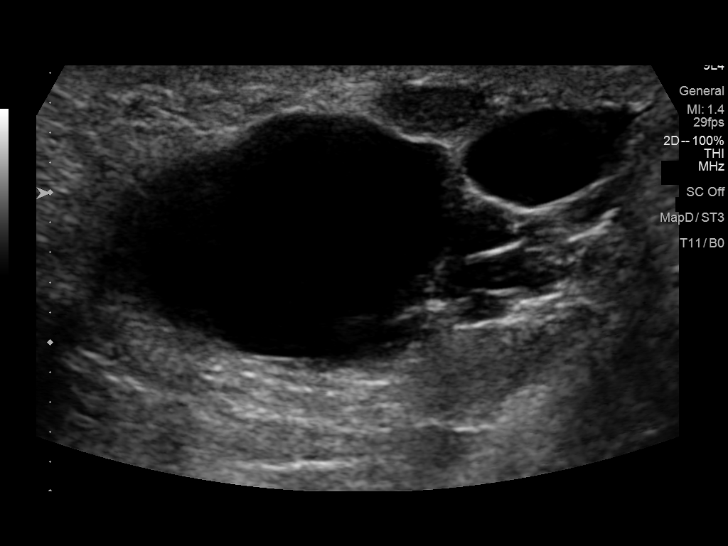
[im 38/57]
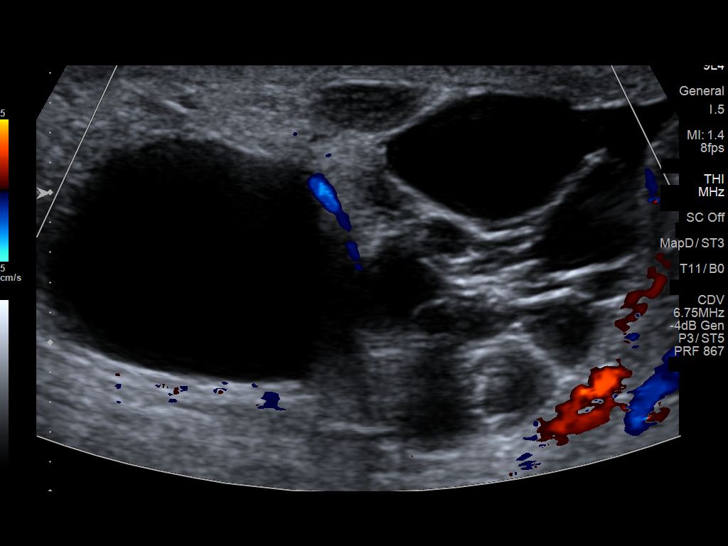
[im 43/57]
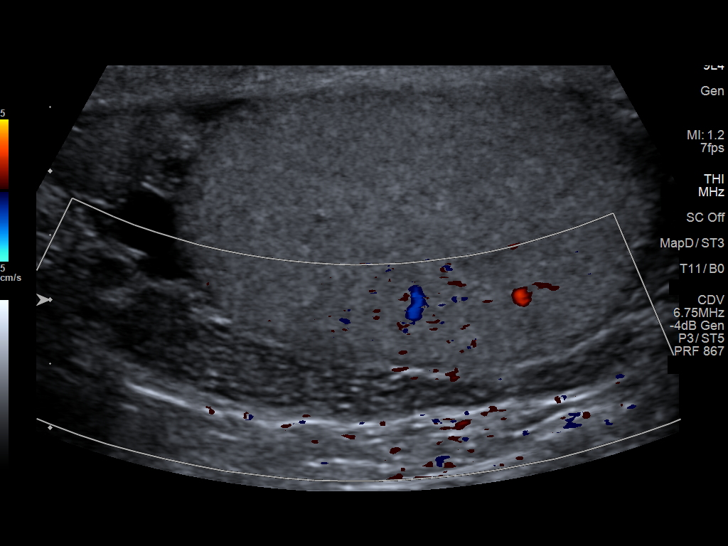
[im 47/57]
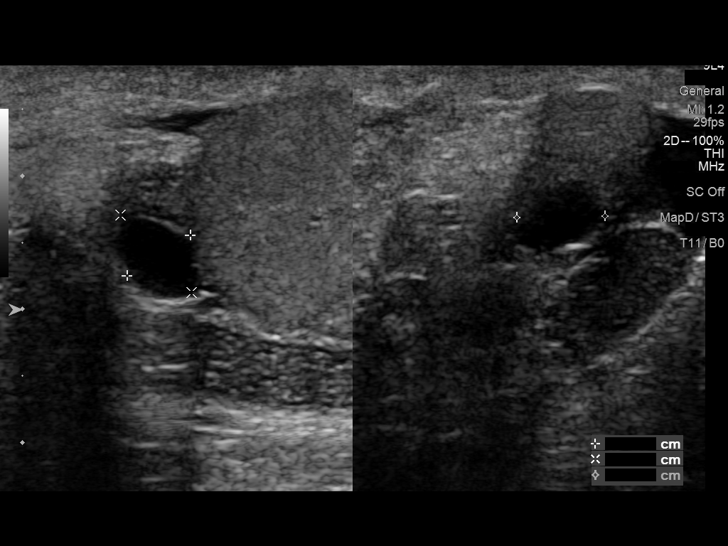
[im 52/57]
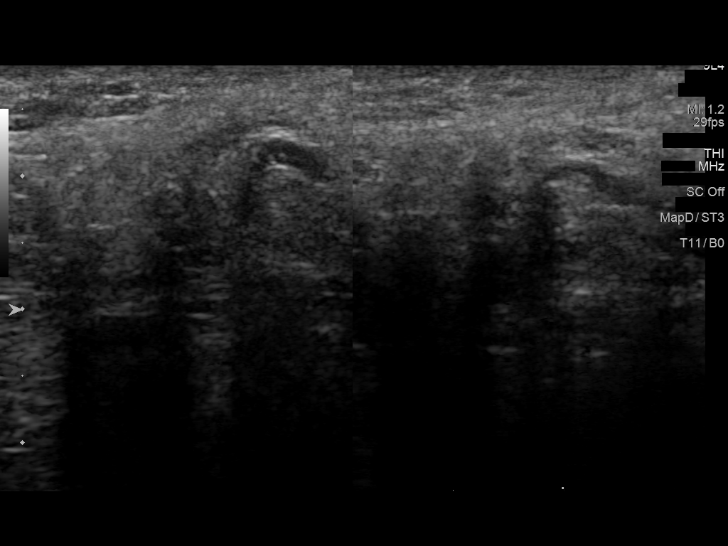
[im 57/57]
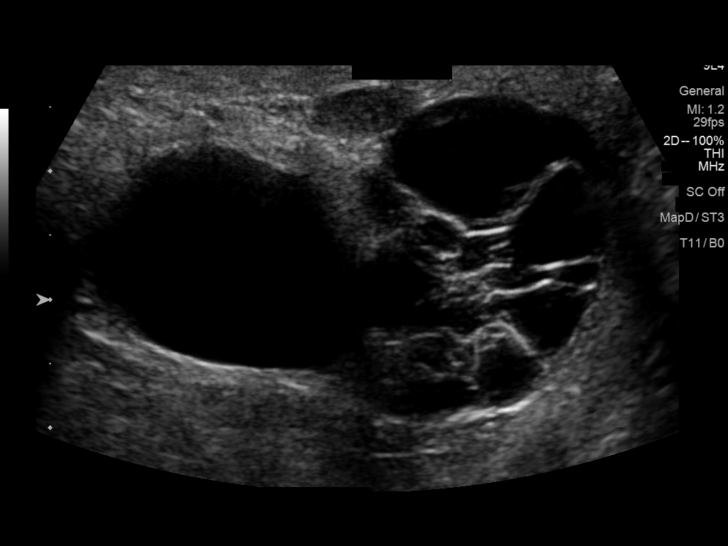

[14 of 25 positions shown; findings below may reference images not displayed]

FINDINGS: Right testicle

Measurements: 3.6 x 2.1 x 3.3 cm. No mass or microlithiasis
visualized.

Left testicle

Measurements: 3.9 x 2.3 x 2.9 cm. No mass or microlithiasis
visualized.

Right epididymis: Multiple anechoic cystic foci are present within
the right epididymal head, largest cystic structure measuring 3.1 x
1.6 x 2.3 cm.

Left epididymis: Few left epididymal head cysts are present, largest
measuring 0.6 x 0.8 x 0.7 cm. No concerning left epididymal masses.

Hydrocele:  None visualized.

Varicocele:  None visualized.
IMPRESSION: Palpable area appears to correspond to a conglomeration several
large right epididymal head cysts, largest measuring up to 3.1 x
x 2.3 cm.

No testicular masses or microlithiasis.

## 2020-06-19 DIAGNOSIS — M7712 Lateral epicondylitis, left elbow: Secondary | ICD-10-CM | POA: Diagnosis not present

## 2020-06-19 DIAGNOSIS — M7711 Lateral epicondylitis, right elbow: Secondary | ICD-10-CM | POA: Diagnosis not present

## 2020-06-19 DIAGNOSIS — M19021 Primary osteoarthritis, right elbow: Secondary | ICD-10-CM | POA: Diagnosis not present

## 2020-06-19 DIAGNOSIS — M19022 Primary osteoarthritis, left elbow: Secondary | ICD-10-CM | POA: Diagnosis not present

## 2020-07-10 ENCOUNTER — Ambulatory Visit (INDEPENDENT_AMBULATORY_CARE_PROVIDER_SITE_OTHER): Payer: Medicare Other | Admitting: Psychiatry

## 2020-07-10 ENCOUNTER — Other Ambulatory Visit: Payer: Self-pay

## 2020-07-10 ENCOUNTER — Encounter: Payer: Self-pay | Admitting: Psychiatry

## 2020-07-10 DIAGNOSIS — F411 Generalized anxiety disorder: Secondary | ICD-10-CM | POA: Diagnosis not present

## 2020-07-10 DIAGNOSIS — F329 Major depressive disorder, single episode, unspecified: Secondary | ICD-10-CM

## 2020-07-10 DIAGNOSIS — F32A Depression, unspecified: Secondary | ICD-10-CM

## 2020-07-10 MED ORDER — LITHIUM CARBONATE 300 MG PO CAPS: ORAL_CAPSULE | ORAL | 1 refills | Status: DC

## 2020-07-10 MED ORDER — BUSPIRONE HCL 15 MG PO TABS: ORAL_TABLET | ORAL | 1 refills | Status: DC

## 2020-07-10 MED ORDER — VENLAFAXINE HCL ER 150 MG PO CP24: 300.0000 mg | ORAL_CAPSULE | Freq: Every day | ORAL | 1 refills | Status: DC

## 2020-07-10 NOTE — Progress Notes (Signed)
Erik Holmes [id number] [birth year] 59 y.o.  Subjective:   Patient ID:  Erik Holmes. is a 59 y.o. (DOB 1961-11-01) male.  Chief Complaint:  Chief Complaint  Patient presents with  . Follow-up    h/o Depression    HPI Erik Holmes. presents to the office today for follow-up of mood disturbance. He reports low energy. He reports that he has the interest and desire to do things and then is not able to initiate them. He reports that the days he has energy he is tired by 2-3 pm. Denies depressed mood. He notices some slight irritability on occasion in response to a trigger.  Using cPap. He reports that cPap pressure seems to be different and is due for f/u with sleep specialist. He reports that he had very low energy before treatment for sleep apnea. He reports that he had excessive daytime somnolence before using his cPap. He reports that he is napping more during the daytime. He reports that he has not been as alert. Denies difficulty falling and staying asleep. Has had some anxiety that is manageable. He reports that he was losing weight and then was craving other foods and gained 5 lbs and then was able to lose weight. Concentration is ok. Denies SI.   He reports that his activities have been limited due to the pandemic.    Mini-Mental     Office Visit from 07/29/2014 in [facility]  Total Score (max 30 points ) 27    PHQ2-9     Office Visit from 02/13/2018 in [facility] at [facility] Total Score 0       Review of Systems:  Review of Systems  Musculoskeletal: Negative for gait problem.  Neurological: Negative for tremors.  Psychiatric/Behavioral:       Please refer to HPI     Has annual exam scheduled for next week and will request lab results be sent to this office.   Medications: I have reviewed the patient's current medications.  Current Outpatient Medications  Medication Sig Dispense Refill  . albuterol (PROVENTIL  HFA;VENTOLIN HFA) 108 (90 Base) MCG/ACT inhaler Inhale 2 puffs into the lungs every 6 (six) hours as needed for wheezing or shortness of breath. 1 Inhaler 0  . busPIRone (BUSPAR) 15 MG tablet 1 bid 180 tablet 1  . lisinopril (PRINIVIL,ZESTRIL) 10 MG tablet Take 10 mg by mouth daily.  0  . lithium carbonate 300 MG capsule 1qam and 2qhs 270 capsule 1  . metFORMIN (GLUCOPHAGE-XR) 500 MG 24 hr tablet Take 1,000 mg by mouth 2 (two) times a day.     . polyethylene glycol powder (GLYCOLAX/MIRALAX) powder Take 1 Container by mouth once.    . rosuvastatin (CRESTOR) 5 MG tablet TK 1 T PO QD FOR CHOLESTEROL    . tadalafil (CIALIS) 20 MG tablet Take 10-20 mg by mouth daily as needed for erectile dysfunction.    Marland Kitchen venlafaxine XR (EFFEXOR-XR) 150 MG 24 hr capsule Take 2 capsules (300 mg total) by mouth daily with breakfast. 180 capsule 1  . aspirin EC 325 MG tablet Take 1 tablet (325 mg total) by mouth 2 (two) times daily. (Patient not taking: Reported on 06/05/2018) 60 tablet 0  . EPINEPHrine (EPIPEN 2-PAK) 0.3 mg/0.3 mL IJ SOAJ injection Take 0.3 mg by mouth daily as needed for anaphylaxis.    . NON FORMULARY CPAP AT BEDTIME    . oxyCODONE (ROXICODONE) 5 MG immediate release tablet Take 2-3 tablets (10-15  mg total) by mouth every 4 (four) hours as needed for severe pain or breakthrough pain. (Patient not taking: Reported on 02/13/2018) 60 tablet 0   No current facility-administered medications for this visit.    Medication Side Effects: None  Allergies:  Allergies  Allergen Reactions  . Penicillins Anaphylaxis and Other (See Comments)    Has patient had a PCN reaction causing immediate rash, facial/tongue/throat swelling, SOB or lightheadedness with hypotension:Yes Has patient had a PCN reaction causing severe rash involving mucus membranes or skin necrosis:No Has patient had a PCN reaction that required hospitalization:Treated in ER for reaction Has patient had a PCN reaction occurring within the last  10 years:Yes If all of the above answers are "NO", then may proceed with Cephalosporin use.   . Codeine Other (See Comments)    Made pt feel like skin was crawling  . Other Other (See Comments)    "bee stings"  . Sildenafil Citrate Other (See Comments)  . Xanax [Alprazolam]     Made him feel angry    Past Medical History:  Diagnosis Date  . Allergy   . Anxiety   . Cataract   . Degenerative disc disease, lumbar   . Depression   . Diabetes mellitus without complication ([place])   . Diabetes mellitus, type II ([place])   . ED (erectile dysfunction)   . GERD (gastroesophageal reflux disease)   . Gout   . Hypertension   . Internal hemorrhoids with prolapse, bleeding 04/24/2014  . RLS (restless legs syndrome)   . Seizures ([place])    last seizure around 2016 per patient.  . Shortness of breath   . Sleep apnea    cpap  . Tubular adenoma of colon    multiple, TV adenomas also    Family History  Problem Relation Age of Onset  . Cancer Mother   . Breast cancer Mother   . Osteoporosis Mother   . Heart disease Father   . Colon cancer Neg Hx   . Esophageal cancer Neg Hx   . Liver cancer Neg Hx   . Pancreatic cancer Neg Hx   . Stomach cancer Neg Hx   . Rectal cancer Neg Hx     Social History   Socioeconomic History  . Marital status: Married    Spouse name: [name]  . Number of children: 5  . Years of education: 12th  . Highest education level: Not on file  Occupational History    Employer: UNEMPLOYED  Tobacco Use  . Smoking status: Current Every Day Smoker    Packs/day: 2.00    Years: 40.00    Pack years: 80.00    Types: Cigarettes    Last attempt to quit: 11/18/2013    Years since quitting: 6.6  . Smokeless tobacco: Never Used  Substance and Sexual Activity  . Alcohol use: No  . Drug use: Yes    Types: Marijuana  . Sexual activity: Not on file  Other Topics Concern  . 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   Caffeine use: 2 cups daily   Social Determinants of Health   Financial Resource Strain:   . Difficulty of Paying Living Expenses: Not on file  Food Insecurity:   . Worried About Charity fundraiser in the Last Year: Not on file  . Ran Out of Food in the Last Year: Not on file  Transportation  Needs:   . Lack of Transportation (Medical): Not on file  . Lack of Transportation (Non-Medical): Not on file  Physical Activity:   . Days of Exercise per Week: Not on file  . Minutes of Exercise per Session: Not on file  Stress:   . Feeling of Stress : Not on file  Social Connections:   . Frequency of Communication with Friends and Family: Not on file  . Frequency of Social Gatherings with Friends and Family: Not on file  . Attends Religious Services: Not on file  . Active Member of Clubs or Organizations: Not on file  . Attends Archivist Meetings: Not on file  . Marital Status: Not on file  Intimate Partner Violence:   . Fear of Current or Ex-Partner: Not on file  . Emotionally Abused: Not on file  . Physically Abused: Not on file  . Sexually Abused: Not on file    Past Medical History, Surgical history, Social history, and Family history were reviewed and updated as appropriate.   Please see review of systems for further details on the patient's review from today.   Objective:   Physical Exam:  Wt 225 lb (102.1 kg)   BMI 33.23 kg/m   Physical Exam Constitutional:      General: He is not in acute distress. Musculoskeletal:        General: No deformity.  Neurological:     Mental Status: He is alert and oriented to person, place, and time.     Coordination: Coordination normal.  Psychiatric:        Attention and Perception: Attention and perception normal. He does not perceive auditory or visual hallucinations.        Mood and Affect: Mood normal. Mood is not anxious or depressed. Affect is not labile, blunt, angry or  inappropriate.        Speech: Speech normal.        Behavior: Behavior normal.        Thought Content: Thought content normal. Thought content is not paranoid or delusional. Thought content does not include homicidal or suicidal ideation. Thought content does not include homicidal or suicidal plan.        Cognition and Memory: Cognition and memory normal.        Judgment: Judgment normal.     Comments: Insight intact     Lab Review:     Component Value Date/Time   NA 137 02/05/2017 0355   K 4.4 02/05/2017 0355   CL 106 02/05/2017 0355   CO2 27 02/05/2017 0355   GLUCOSE 209 (H) 02/05/2017 0355   BUN 15 02/05/2017 0355   CREATININE 0.85 02/05/2017 0355   CALCIUM 8.7 (L) 02/05/2017 0355   PROT 6.8 01/26/2012 0915   ALBUMIN 3.7 01/26/2012 0915   AST 17 01/26/2012 0915   ALT 21 01/26/2012 0915   ALKPHOS 54 01/26/2012 0915   BILITOT 0.3 01/26/2012 0915   GFRNONAA >60 02/05/2017 0355   GFRAA >60 02/05/2017 0355       Component Value Date/Time   WBC 17.3 (H) 02/05/2017 0355   RBC 4.75 02/05/2017 0355   HGB 13.6 02/05/2017 0355   HCT 41.7 02/05/2017 0355   PLT 263 02/05/2017 0355   MCV 87.8 02/05/2017 0355   MCH 28.6 02/05/2017 0355   MCHC 32.6 02/05/2017 0355   RDW 13.6 02/05/2017 0355   LYMPHSABS 2.7 01/26/2012 0915   MONOABS 0.6 01/26/2012 0915   EOSABS 0.1 01/26/2012 0915   BASOSABS 0.0 01/26/2012  0915    No results found for: POCLITH, LITHIUM   No results found for: PHENYTOIN, PHENOBARB, VALPROATE, CBMZ   .res Assessment: Plan:   Discussed that patient's reported signs and symptoms are consistent with typical signs and symptoms of sleep apnea and patient reports that he has been concerned about a possible decrease in CPAP pressure.  He agrees that signs and symptoms are similar to those he experienced prior to treatment of sleep apnea.  He reports that he is due for follow-up with sleep specialist and will follow-up as soon as possible.  Discussed that there are  medications available to help with excessive daytime somnolence and fatigue associated with sleep apnea if signs and symptoms persist after possible changes with CPAP. Patient reports that he has an upcoming annual physical exam with his primary care provider and will request that lithium level be included with withdrawals and lab results be sent to this office. Will continue current medications without changes. Continue Effexor XR 300 mg daily for mood and anxiety. Continue lithium 300 mg in the morning and 600 mg at bedtime for mood signs and symptoms. Continue BuSpar 15 mg twice daily for anxiety. Patient to follow-up in 4 months or sooner if clinically indicated. Patient advised to contact office with any questions, adverse effects, or acute worsening in signs and symptoms.  Fleet was seen today for follow-up.  Diagnoses and all orders for this visit:  Depression, unspecified depression type -     lithium carbonate 300 MG capsule; 1qam and 2qhs -     venlafaxine XR (EFFEXOR-XR) 150 MG 24 hr capsule; Take 2 capsules (300 mg total) by mouth daily with breakfast.  Anxiety state -     busPIRone (BUSPAR) 15 MG tablet; 1 bid -     venlafaxine XR (EFFEXOR-XR) 150 MG 24 hr capsule; Take 2 capsules (300 mg total) by mouth daily with breakfast.     Please see After Visit Summary for patient specific instructions.  Future Appointments  Date Time Provider [place]  11/09/2020  8:00 AM Thayer Headings, PMHNP CP-CP None    No orders of the defined types were placed in this encounter.   -------------------------------

## 2020-07-15 DIAGNOSIS — I10 Essential (primary) hypertension: Secondary | ICD-10-CM | POA: Diagnosis not present

## 2020-07-15 DIAGNOSIS — Z86010 Personal history of colonic polyps: Secondary | ICD-10-CM | POA: Diagnosis not present

## 2020-07-15 DIAGNOSIS — Z79899 Other long term (current) drug therapy: Secondary | ICD-10-CM | POA: Diagnosis not present

## 2020-07-15 DIAGNOSIS — Z0000 Encounter for general adult medical examination without abnormal findings: Secondary | ICD-10-CM | POA: Diagnosis not present

## 2020-07-15 DIAGNOSIS — G4733 Obstructive sleep apnea (adult) (pediatric): Secondary | ICD-10-CM | POA: Diagnosis not present

## 2020-07-15 DIAGNOSIS — Z1389 Encounter for screening for other disorder: Secondary | ICD-10-CM | POA: Diagnosis not present

## 2020-07-15 DIAGNOSIS — E785 Hyperlipidemia, unspecified: Secondary | ICD-10-CM | POA: Diagnosis not present

## 2020-07-15 DIAGNOSIS — E1169 Type 2 diabetes mellitus with other specified complication: Secondary | ICD-10-CM | POA: Diagnosis not present

## 2020-07-15 DIAGNOSIS — G2581 Restless legs syndrome: Secondary | ICD-10-CM | POA: Diagnosis not present

## 2020-07-15 DIAGNOSIS — F319 Bipolar disorder, unspecified: Secondary | ICD-10-CM | POA: Diagnosis not present

## 2020-07-15 DIAGNOSIS — F411 Generalized anxiety disorder: Secondary | ICD-10-CM | POA: Diagnosis not present

## 2020-07-15 DIAGNOSIS — N529 Male erectile dysfunction, unspecified: Secondary | ICD-10-CM | POA: Diagnosis not present

## 2020-08-10 DIAGNOSIS — G2581 Restless legs syndrome: Secondary | ICD-10-CM | POA: Diagnosis not present

## 2020-08-10 DIAGNOSIS — R5383 Other fatigue: Secondary | ICD-10-CM | POA: Diagnosis not present

## 2020-08-10 DIAGNOSIS — G4733 Obstructive sleep apnea (adult) (pediatric): Secondary | ICD-10-CM | POA: Diagnosis not present

## 2020-10-28 IMAGING — CR DG CHEST 2V
2 series · 2 of 2 positions shown · non-contrast
Comparison: 04/22/2013

CLINICAL DATA: Shortness of breath.  Smoker.  Hypertension.

EXAM:
CHEST - 2 VIEW

[w chest pa]
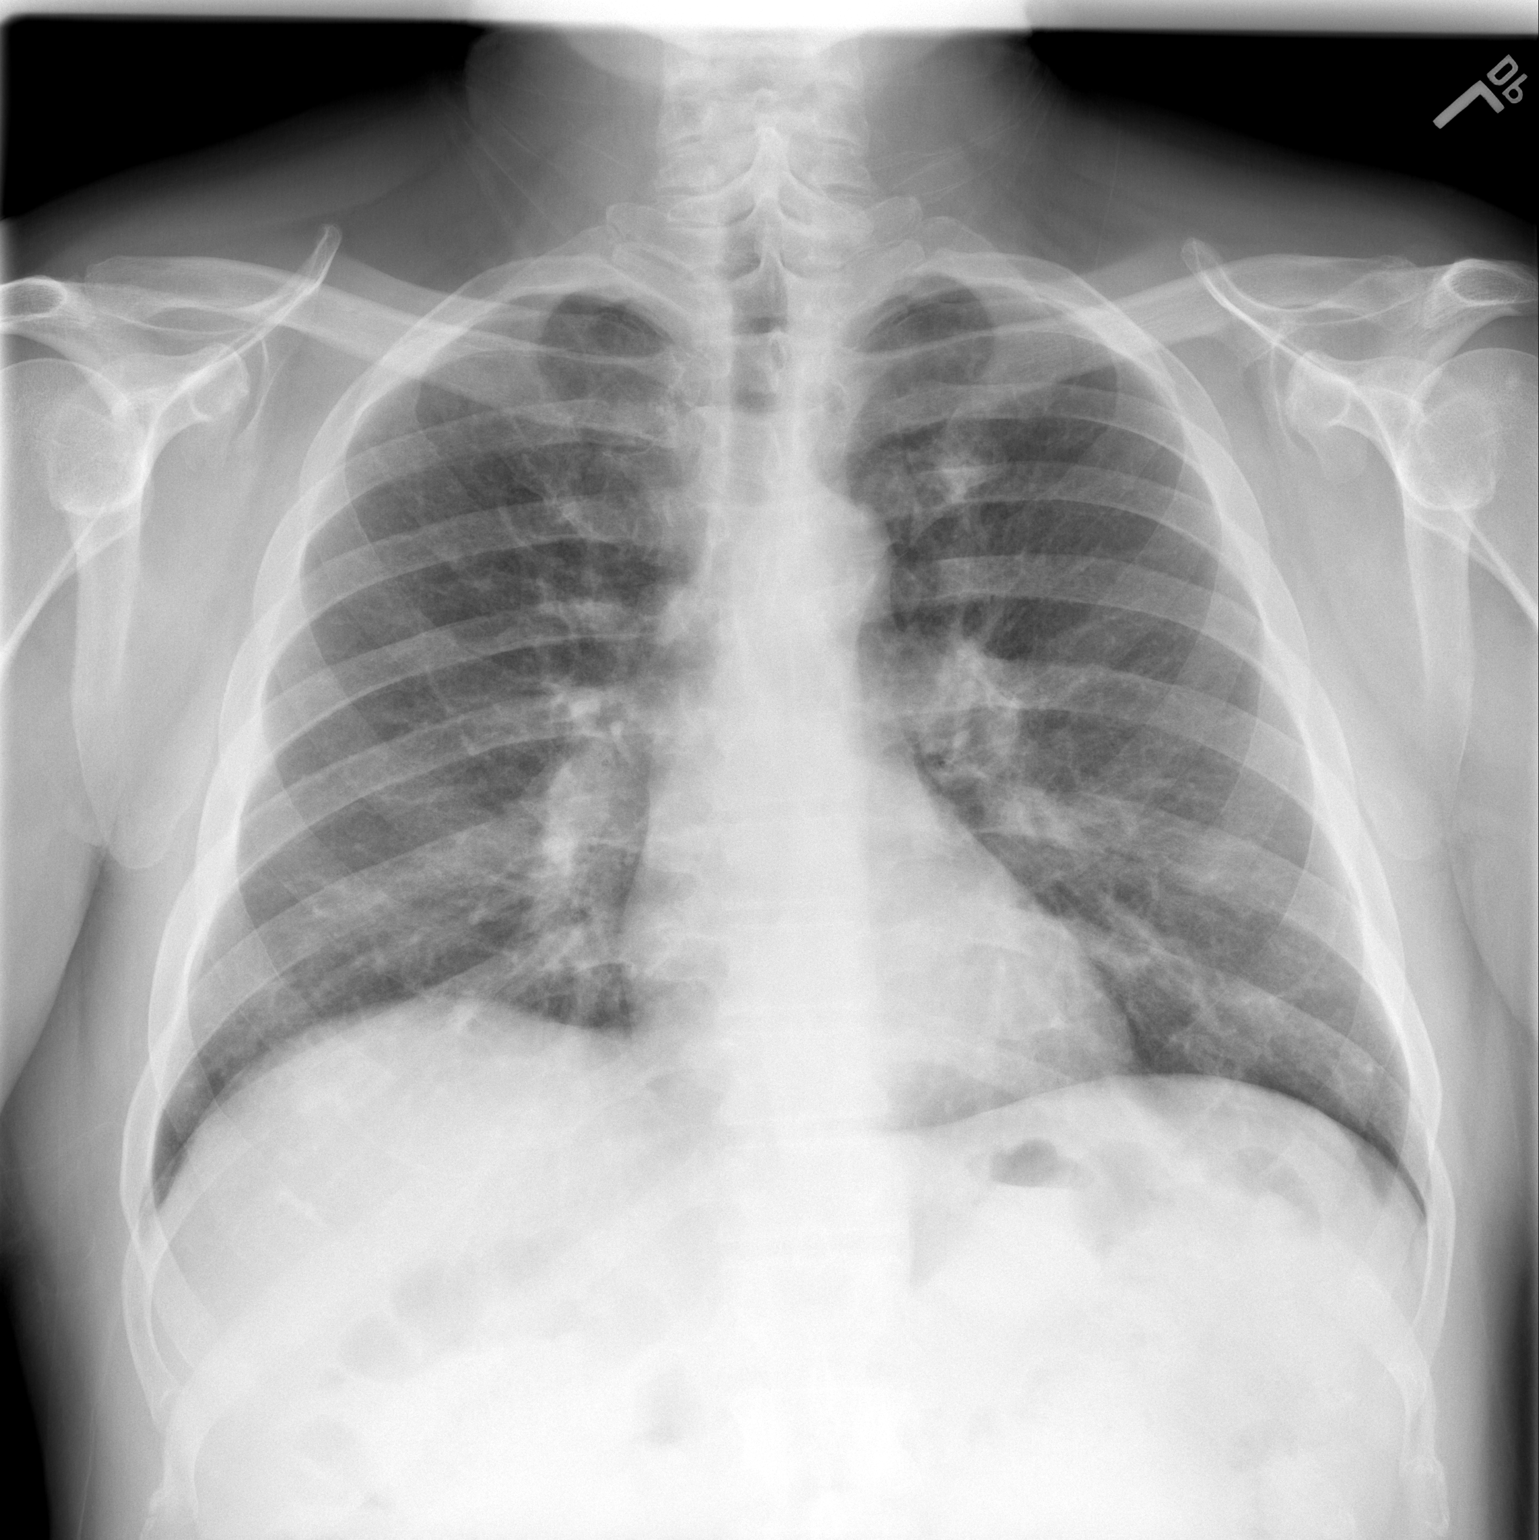

[w chest lat]
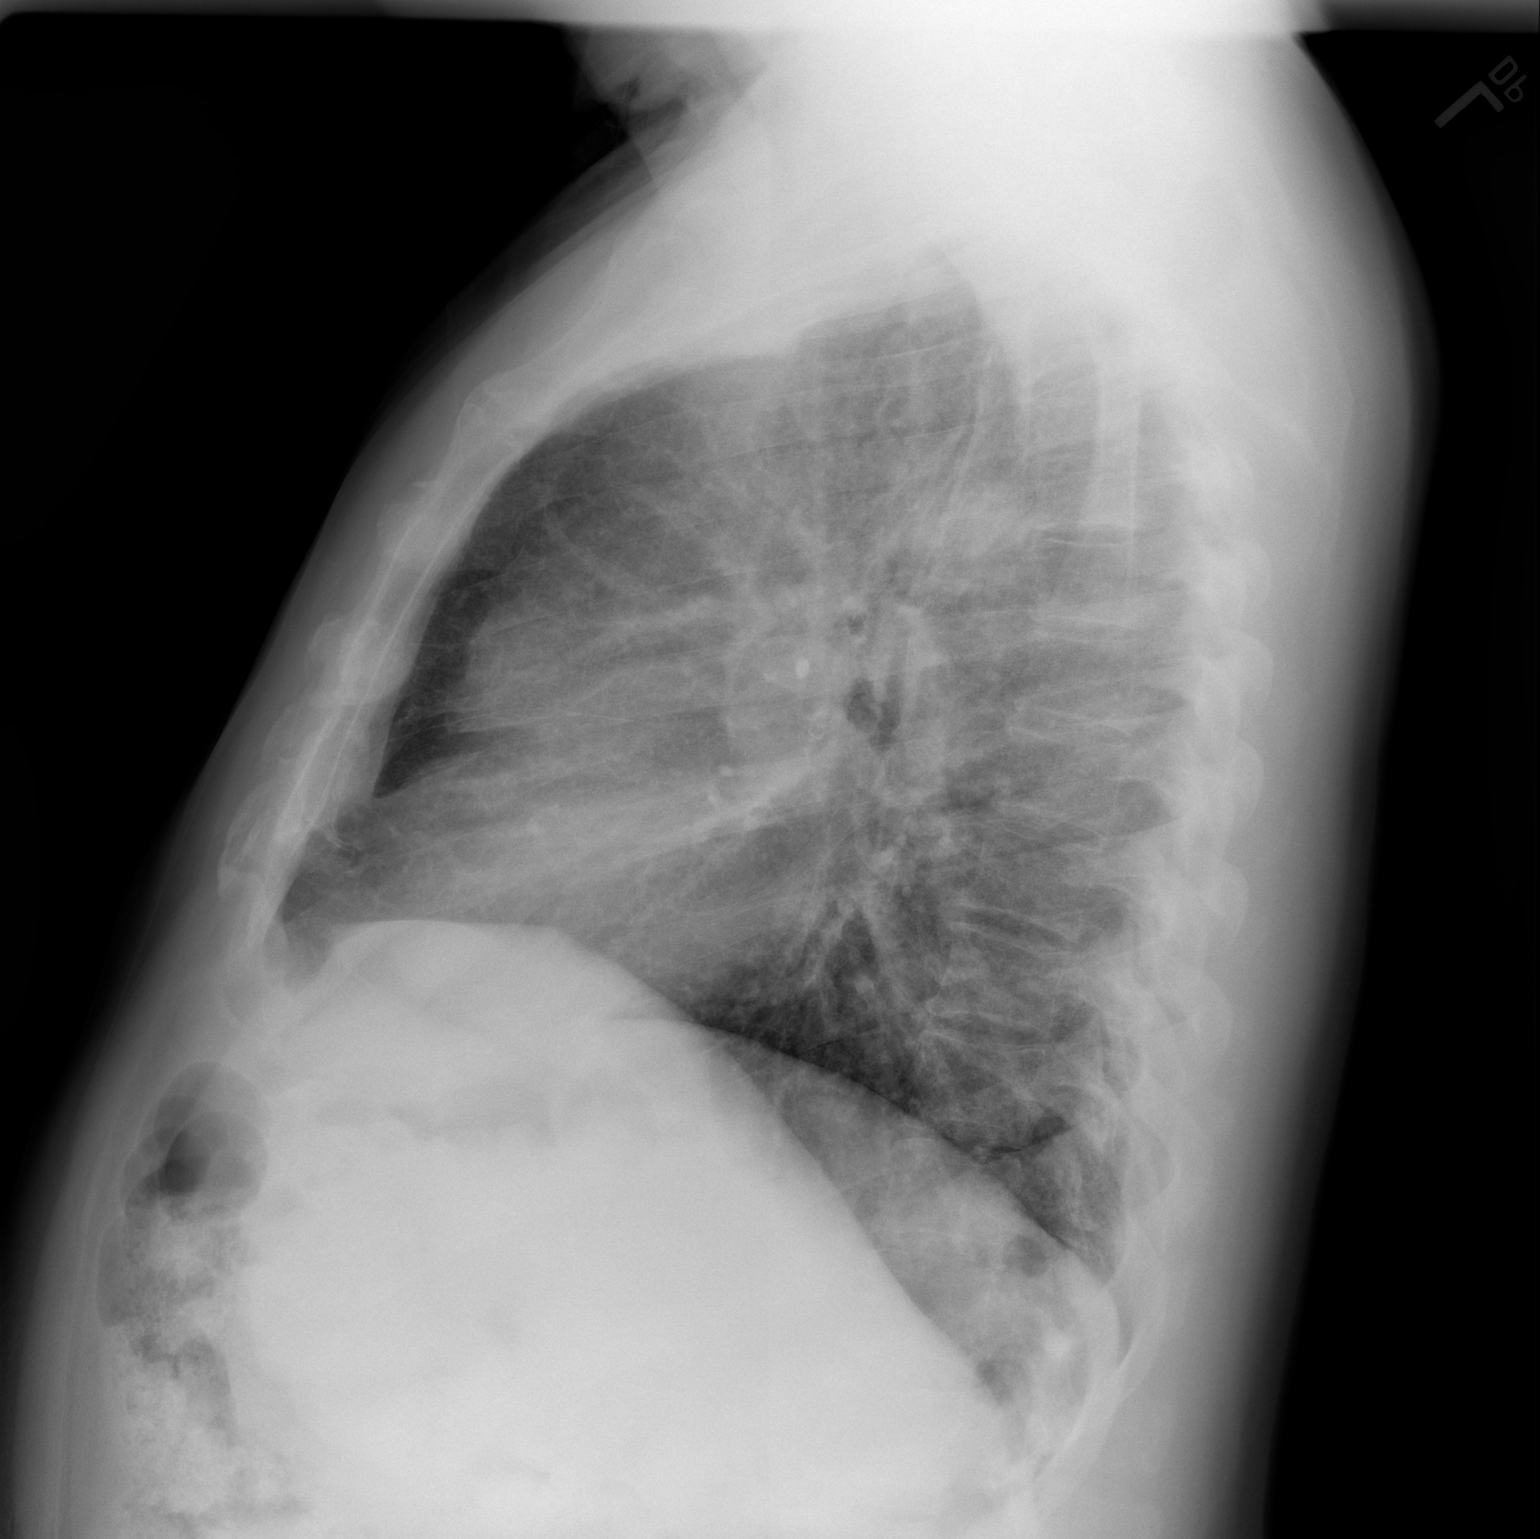

[2 of 2 positions shown; findings below may reference images not displayed]

FINDINGS: The heart size and mediastinal contours are within normal limits.
Both lungs are clear. The visualized skeletal structures are
unremarkable.
IMPRESSION: No active cardiopulmonary disease.

## 2020-11-09 ENCOUNTER — Ambulatory Visit: Payer: Medicare Other | Admitting: Psychiatry

## 2020-12-22 ENCOUNTER — Other Ambulatory Visit: Payer: Self-pay | Admitting: Psychiatry

## 2020-12-22 DIAGNOSIS — F411 Generalized anxiety disorder: Secondary | ICD-10-CM

## 2020-12-22 DIAGNOSIS — F32A Depression, unspecified: Secondary | ICD-10-CM

## 2020-12-23 NOTE — Telephone Encounter (Signed)
Next visit is 02/11/21

## 2021-01-18 DIAGNOSIS — Z86010 Personal history of colonic polyps: Secondary | ICD-10-CM | POA: Diagnosis not present

## 2021-01-18 DIAGNOSIS — F319 Bipolar disorder, unspecified: Secondary | ICD-10-CM | POA: Diagnosis not present

## 2021-01-18 DIAGNOSIS — Z79899 Other long term (current) drug therapy: Secondary | ICD-10-CM | POA: Diagnosis not present

## 2021-01-18 DIAGNOSIS — F39 Unspecified mood [affective] disorder: Secondary | ICD-10-CM | POA: Diagnosis not present

## 2021-01-18 DIAGNOSIS — E1169 Type 2 diabetes mellitus with other specified complication: Secondary | ICD-10-CM | POA: Diagnosis not present

## 2021-01-18 DIAGNOSIS — N529 Male erectile dysfunction, unspecified: Secondary | ICD-10-CM | POA: Diagnosis not present

## 2021-01-18 DIAGNOSIS — E785 Hyperlipidemia, unspecified: Secondary | ICD-10-CM | POA: Diagnosis not present

## 2021-01-18 DIAGNOSIS — G2581 Restless legs syndrome: Secondary | ICD-10-CM | POA: Diagnosis not present

## 2021-01-18 DIAGNOSIS — I10 Essential (primary) hypertension: Secondary | ICD-10-CM | POA: Diagnosis not present

## 2021-01-18 DIAGNOSIS — G4733 Obstructive sleep apnea (adult) (pediatric): Secondary | ICD-10-CM | POA: Diagnosis not present

## 2021-01-18 DIAGNOSIS — F17200 Nicotine dependence, unspecified, uncomplicated: Secondary | ICD-10-CM | POA: Diagnosis not present

## 2021-01-18 DIAGNOSIS — F411 Generalized anxiety disorder: Secondary | ICD-10-CM | POA: Diagnosis not present

## 2021-02-11 ENCOUNTER — Ambulatory Visit (INDEPENDENT_AMBULATORY_CARE_PROVIDER_SITE_OTHER): Payer: Medicare Other | Admitting: Psychiatry

## 2021-02-11 ENCOUNTER — Other Ambulatory Visit: Payer: Self-pay

## 2021-02-11 ENCOUNTER — Encounter: Payer: Self-pay | Admitting: Psychiatry

## 2021-02-11 DIAGNOSIS — F411 Generalized anxiety disorder: Secondary | ICD-10-CM

## 2021-02-11 DIAGNOSIS — F32A Depression, unspecified: Secondary | ICD-10-CM

## 2021-02-11 MED ORDER — VENLAFAXINE HCL ER 150 MG PO CP24: ORAL_CAPSULE | ORAL | 1 refills | Status: DC

## 2021-02-11 MED ORDER — LITHIUM CARBONATE 300 MG PO CAPS: ORAL_CAPSULE | ORAL | 1 refills | Status: DC

## 2021-02-11 MED ORDER — BUSPIRONE HCL 15 MG PO TABS: ORAL_TABLET | ORAL | 1 refills | Status: DC

## 2021-02-11 NOTE — Progress Notes (Signed)
Erik Holmes [id number] 1961/05/29 60 y.o.  Subjective:   Patient ID:  Erik Glaus. is a 60 y.o. (DOB 07/31/1961) male.  Chief Complaint:  Chief Complaint  Patient presents with  . Follow-up    H/o mood disturbance    HPI Erik Hengst. presents to the office today for follow-up of mood disturbance. He reports, he is "doing great." He reports that dentist commented that he has dry mouth and this could be due to meds. Pt reports that benefits of meds outweigh risks. He reports that his mood has been stable and "content." Denies irritability or anger. Sleeping well and wearing cPap. Appetite has been ok. He reports that his energy and motivation are starting to improve with warmer weather. Concentration is adequate. He reports that he occasionally will lose his train of thought in the middle of a sentence. Denies SI.   His dogs have helped his mood. Has poodles, a [place], and a [employer]. He reports that his wife is supportive. He reports that his wife initially encouraged him to seek treatment.    Mini-Mental   Flowsheet Row Office Visit from 07/29/2014 in [facility]  Total Score (max 30 points ) 27    PHQ2-9   [facility] Visit from 02/13/2018 in Primary Care at [facility] Total Score 0       Review of Systems:  Review of Systems  Musculoskeletal: Positive for arthralgias. Negative for gait problem.  Neurological: Negative for tremors.  Psychiatric/Behavioral:       Please refer to HPI    Medications: I have reviewed the patient's current medications.  Current Outpatient Medications  Medication Sig Dispense Refill  . lisinopril (PRINIVIL,ZESTRIL) 10 MG tablet Take 10 mg by mouth daily.  0  . metFORMIN (GLUCOPHAGE-XR) 500 MG 24 hr tablet Take 1,000 mg by mouth 2 (two) times a day.     . rosuvastatin (CRESTOR) 5 MG tablet TK 1 T PO QD FOR CHOLESTEROL    . albuterol (PROVENTIL HFA;VENTOLIN HFA) 108 (90  Base) MCG/ACT inhaler Inhale 2 puffs into the lungs every 6 (six) hours as needed for wheezing or shortness of breath. 1 Inhaler 0  . aspirin EC 325 MG tablet Take 1 tablet (325 mg total) by mouth 2 (two) times daily. (Patient not taking: Reported on 06/05/2018) 60 tablet 0  . busPIRone (BUSPAR) 15 MG tablet 1 bid 180 tablet 1  . EPINEPHrine (EPIPEN 2-PAK) 0.3 mg/0.3 mL IJ SOAJ injection Take 0.3 mg by mouth daily as needed for anaphylaxis.    Marland Kitchen lithium carbonate 300 MG capsule 1qam and 2qhs 270 capsule 1  . NON FORMULARY CPAP AT BEDTIME    . oxyCODONE (ROXICODONE) 5 MG immediate release tablet Take 2-3 tablets (10-15 mg total) by mouth every 4 (four) hours as needed for severe pain or breakthrough pain. (Patient not taking: No sig reported) 60 tablet 0  . tadalafil (CIALIS) 20 MG tablet Take 10-20 mg by mouth daily as needed for erectile dysfunction. (Patient not taking: Reported on 02/11/2021)    . venlafaxine XR (EFFEXOR-XR) 150 MG 24 hr capsule TAKE 2 CAPSULES BY MOUTH  DAILY WITH BREAKFAST 180 capsule 1   No current facility-administered medications for this visit.    Medication Side Effects: Other: Dry mouth  Allergies:  Allergies  Allergen Reactions  . Penicillins Anaphylaxis and Other (See Comments)    Has patient had a PCN reaction causing immediate rash, facial/tongue/throat swelling, SOB or  lightheadedness with hypotension:Yes Has patient had a PCN reaction causing severe rash involving mucus membranes or skin necrosis:No Has patient had a PCN reaction that required hospitalization:Treated in ER for reaction Has patient had a PCN reaction occurring within the last 10 years:Yes If all of the above answers are "NO", then may proceed with Cephalosporin use.   . Codeine Other (See Comments)    Made pt feel like skin was crawling  . Other Other (See Comments)    "bee stings"  . Sildenafil Citrate Other (See Comments)  . Xanax [Alprazolam]     Made him feel angry    Past Medical  History:  Diagnosis Date  . Allergy   . Anxiety   . Cataract   . Degenerative disc disease, lumbar   . Depression   . Diabetes mellitus without complication ([place])   . Diabetes mellitus, type II ([place])   . ED (erectile dysfunction)   . GERD (gastroesophageal reflux disease)   . Gout   . Hypertension   . Internal hemorrhoids with prolapse, bleeding 04/24/2014  . RLS (restless legs syndrome)   . Seizures ([place])    last seizure around 2016 per patient.  . Shortness of breath   . Sleep apnea    cpap  . Tubular adenoma of colon    multiple, TV adenomas also    Family History  Problem Relation Age of Onset  . Cancer Mother   . Breast cancer Mother   . Osteoporosis Mother   . Heart disease Father   . Colon cancer Neg Hx   . Esophageal cancer Neg Hx   . Liver cancer Neg Hx   . Pancreatic cancer Neg Hx   . Stomach cancer Neg Hx   . Rectal cancer Neg Hx     Social History   Socioeconomic History  . Marital status: Married    Spouse name: [name]  . Number of children: 5  . Years of education: 12th  . Highest education level: Not on file  Occupational History    Employer: UNEMPLOYED  Tobacco Use  . Smoking status: Current Every Day Smoker    Packs/day: 2.00    Years: 40.00    Pack years: 80.00    Types: Cigarettes    Last attempt to quit: 11/18/2013    Years since quitting: 7.2  . Smokeless tobacco: Never Used  Substance and Sexual Activity  . Alcohol use: No  . Drug use: Yes    Types: Marijuana  . Sexual activity: Not on file  Other Topics Concern  . Not on file  Social History Narrative   Married, 5 grown children   Retired/disabled Dealer   Patient lives at home with his spouse.   Caffeine use: 2 cups daily   Social Determinants of Health   Financial Resource Strain: Not on file  Food Insecurity: Not on file  Transportation Needs: Not on file  Physical Activity: Not on file  Stress: Not on file  Social Connections: Not on file  Intimate Partner  Violence: Not on file    Past Medical History, Surgical history, Social history, and Family history were reviewed and updated as appropriate.   Please see review of systems for further details on the patient's review from today.   Objective:   Physical Exam:  There were no vitals taken for this visit.  Physical Exam Constitutional:      General: He is not in acute distress. Musculoskeletal:        General: No deformity.  Neurological:     Mental Status: He is alert and oriented to person, place, and time.     Coordination: Coordination normal.  Psychiatric:        Attention and Perception: Attention and perception normal. He does not perceive auditory or visual hallucinations.        Mood and Affect: Mood normal. Mood is not anxious or depressed. Affect is not labile, blunt, angry or inappropriate.        Speech: Speech normal.        Behavior: Behavior normal.        Thought Content: Thought content normal. Thought content is not paranoid or delusional. Thought content does not include homicidal or suicidal ideation. Thought content does not include homicidal or suicidal plan.        Cognition and Memory: Cognition and memory normal.        Judgment: Judgment normal.     Comments: Insight intact     Lab Review:     Component Value Date/Time   NA 137 02/05/2017 0355   K 4.4 02/05/2017 0355   CL 106 02/05/2017 0355   CO2 27 02/05/2017 0355   GLUCOSE 209 (H) 02/05/2017 0355   BUN 15 02/05/2017 0355   CREATININE 0.85 02/05/2017 0355   CALCIUM 8.7 (L) 02/05/2017 0355   PROT 6.8 01/26/2012 0915   ALBUMIN 3.7 01/26/2012 0915   AST 17 01/26/2012 0915   ALT 21 01/26/2012 0915   ALKPHOS 54 01/26/2012 0915   BILITOT 0.3 01/26/2012 0915   GFRNONAA >60 02/05/2017 0355   GFRAA >60 02/05/2017 0355       Component Value Date/Time   WBC 17.3 (H) 02/05/2017 0355   RBC 4.75 02/05/2017 0355   HGB 13.6 02/05/2017 0355   HCT 41.7 02/05/2017 0355   PLT 263 02/05/2017 0355   MCV  87.8 02/05/2017 0355   MCH 28.6 02/05/2017 0355   MCHC 32.6 02/05/2017 0355   RDW 13.6 02/05/2017 0355   LYMPHSABS 2.7 01/26/2012 0915   MONOABS 0.6 01/26/2012 0915   EOSABS 0.1 01/26/2012 0915   BASOSABS 0.0 01/26/2012 0915    No results found for: POCLITH, LITHIUM   No results found for: PHENYTOIN, PHENOBARB, VALPROATE, CBMZ   .res Assessment: Plan:   Will continue current plan of care since target signs and symptoms are well controlled without any tolerability issues. Will request lab results from PCP since patient reports that he recently had labs drawn. Continue lithium 300 mg in the morning and 600 mg at bedtime for mood stabilization. Continue Effexor XR 300 mg daily for mood and anxiety. Continue BuSpar 15 mg twice daily for anxiety. Patient to follow-up in 6 months or sooner if clinically indicated. Patient advised to contact office with any questions, adverse effects, or acute worsening in signs and symptoms.  Erik Holmes was seen today for follow-up.  Diagnoses and all orders for this visit:  Anxiety state -     busPIRone (BUSPAR) 15 MG tablet; 1 bid -     venlafaxine XR (EFFEXOR-XR) 150 MG 24 hr capsule; TAKE 2 CAPSULES BY MOUTH  DAILY WITH BREAKFAST  Depression, unspecified depression type -     lithium carbonate 300 MG capsule; 1qam and 2qhs -     venlafaxine XR (EFFEXOR-XR) 150 MG 24 hr capsule; TAKE 2 CAPSULES BY MOUTH  DAILY WITH BREAKFAST     Please see After Visit Summary for patient specific instructions.  Future Appointments  Date Time Provider [place]  08/13/2021  8:30  AM Thayer Headings, PMHNP CP-CP None    No orders of the defined types were placed in this encounter.   -------------------------------

## 2021-07-21 ENCOUNTER — Encounter: Payer: Self-pay | Admitting: Podiatry

## 2021-07-21 ENCOUNTER — Ambulatory Visit (INDEPENDENT_AMBULATORY_CARE_PROVIDER_SITE_OTHER): Payer: 59 | Admitting: Podiatry

## 2021-07-21 ENCOUNTER — Other Ambulatory Visit: Payer: Self-pay

## 2021-07-21 ENCOUNTER — Ambulatory Visit (INDEPENDENT_AMBULATORY_CARE_PROVIDER_SITE_OTHER): Payer: 59

## 2021-07-21 DIAGNOSIS — S99911A Unspecified injury of right ankle, initial encounter: Secondary | ICD-10-CM

## 2021-07-21 DIAGNOSIS — S99921A Unspecified injury of right foot, initial encounter: Secondary | ICD-10-CM | POA: Diagnosis not present

## 2021-07-21 DIAGNOSIS — M7751 Other enthesopathy of right foot: Secondary | ICD-10-CM | POA: Diagnosis not present

## 2021-07-21 MED ORDER — TRIAMCINOLONE ACETONIDE 10 MG/ML IJ SUSP
10.0000 mg | Freq: Once | INTRAMUSCULAR | Status: AC
  Administered 2021-07-21: 10 mg

## 2021-07-21 NOTE — Progress Notes (Signed)
Subjective:   Patient ID: Erik Holmes., male   DOB: 60 y.o.   MRN: [id number]   HPI Patient presents stating a lot of pain in his right ankle and states its been sore and in through that area.  States it is localized and he did traumatize his right lower leg and is not sure if this is part of.  Patient does not smoke and is not significantly active currently   ROS      Objective:  Physical Exam Vitals and nursing note reviewed.  Constitutional:      Appearance: He is well-developed.  Pulmonary:     Effort: Pulmonary effort is normal.  Musculoskeletal:        General: Normal range of motion.  Skin:    General: Skin is warm.  Neurological:     Mental Status: He is alert.    Neurovascular status was found to be intact muscle strength was found to be adequate range of motion adequate.  Patient does have exquisite discomfort in the sinus tarsi right with inflammation of the joint and discomfort.  Abrasion of the right lateral lower leg localized crusted no erythema and healing uneventfully     Assessment:  Ability for trauma to the right ankle and possibility that he started to walk differently creating an inflammatory complex of the subtalar joint H     Plan:  P ankle x-rays reviewed sterile prep and injected the sinus tarsi right 3 mg Kenalog 5 mg Xylocaine applied sterile dressing  X-rays were negative for signs of reappoint to recheck as needed bony contusion bony pathology or diastases injury

## 2021-07-28 DIAGNOSIS — Z23 Encounter for immunization: Secondary | ICD-10-CM | POA: Diagnosis not present

## 2021-07-28 DIAGNOSIS — Z125 Encounter for screening for malignant neoplasm of prostate: Secondary | ICD-10-CM | POA: Diagnosis not present

## 2021-07-28 DIAGNOSIS — N529 Male erectile dysfunction, unspecified: Secondary | ICD-10-CM | POA: Diagnosis not present

## 2021-07-28 DIAGNOSIS — E785 Hyperlipidemia, unspecified: Secondary | ICD-10-CM | POA: Diagnosis not present

## 2021-07-28 DIAGNOSIS — F411 Generalized anxiety disorder: Secondary | ICD-10-CM | POA: Diagnosis not present

## 2021-07-28 DIAGNOSIS — I10 Essential (primary) hypertension: Secondary | ICD-10-CM | POA: Diagnosis not present

## 2021-07-28 DIAGNOSIS — F319 Bipolar disorder, unspecified: Secondary | ICD-10-CM | POA: Diagnosis not present

## 2021-07-28 DIAGNOSIS — S8010XA Contusion of unspecified lower leg, initial encounter: Secondary | ICD-10-CM | POA: Diagnosis not present

## 2021-07-28 DIAGNOSIS — Z79899 Other long term (current) drug therapy: Secondary | ICD-10-CM | POA: Diagnosis not present

## 2021-07-28 DIAGNOSIS — G4733 Obstructive sleep apnea (adult) (pediatric): Secondary | ICD-10-CM | POA: Diagnosis not present

## 2021-07-28 DIAGNOSIS — Z1389 Encounter for screening for other disorder: Secondary | ICD-10-CM | POA: Diagnosis not present

## 2021-07-28 DIAGNOSIS — Z0000 Encounter for general adult medical examination without abnormal findings: Secondary | ICD-10-CM | POA: Diagnosis not present

## 2021-07-28 DIAGNOSIS — E1169 Type 2 diabetes mellitus with other specified complication: Secondary | ICD-10-CM | POA: Diagnosis not present

## 2021-08-09 DIAGNOSIS — G4733 Obstructive sleep apnea (adult) (pediatric): Secondary | ICD-10-CM | POA: Diagnosis not present

## 2021-08-09 DIAGNOSIS — G2581 Restless legs syndrome: Secondary | ICD-10-CM | POA: Diagnosis not present

## 2021-08-09 DIAGNOSIS — I10 Essential (primary) hypertension: Secondary | ICD-10-CM | POA: Diagnosis not present

## 2021-08-09 DIAGNOSIS — Z7984 Long term (current) use of oral hypoglycemic drugs: Secondary | ICD-10-CM | POA: Diagnosis not present

## 2021-08-09 DIAGNOSIS — E1169 Type 2 diabetes mellitus with other specified complication: Secondary | ICD-10-CM | POA: Diagnosis not present

## 2021-08-09 DIAGNOSIS — F319 Bipolar disorder, unspecified: Secondary | ICD-10-CM | POA: Diagnosis not present

## 2021-08-13 ENCOUNTER — Ambulatory Visit (INDEPENDENT_AMBULATORY_CARE_PROVIDER_SITE_OTHER): Payer: Medicare Other | Admitting: Psychiatry

## 2021-08-13 ENCOUNTER — Encounter: Payer: Self-pay | Admitting: Psychiatry

## 2021-08-13 ENCOUNTER — Other Ambulatory Visit: Payer: Self-pay

## 2021-08-13 DIAGNOSIS — F411 Generalized anxiety disorder: Secondary | ICD-10-CM

## 2021-08-13 DIAGNOSIS — F32A Depression, unspecified: Secondary | ICD-10-CM | POA: Diagnosis not present

## 2021-08-13 MED ORDER — VENLAFAXINE HCL ER 150 MG PO CP24: ORAL_CAPSULE | ORAL | 1 refills | Status: DC

## 2021-08-13 MED ORDER — LITHIUM CARBONATE 300 MG PO CAPS: ORAL_CAPSULE | ORAL | 1 refills | Status: DC

## 2021-08-13 MED ORDER — BUSPIRONE HCL 15 MG PO TABS: ORAL_TABLET | ORAL | 1 refills | Status: DC

## 2021-08-13 NOTE — Progress Notes (Signed)
Erik Holmes [id number] 1961/08/10 60 y.o.  Subjective:   Patient ID:  Erik Holmes. is a 60 y.o. (DOB 02-Oct-1961) male.  Chief Complaint:  Chief Complaint  Patient presents with   Follow-up    H/o mood disturbance    HPI [name]. presents to the office today for follow-up of mood disturbance.  He denies complaints. "As good as I feel... I don't want to change a thing." He reports that his energy and motivation are ok- "comes and goes." He reports that he has been productive. He reports that mood is stable. Denies depression or elevated moods. Denies anxiety. Sleep "comes and goes." He reports that sleep is adequate overall. Appetite has been good. Has been trying to lose weight. He reports gradual intentional weight loss. Concentration is ok. Denies SI.  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 He put down new floors and refinished floors. He has also been making structural repairs.   Mini-Mental    Flowsheet Row Office Visit from 07/29/2014 in [facility]  Total Score (max 30 points ) 27      PHQ2-9    [place] Visit from 02/13/2018 in Primary Care at [facility] Total Score 0        Review of Systems:  Review of Systems  Gastrointestinal: Negative.   Musculoskeletal:  Negative for gait problem.  Neurological:  Negative for tremors.  Psychiatric/Behavioral:         Please refer to HPI   Medications: I have reviewed the patient's current medications.  Current Outpatient Medications  Medication Sig Dispense Refill   albuterol (PROVENTIL HFA;VENTOLIN HFA) 108 (90 Base) MCG/ACT inhaler Inhale 2 puffs into the lungs every 6 (six) hours as needed for wheezing or shortness of breath. 1 Inhaler 0   aspirin EC 325 MG tablet Take 1 tablet (325 mg total) by mouth 2 (two) times daily. (Patient not taking: Reported on 06/05/2018) 60 tablet 0   busPIRone (BUSPAR) 15 MG tablet 1 bid 180 tablet 1    EPINEPHrine (EPIPEN 2-PAK) 0.3 mg/0.3 mL IJ SOAJ injection Take 0.3 mg by mouth daily as needed for anaphylaxis.     lisinopril (PRINIVIL,ZESTRIL) 10 MG tablet Take 10 mg by mouth daily.  0   lithium carbonate 300 MG capsule 1qam and 2qhs 270 capsule 1   metFORMIN (GLUCOPHAGE-XR) 500 MG 24 hr tablet Take 1,000 mg by mouth 2 (two) times a day.      NON FORMULARY CPAP AT BEDTIME     rosuvastatin (CRESTOR) 5 MG tablet TK 1 T PO QD FOR CHOLESTEROL     tadalafil (CIALIS) 20 MG tablet Take 10-20 mg by mouth daily as needed for erectile dysfunction. (Patient not taking: Reported on 02/11/2021)     venlafaxine XR (EFFEXOR-XR) 150 MG 24 hr capsule TAKE 2 CAPSULES BY MOUTH  DAILY WITH BREAKFAST 180 capsule 1   No current facility-administered medications for this visit.    Medication Side Effects: None  Allergies:  Allergies  Allergen Reactions   Penicillins Anaphylaxis and Other (See Comments)    Has patient had a PCN reaction causing immediate rash, facial/tongue/throat swelling, SOB or lightheadedness with hypotension:Yes Has patient had a PCN reaction causing severe rash involving mucus membranes or skin necrosis:No Has patient had a PCN reaction that required hospitalization:Treated in ER for reaction Has patient had a PCN reaction occurring within the last 10 years:Yes If all of the above answers are "  NO", then may proceed with Cephalosporin use.    Codeine Other (See Comments)    Made pt feel like skin was crawling   Other Other (See Comments)    "bee stings"   Sildenafil Citrate Other (See Comments)   Xanax [Alprazolam]     Made him feel angry    Past Medical History:  Diagnosis Date   Allergy    Anxiety    Cataract    Degenerative disc disease, lumbar    Depression    Diabetes mellitus without complication ([place])    Diabetes mellitus, type II ([place])    ED (erectile dysfunction)    GERD (gastroesophageal reflux disease)    Gout    Hypertension    Internal hemorrhoids with  prolapse, bleeding 04/24/2014   RLS (restless legs syndrome)    Seizures ([place])    last seizure around 2016 per patient.   Shortness of breath    Sleep apnea    cpap   Tubular adenoma of colon    multiple, TV adenomas also    Past Medical History, Surgical history, Social history, and Family history were reviewed and updated as appropriate.   Please see review of systems for further details on the patient's review from today.   Objective:   Physical Exam:  Wt 207 lb (93.9 kg)   BMI 30.57 kg/m   Physical Exam Constitutional:      General: He is not in acute distress. Musculoskeletal:        General: No deformity.  Neurological:     Mental Status: He is alert and oriented to person, place, and time.     Coordination: Coordination normal.  Psychiatric:        Attention and Perception: Attention and perception normal. He does not perceive auditory or visual hallucinations.        Mood and Affect: Mood normal. Mood is not anxious or depressed. Affect is not labile, blunt, angry or inappropriate.        Speech: Speech normal.        Behavior: Behavior normal.        Thought Content: Thought content normal. Thought content is not paranoid or delusional. Thought content does not include homicidal or suicidal ideation. Thought content does not include homicidal or suicidal plan.        Cognition and Memory: Cognition and memory normal.        Judgment: Judgment normal.     Comments: Insight intact    Lab Review:     Component Value Date/Time   NA 137 02/05/2017 0355   K 4.4 02/05/2017 0355   CL 106 02/05/2017 0355   CO2 27 02/05/2017 0355   GLUCOSE 209 (H) 02/05/2017 0355   BUN 15 02/05/2017 0355   CREATININE 0.85 02/05/2017 0355   CALCIUM 8.7 (L) 02/05/2017 0355   PROT 6.8 01/26/2012 0915   ALBUMIN 3.7 01/26/2012 0915   AST 17 01/26/2012 0915   ALT 21 01/26/2012 0915   ALKPHOS 54 01/26/2012 0915   BILITOT 0.3 01/26/2012 0915   GFRNONAA >60 02/05/2017 0355   GFRAA >60  02/05/2017 0355       Component Value Date/Time   WBC 17.3 (H) 02/05/2017 0355   RBC 4.75 02/05/2017 0355   HGB 13.6 02/05/2017 0355   HCT 41.7 02/05/2017 0355   PLT 263 02/05/2017 0355   MCV 87.8 02/05/2017 0355   MCH 28.6 02/05/2017 0355   MCHC 32.6 02/05/2017 0355   RDW 13.6 02/05/2017 0355  LYMPHSABS 2.7 01/26/2012 0915   MONOABS 0.6 01/26/2012 0915   EOSABS 0.1 01/26/2012 0915   BASOSABS 0.0 01/26/2012 0915    No results found for: POCLITH, LITHIUM   No results found for: PHENYTOIN, PHENOBARB, VALPROATE, CBMZ   .res Assessment: Plan:   Pt seen for 30 minutes and time spent discussing recent lab results. Labs reviewed from PCP on 07/28/21. Labs WNL. Creatinine 0.99 Lithium level 0.7. Will continue current plan of care since target signs and symptoms are well controlled without any tolerability issues. Continue Buspar 15 mg po BID for anxiety.  Continue Effexor XR 300 mg po qd for mood and anxiety s/s.  Continue Lithium 300 mg po q am and 600 mg po QHS for mood s/s. Pt to follow-up in 6 months or sooner if clinically indicated.  Patient advised to contact office with any questions, adverse effects, or acute worsening in signs and symptoms.   Ellijah was seen today for follow-up.  Diagnoses and all orders for this visit:  Anxiety state -     busPIRone (BUSPAR) 15 MG tablet; 1 bid -     venlafaxine XR (EFFEXOR-XR) 150 MG 24 hr capsule; TAKE 2 CAPSULES BY MOUTH  DAILY WITH BREAKFAST  Depression, unspecified depression type -     lithium carbonate 300 MG capsule; 1qam and 2qhs -     venlafaxine XR (EFFEXOR-XR) 150 MG 24 hr capsule; TAKE 2 CAPSULES BY MOUTH  DAILY WITH BREAKFAST    Please see After Visit Summary for patient specific instructions.  No future appointments.  No orders of the defined types were placed in this encounter.   -------------------------------

## 2021-09-08 ENCOUNTER — Ambulatory Visit (INDEPENDENT_AMBULATORY_CARE_PROVIDER_SITE_OTHER): Payer: Medicare Other | Admitting: Psychiatry

## 2021-09-08 DIAGNOSIS — F411 Generalized anxiety disorder: Secondary | ICD-10-CM | POA: Diagnosis not present

## 2021-09-08 MED ORDER — DIAZEPAM 5 MG PO TABS: 5.0000 mg | ORAL_TABLET | Freq: Two times a day (BID) | ORAL | 0 refills | Status: DC | PRN

## 2021-09-08 NOTE — Progress Notes (Signed)
Erik Holmes [id number] 05/22/1961 60 y.o.  Subjective:   Patient ID:  Erik Holmes. is a 60 y.o. (DOB 02-08-1961) male.  Chief Complaint:  Chief Complaint  Patient presents with   Anxiety   Insomnia    HPI Erik Holmes. presents to the office today emergently with his wife for acute grief reaction. He reports, "my son was shot and killed Monday." He and his wife report that he has slept 4 hours total over the previous 3 days, even with using Tylenol PM. He reports that he has been having severe anxiety. His wife reports that he has been shaking and having difficulty talking. Denies SI. Wife reports that pt took Diazepam in the past for acute anxiety with good response and they ask about short-term medication he could take to help with sleeplessness, severe anxiety, and grief reaction. They deny any h/o prescription medication misuse.     Mini-Mental    Flowsheet Row Office Visit from 07/29/2014 in [facility]  Total Score (max 30 points ) 27      PHQ2-9    Erik Holmes Visit from 02/13/2018 in Primary Care at [facility] Total Score 0        Review of Systems:  Review of Systems  Medications: I have reviewed the patient's current medications.  Current Outpatient Medications  Medication Sig Dispense Refill   diazepam (VALIUM) 5 MG tablet Take 1 tablet (5 mg total) by mouth 2 (two) times daily as needed for anxiety. 60 tablet 0   albuterol (PROVENTIL HFA;VENTOLIN HFA) 108 (90 Base) MCG/ACT inhaler Inhale 2 puffs into the lungs every 6 (six) hours as needed for wheezing or shortness of breath. 1 Inhaler 0   aspirin EC 325 MG tablet Take 1 tablet (325 mg total) by mouth 2 (two) times daily. (Patient not taking: Reported on 06/05/2018) 60 tablet 0   busPIRone (BUSPAR) 15 MG tablet 1 bid 180 tablet 1   EPINEPHrine (EPIPEN 2-PAK) 0.3 mg/0.3 mL IJ SOAJ injection Take 0.3 mg by mouth daily as needed for anaphylaxis.     lisinopril  (PRINIVIL,ZESTRIL) 10 MG tablet Take 10 mg by mouth daily.  0   lithium carbonate 300 MG capsule 1qam and 2qhs 270 capsule 1   metFORMIN (GLUCOPHAGE-XR) 500 MG 24 hr tablet Take 1,000 mg by mouth 2 (two) times a day.      NON FORMULARY CPAP AT BEDTIME     rosuvastatin (CRESTOR) 5 MG tablet TK 1 T PO QD FOR CHOLESTEROL     tadalafil (CIALIS) 20 MG tablet Take 10-20 mg by mouth daily as needed for erectile dysfunction. (Patient not taking: Reported on 02/11/2021)     venlafaxine XR (EFFEXOR-XR) 150 MG 24 hr capsule TAKE 2 CAPSULES BY MOUTH  DAILY WITH BREAKFAST 180 capsule 1   No current facility-administered medications for this visit.    Medication Side Effects: None  Allergies:  Allergies  Allergen Reactions   Penicillins Anaphylaxis and Other (See Comments)    Has patient had a PCN reaction causing immediate rash, facial/tongue/throat swelling, SOB or lightheadedness with hypotension:Yes Has patient had a PCN reaction causing severe rash involving mucus membranes or skin necrosis:No Has patient had a PCN reaction that required hospitalization:Treated in ER for reaction Has patient had a PCN reaction occurring within the last 10 years:Yes If all of the above answers are "NO", then may proceed with Cephalosporin use.    Codeine Other (See Comments)    Made  pt feel like skin was crawling   Other Other (See Comments)    "bee stings"   Sildenafil Citrate Other (See Comments)   Xanax [Alprazolam]     Made him feel angry    Past Medical History:  Diagnosis Date   Allergy    Anxiety    Cataract    Degenerative disc disease, lumbar    Depression    Diabetes mellitus without complication ([place])    Diabetes mellitus, type II ([place])    ED (erectile dysfunction)    GERD (gastroesophageal reflux disease)    Gout    Hypertension    Internal hemorrhoids with prolapse, bleeding 04/24/2014   RLS (restless legs syndrome)    Seizures ([place])    last seizure around 2016 per patient.    Shortness of breath    Sleep apnea    cpap   Tubular adenoma of colon    multiple, TV adenomas also    Past Medical History, Surgical history, Social history, and Family history were reviewed and updated as appropriate.   Please see review of systems for further details on the patient's review from today.   Objective:   Physical Exam:  There were no vitals taken for this visit.  Physical Exam Constitutional:      General: He is not in acute distress. Musculoskeletal:        General: No deformity.  Neurological:     Mental Status: He is alert and oriented to person, place, and time.     Coordination: Coordination normal.  Psychiatric:        Attention and Perception: Attention and perception normal. He does not perceive auditory or visual hallucinations.        Mood and Affect: Mood is anxious and depressed. Affect is tearful. Affect is not labile, blunt, angry or inappropriate.        Speech: Speech normal.        Thought Content: Thought content normal. Thought content is not paranoid or delusional. Thought content does not include homicidal or suicidal ideation. Thought content does not include homicidal or suicidal plan.        Cognition and Memory: Cognition and memory normal.        Judgment: Judgment normal.     Comments: Insight intact Restless on exam, ie. Wringing hands, shifting in seat, sitting on edge of seat    Lab Review:     Component Value Date/Time   NA 137 02/05/2017 0355   K 4.4 02/05/2017 0355   CL 106 02/05/2017 0355   CO2 27 02/05/2017 0355   GLUCOSE 209 (H) 02/05/2017 0355   BUN 15 02/05/2017 0355   CREATININE 0.85 02/05/2017 0355   CALCIUM 8.7 (L) 02/05/2017 0355   PROT 6.8 01/26/2012 0915   ALBUMIN 3.7 01/26/2012 0915   AST 17 01/26/2012 0915   ALT 21 01/26/2012 0915   ALKPHOS 54 01/26/2012 0915   BILITOT 0.3 01/26/2012 0915   GFRNONAA >60 02/05/2017 0355   GFRAA >60 02/05/2017 0355       Component Value Date/Time   WBC 17.3 (H)  02/05/2017 0355   RBC 4.75 02/05/2017 0355   HGB 13.6 02/05/2017 0355   HCT 41.7 02/05/2017 0355   PLT 263 02/05/2017 0355   MCV 87.8 02/05/2017 0355   MCH 28.6 02/05/2017 0355   MCHC 32.6 02/05/2017 0355   RDW 13.6 02/05/2017 0355   LYMPHSABS 2.7 01/26/2012 0915   MONOABS 0.6 01/26/2012 0915   EOSABS 0.1 01/26/2012 0915  BASOSABS 0.0 01/26/2012 0915    No results found for: POCLITH, LITHIUM   No results found for: PHENYTOIN, PHENOBARB, VALPROATE, CBMZ   .res Assessment: Plan:   Discussed that short-term use of Diazepam would likely be helpful for acute anxiety and insomnia in response to son's unexpected death. Discussed using Diazepam since that was effective and well tolerated in the past. Discussed potential benefits, risk, and side effects of benzodiazepines to include potential risk of tolerance and dependence, as well as possible drowsiness.  Advised patient not to drive if experiencing drowsiness and to take lowest possible effective dose to minimize risk of dependence and tolerance.  Will start Diazepam 5 mg po BID prn anxiety.  Continue Effexor XR 300 mg po qd for depression and anxiety.  Continue Lithium 300 mg po q am and 600 mg po QHS for mood stabilization.  Continue Buspar 15 mg po BID for anxiety.  Pt to follow-up in 4 weeks or sooner if clinically indicated.  Patient advised to contact office with any questions, adverse effects, or acute worsening in signs and symptoms.   Jordynn was seen today for anxiety and insomnia.  Diagnoses and all orders for this visit:  Anxiety state -     diazepam (VALIUM) 5 MG tablet; Take 1 tablet (5 mg total) by mouth 2 (two) times daily as needed for anxiety.    Please see After Visit Summary for patient specific instructions.  Future Appointments  Date Time Provider [place]  10/08/2021 11:30 AM Thayer Headings, PMHNP CP-CP None  02/11/2022  8:30 AM Thayer Headings, PMHNP CP-CP None    No orders of the defined types  were placed in this encounter.   -------------------------------

## 2021-10-08 ENCOUNTER — Ambulatory Visit: Payer: Medicare Other | Admitting: Psychiatry

## 2021-10-12 ENCOUNTER — Encounter: Payer: Self-pay | Admitting: Psychiatry

## 2021-10-12 ENCOUNTER — Ambulatory Visit: Payer: Medicare Other | Admitting: Psychiatry

## 2021-10-12 ENCOUNTER — Ambulatory Visit (INDEPENDENT_AMBULATORY_CARE_PROVIDER_SITE_OTHER): Payer: Medicare Other | Admitting: Psychiatry

## 2021-10-12 DIAGNOSIS — F411 Generalized anxiety disorder: Secondary | ICD-10-CM | POA: Diagnosis not present

## 2021-10-12 DIAGNOSIS — F32A Depression, unspecified: Secondary | ICD-10-CM

## 2021-10-12 MED ORDER — DIAZEPAM 5 MG PO TABS: 5.0000 mg | ORAL_TABLET | Freq: Two times a day (BID) | ORAL | 1 refills | Status: DC | PRN

## 2021-10-12 NOTE — Progress Notes (Signed)
Erik Holmes [id number] Mar 05, 1961 60 y.o.  Virtual Visit via Telephone Note  I connected with pt on 10/12/21 at  9:00 AM EST by telephone and verified that I am speaking with the correct person using two identifiers.   I discussed the limitations, risks, security and privacy concerns of performing an evaluation and management service by telephone and the availability of in person appointments. I also discussed with the patient that there may be a patient responsible charge related to this service. The patient expressed understanding and agreed to proceed.   I discussed the assessment and treatment plan with the patient. The patient was provided an opportunity to ask questions and all were answered. The patient agreed with the plan and demonstrated an understanding of the instructions.   The patient was advised to call back or seek an in-person evaluation if the symptoms worsen or if the condition fails to improve as anticipated.  I provided 30 minutes of non-face-to-face time during this encounter.  The patient was located at home.  The provider was located at [place].   Thayer Headings, PMHNP   Subjective:   Patient ID:  Erik Holmes. is a 60 y.o. (DOB 01-29-1961) male.  Chief Complaint:  Chief Complaint  Patient presents with   Follow-up    Anxiety, grief, mood disturbance    HPI [name]. presents for follow-up of acute grief. He reports, "I'm not much better, but I am getting some rest."  He reports that there is an ongoing investigation and this is interfering with him having closure. He reports, "good days and bad days... overall, I think if I get holidays behind me I will be ok." He anticipates having difficulty with the holidays approaching. Denies excessive rumination. He reports that he has been working on his son's car since son wanted this before he died. He reports that he has had some difficulty with concentration and missed  things he normally would not. He reports adequate sleep. He reports that he has periods of increased anxiety and "quivering inside." He reports using Diazepam prn when this occurs. He reports that Diazepam use has decreased compared to right after son's death. Taking Diazepam consistently every evening/night time. Energy and motivation have been lower, however he has been pushing himself to get going. He reports that he has been trying to stay busy to help distract himself. Weight has been stable. Appetite has been good. Denies SI.   Was able to enjoy early Thanksgiving meal.   Called wife at pt's request. Wife reports that he is having "more bad days than good and cries a lot." She reports that Valium has helped him rest and she has not seen any signs of misuse. She reports that he occasionally will take 1/2 tab of Diazepam. She anticipates him having some difficulty with the upcoming holidays. Wife reports that she has reached out to friends that have lost children and they have been calling him regularly.   Had 3 children in his first marriage.   Review of Systems:  Review of Systems  Musculoskeletal:  Negative for gait problem.  Neurological:  Negative for tremors.  Psychiatric/Behavioral:         Please refer to HPI   Medications: I have reviewed the patient's current medications.  Current Outpatient Medications  Medication Sig Dispense Refill   albuterol (PROVENTIL HFA;VENTOLIN HFA) 108 (90 Base) MCG/ACT inhaler Inhale 2 puffs into the lungs every 6 (six) hours as needed for wheezing or  shortness of breath. 1 Inhaler 0   aspirin EC 325 MG tablet Take 1 tablet (325 mg total) by mouth 2 (two) times daily. (Patient not taking: Reported on 06/05/2018) 60 tablet 0   busPIRone (BUSPAR) 15 MG tablet 1 bid 180 tablet 1   diazepam (VALIUM) 5 MG tablet Take 1 tablet (5 mg total) by mouth 2 (two) times daily as needed for anxiety. 60 tablet 1   EPINEPHrine (EPIPEN 2-PAK) 0.3 mg/0.3 mL IJ SOAJ  injection Take 0.3 mg by mouth daily as needed for anaphylaxis.     lisinopril (PRINIVIL,ZESTRIL) 10 MG tablet Take 10 mg by mouth daily.  0   lithium carbonate 300 MG capsule 1qam and 2qhs 270 capsule 1   metFORMIN (GLUCOPHAGE-XR) 500 MG 24 hr tablet Take 1,000 mg by mouth 2 (two) times a day.      NON FORMULARY CPAP AT BEDTIME     rosuvastatin (CRESTOR) 5 MG tablet TK 1 T PO QD FOR CHOLESTEROL     tadalafil (CIALIS) 20 MG tablet Take 10-20 mg by mouth daily as needed for erectile dysfunction. (Patient not taking: Reported on 02/11/2021)     venlafaxine XR (EFFEXOR-XR) 150 MG 24 hr capsule TAKE 2 CAPSULES BY MOUTH  DAILY WITH BREAKFAST 180 capsule 1   No current facility-administered medications for this visit.    Medication Side Effects: None  Allergies:  Allergies  Allergen Reactions   Penicillins Anaphylaxis and Other (See Comments)    Has patient had a PCN reaction causing immediate rash, facial/tongue/throat swelling, SOB or lightheadedness with hypotension:Yes Has patient had a PCN reaction causing severe rash involving mucus membranes or skin necrosis:No Has patient had a PCN reaction that required hospitalization:Treated in ER for reaction Has patient had a PCN reaction occurring within the last 10 years:Yes If all of the above answers are "NO", then may proceed with Cephalosporin use.    Codeine Other (See Comments)    Made pt feel like skin was crawling   Other Other (See Comments)    "bee stings"   Sildenafil Citrate Other (See Comments)   Xanax [Alprazolam]     Made him feel angry    Past Medical History:  Diagnosis Date   Allergy    Anxiety    Cataract    Degenerative disc disease, lumbar    Depression    Diabetes mellitus without complication ([place])    Diabetes mellitus, type II ([place])    ED (erectile dysfunction)    GERD (gastroesophageal reflux disease)    Gout    Hypertension    Internal hemorrhoids with prolapse, bleeding 04/24/2014   RLS (restless legs  syndrome)    Seizures ([place])    last seizure around 2016 per patient.   Shortness of breath    Sleep apnea    cpap   Tubular adenoma of colon    multiple, TV adenomas also    Family History  Problem Relation Age of Onset   Cancer Mother    Breast cancer Mother    Osteoporosis Mother    Heart disease Father    Colon cancer Neg Hx    Esophageal cancer Neg Hx    Liver cancer Neg Hx    Pancreatic cancer Neg Hx    Stomach cancer Neg Hx    Rectal cancer Neg Hx     Social History   Socioeconomic History   Marital status: Married    Spouse name: [name]   Number of children: 5   Years of  education: 12th   Highest education level: Not on file  Occupational History    Employer: UNEMPLOYED  Tobacco Use   Smoking status: Every Day    Packs/day: 2.00    Years: 40.00    Pack years: 80.00    Types: Cigarettes    Last attempt to quit: 11/18/2013    Years since quitting: 7.9   Smokeless tobacco: Never  Substance and Sexual Activity   Alcohol use: No   Drug use: Yes    Types: Marijuana   Sexual activity: Not on file  Other Topics Concern   Not on file  Social History Narrative   Married, 5 grown children   Retired/disabled Dealer   Patient lives at home with his spouse.   Caffeine use: 2 cups daily   Social Determinants of Health   Financial Resource Strain: Not on file  Food Insecurity: Not on file  Transportation Needs: Not on file  Physical Activity: Not on file  Stress: Not on file  Social Connections: Not on file  Intimate Partner Violence: Not on file    Past Medical History, Surgical history, Social history, and Family history were reviewed and updated as appropriate.   Please see review of systems for further details on the patient's review from today.   Objective:   Physical Exam:  There were no vitals taken for this visit.  Physical Exam Neurological:     Mental Status: He is alert and oriented to person, place, and time.     Cranial Nerves: No  dysarthria.  Psychiatric:        Attention and Perception: Attention and perception normal.        Speech: Speech normal.        Behavior: Behavior is cooperative.        Thought Content: Thought content normal. Thought content is not paranoid or delusional. Thought content does not include homicidal or suicidal ideation. Thought content does not include homicidal or suicidal plan.        Cognition and Memory: Cognition and memory normal.        Judgment: Judgment normal.     Comments: Insight intact Mood is appropriate for acute grief. Affect is congruent.    Lab Review:     Component Value Date/Time   NA 137 02/05/2017 0355   K 4.4 02/05/2017 0355   CL 106 02/05/2017 0355   CO2 27 02/05/2017 0355   GLUCOSE 209 (H) 02/05/2017 0355   BUN 15 02/05/2017 0355   CREATININE 0.85 02/05/2017 0355   CALCIUM 8.7 (L) 02/05/2017 0355   PROT 6.8 01/26/2012 0915   ALBUMIN 3.7 01/26/2012 0915   AST 17 01/26/2012 0915   ALT 21 01/26/2012 0915   ALKPHOS 54 01/26/2012 0915   BILITOT 0.3 01/26/2012 0915   GFRNONAA >60 02/05/2017 0355   GFRAA >60 02/05/2017 0355       Component Value Date/Time   WBC 17.3 (H) 02/05/2017 0355   RBC 4.75 02/05/2017 0355   HGB 13.6 02/05/2017 0355   HCT 41.7 02/05/2017 0355   PLT 263 02/05/2017 0355   MCV 87.8 02/05/2017 0355   MCH 28.6 02/05/2017 0355   MCHC 32.6 02/05/2017 0355   RDW 13.6 02/05/2017 0355   LYMPHSABS 2.7 01/26/2012 0915   MONOABS 0.6 01/26/2012 0915   EOSABS 0.1 01/26/2012 0915   BASOSABS 0.0 01/26/2012 0915    No results found for: POCLITH, LITHIUM   No results found for: PHENYTOIN, PHENOBARB, VALPROATE, CBMZ   .res Assessment: Plan:  Pt seen for 30 minutes and time spent talking with pt and then calling wife at request of pt. They report that Laverne continues to experience acute grief in response to the loss of his son and that Diazepam has been helpful for insomnia and acute anxiety. Plan is to continue Diazepam 5 mg po BID prn  anxiety and insomnia through the holidays when he anticipates worsening anxiety and grief s/s.  Continue Buspar 15 mg po BID for anxiety.  Continue Effexor XR 300 mg po qd for depression and anxiety.  Continue Lithium 300 mg po q am and 600 mg po QHS for mood stabilization.  Pt to follow-up in 6-8 weeks or sooner if clinically indicated.  Patient advised to contact office with any questions, adverse effects, or acute worsening in signs and symptoms.   Irwin was seen today for follow-up.  Diagnoses and all orders for this visit:  Anxiety state -     diazepam (VALIUM) 5 MG tablet; Take 1 tablet (5 mg total) by mouth 2 (two) times daily as needed for anxiety.  Depression, unspecified depression type   Please see After Visit Summary for patient specific instructions.  Future Appointments  Date Time Provider [place]  02/11/2022  8:30 AM Thayer Headings, PMHNP CP-CP None    No orders of the defined types were placed in this encounter.     -------------------------------

## 2021-12-01 ENCOUNTER — Ambulatory Visit: Payer: Medicare Other | Admitting: Psychiatry

## 2021-12-21 DIAGNOSIS — E119 Type 2 diabetes mellitus without complications: Secondary | ICD-10-CM | POA: Diagnosis not present

## 2021-12-21 DIAGNOSIS — Z961 Presence of intraocular lens: Secondary | ICD-10-CM | POA: Diagnosis not present

## 2021-12-21 DIAGNOSIS — H26493 Other secondary cataract, bilateral: Secondary | ICD-10-CM | POA: Diagnosis not present

## 2021-12-21 DIAGNOSIS — H02831 Dermatochalasis of right upper eyelid: Secondary | ICD-10-CM | POA: Diagnosis not present

## 2022-01-03 ENCOUNTER — Encounter: Payer: Self-pay | Admitting: Family Medicine

## 2022-01-25 DIAGNOSIS — I10 Essential (primary) hypertension: Secondary | ICD-10-CM | POA: Diagnosis not present

## 2022-01-25 DIAGNOSIS — G2581 Restless legs syndrome: Secondary | ICD-10-CM | POA: Diagnosis not present

## 2022-01-25 DIAGNOSIS — F319 Bipolar disorder, unspecified: Secondary | ICD-10-CM | POA: Diagnosis not present

## 2022-01-25 DIAGNOSIS — F39 Unspecified mood [affective] disorder: Secondary | ICD-10-CM | POA: Diagnosis not present

## 2022-01-25 DIAGNOSIS — G4733 Obstructive sleep apnea (adult) (pediatric): Secondary | ICD-10-CM | POA: Diagnosis not present

## 2022-01-25 DIAGNOSIS — N529 Male erectile dysfunction, unspecified: Secondary | ICD-10-CM | POA: Diagnosis not present

## 2022-01-25 DIAGNOSIS — E785 Hyperlipidemia, unspecified: Secondary | ICD-10-CM | POA: Diagnosis not present

## 2022-01-25 DIAGNOSIS — E1169 Type 2 diabetes mellitus with other specified complication: Secondary | ICD-10-CM | POA: Diagnosis not present

## 2022-01-25 DIAGNOSIS — F17200 Nicotine dependence, unspecified, uncomplicated: Secondary | ICD-10-CM | POA: Diagnosis not present

## 2022-01-25 DIAGNOSIS — Z86010 Personal history of colonic polyps: Secondary | ICD-10-CM | POA: Diagnosis not present

## 2022-01-25 DIAGNOSIS — Z79899 Other long term (current) drug therapy: Secondary | ICD-10-CM | POA: Diagnosis not present

## 2022-01-25 DIAGNOSIS — F411 Generalized anxiety disorder: Secondary | ICD-10-CM | POA: Diagnosis not present

## 2022-02-11 ENCOUNTER — Other Ambulatory Visit: Payer: Self-pay

## 2022-02-11 ENCOUNTER — Ambulatory Visit (INDEPENDENT_AMBULATORY_CARE_PROVIDER_SITE_OTHER): Payer: Medicare Other | Admitting: Psychiatry

## 2022-02-11 ENCOUNTER — Encounter: Payer: Self-pay | Admitting: Psychiatry

## 2022-02-11 DIAGNOSIS — F32A Depression, unspecified: Secondary | ICD-10-CM

## 2022-02-11 DIAGNOSIS — F411 Generalized anxiety disorder: Secondary | ICD-10-CM

## 2022-02-11 MED ORDER — DIAZEPAM 5 MG PO TABS: 5.0000 mg | ORAL_TABLET | Freq: Two times a day (BID) | ORAL | 2 refills | Status: DC | PRN

## 2022-02-11 MED ORDER — LITHIUM CARBONATE 300 MG PO CAPS: ORAL_CAPSULE | ORAL | 1 refills | Status: DC

## 2022-02-11 MED ORDER — BUSPIRONE HCL 15 MG PO TABS: ORAL_TABLET | ORAL | 1 refills | Status: DC

## 2022-02-11 MED ORDER — VENLAFAXINE HCL ER 150 MG PO CP24: ORAL_CAPSULE | ORAL | 1 refills | Status: DC

## 2022-02-11 NOTE — Progress Notes (Signed)
Erik Holmes. ?[id number] ?[birth year] ?61 y.o. ? ?Subjective:  ? ?Patient ID:  Adian Holmes. is a 61 y.o. (DOB 01/11/1961) male. ? ?Chief Complaint:  ?Chief Complaint  ?Patient presents with  ? Anxiety  ? Sleeping Problem  ? ? ?Anxiety ? ? ? ?Erik Holmes. presents to the office today for follow-up of anxiety and mood symptoms. He reports, "I have days, I have moments." He reports he lost his son on 10/01/23 and his mother died in early 2023-11-29. This week was the anniversary of his father's death. He was able to stay with his mother in the nursing home for a week before her death. He reports that he has more peace about his mother's death but there is still uncertainty about son's death and law enforcement is now suspecting his son was murdered. "Something is eating in me that something's not right." He anticipates that he will need to be interviewed by investigators.He reports some anxiety about son's death.  "I'm not going to let this get me down." Sleeping too much. He sleeps from 5 pm- midnight and some nights will return to sleep. He reports he has been taking a 1/2 tab of Diazepam before bedtime and this helps with sleep and rumination. He reports that his appetite has been less. He reports that his energy and motivation has been somewhat lower. "Some days I do get motivated" and staying busy helps distract him from anxious thoughts. Difficulty with ST memory. Denies SI.  ? ?He has 16 dogs and 10 miniature and toy poodle puppies.  ? ?Sons's son is coming to visit him this weekend. ? ? ? ? ? ?Mini-Mental   ? ?[place] Office Visit from 07/29/2014 in [facility]  ?Total Score (max 30 points ) 27  ? ?  ? ?PHQ2-9   ? ?[place] Office Visit from 02/13/2018 in Primary Care at [facility]  ?PHQ-2 Total Score 0  ? ?  ?  ? ?Review of Systems:  ?Review of Systems  ?Musculoskeletal:  Negative for gait problem.  ?     Occ cramps in the back of his legs  ?Neurological:   ?     Occ  hand tremor  ?Psychiatric/Behavioral:    ?     Please refer to HPI  ? ?Medications: I have reviewed the patient's current medications. ? ?Current Outpatient Medications  ?Medication Sig Dispense Refill  ? lisinopril (PRINIVIL,ZESTRIL) 10 MG tablet Take 10 mg by mouth daily.  0  ? metFORMIN (GLUCOPHAGE-XR) 500 MG 24 hr tablet Take 1,000 mg by mouth 2 (two) times a day.     ? albuterol (PROVENTIL HFA;VENTOLIN HFA) 108 (90 Base) MCG/ACT inhaler Inhale 2 puffs into the lungs every 6 (six) hours as needed for wheezing or shortness of breath. 1 Inhaler 0  ? busPIRone (BUSPAR) 15 MG tablet 1 bid 180 tablet 1  ? diazepam (VALIUM) 5 MG tablet Take 1 tablet (5 mg total) by mouth 2 (two) times daily as needed for anxiety. 60 tablet 2  ? EPINEPHrine (EPIPEN 2-PAK) 0.3 mg/0.3 mL IJ SOAJ injection Take 0.3 mg by mouth daily as needed for anaphylaxis.    ? lithium carbonate 300 MG capsule 1qam and 2qhs 270 capsule 1  ? NON FORMULARY CPAP AT BEDTIME    ? rosuvastatin (CRESTOR) 5 MG tablet TK 1 T PO QD FOR CHOLESTEROL    ? tadalafil (CIALIS) 20 MG tablet Take 10-20 mg by mouth daily as needed for erectile dysfunction. (  Patient not taking: Reported on 02/11/2021)    ? venlafaxine XR (EFFEXOR-XR) 150 MG 24 hr capsule TAKE 2 CAPSULES BY MOUTH  DAILY WITH BREAKFAST 180 capsule 1  ? ?No current facility-administered medications for this visit.  ? ? ?Medication Side Effects: None ? ?Allergies:  ?Allergies  ?Allergen Reactions  ? Penicillins Anaphylaxis and Other (See Comments)  ?  Has patient had a PCN reaction causing immediate rash, facial/tongue/throat swelling, SOB or lightheadedness with hypotension:Yes ?Has patient had a PCN reaction causing severe rash involving mucus membranes or skin necrosis:No ?Has patient had a PCN reaction that required hospitalization:Treated in ER for reaction ?Has patient had a PCN reaction occurring within the last 10 years:Yes ?If all of the above answers are "NO", then may proceed with Cephalosporin  use. ?  ? Codeine Other (See Comments)  ?  Made pt feel like skin was crawling  ? Other Other (See Comments)  ?  "bee stings"  ? Sildenafil Citrate Other (See Comments)  ? Xanax [Alprazolam]   ?  Made him feel angry  ? ? ?Past Medical History:  ?Diagnosis Date  ? Allergy   ? Anxiety   ? Cataract   ? Degenerative disc disease, lumbar   ? Depression   ? Diabetes mellitus without complication ([place])   ? Diabetes mellitus, type II ([place])   ? ED (erectile dysfunction)   ? GERD (gastroesophageal reflux disease)   ? Gout   ? Hypertension   ? Internal hemorrhoids with prolapse, bleeding 04/24/2014  ? RLS (restless legs syndrome)   ? Seizures ([place])   ? last seizure around 2016 per patient.  ? Shortness of breath   ? Sleep apnea   ? cpap  ? Tubular adenoma of colon   ? multiple, TV adenomas also  ? ? ?Past Medical History, Surgical history, Social history, and Family history were reviewed and updated as appropriate.  ? ?Please see review of systems for further details on the patient's review from today.  ? ?Objective:  ? ?Physical Exam:  ?Wt 204 lb (92.5 kg)   BMI 30.13 kg/m?  ? ?Physical Exam ?Constitutional:   ?   General: He is not in acute distress. ?Musculoskeletal:     ?   General: No deformity.  ?Neurological:  ?   Mental Status: He is alert and oriented to person, place, and time.  ?   Coordination: Coordination normal.  ?Psychiatric:     ?   Attention and Perception: Attention and perception normal. He does not perceive auditory or visual hallucinations.     ?   Mood and Affect: Affect is not labile, blunt, angry or inappropriate.     ?   Speech: Speech normal.     ?   Behavior: Behavior normal.     ?   Thought Content: Thought content normal. Thought content is not paranoid or delusional. Thought content does not include homicidal or suicidal ideation. Thought content does not include homicidal or suicidal plan.     ?   Cognition and Memory: Cognition and memory normal.     ?   Judgment: Judgment normal.  ?   Comments:  Insight intact ?Mood is appropriate to content and affect is congruent- ie, visibly sad when discussing recent losses  ? ? ?Lab Review:  ?   ?Component Value Date/Time  ? NA 137 02/05/2017 0355  ? K 4.4 02/05/2017 0355  ? CL 106 02/05/2017 0355  ? CO2 27 02/05/2017 0355  ? GLUCOSE 209 (H)  02/05/2017 0355  ? BUN 15 02/05/2017 0355  ? CREATININE 0.85 02/05/2017 0355  ? CALCIUM 8.7 (L) 02/05/2017 0355  ? PROT 6.8 01/26/2012 0915  ? ALBUMIN 3.7 01/26/2012 0915  ? AST 17 01/26/2012 0915  ? ALT 21 01/26/2012 0915  ? ALKPHOS 54 01/26/2012 0915  ? BILITOT 0.3 01/26/2012 0915  ? GFRNONAA >60 02/05/2017 0355  ? GFRAA >60 02/05/2017 0355  ? ? ?   ?Component Value Date/Time  ? WBC 17.3 (H) 02/05/2017 0355  ? RBC 4.75 02/05/2017 0355  ? HGB 13.6 02/05/2017 0355  ? HCT 41.7 02/05/2017 0355  ? PLT 263 02/05/2017 0355  ? MCV 87.8 02/05/2017 0355  ? MCH 28.6 02/05/2017 0355  ? MCHC 32.6 02/05/2017 0355  ? RDW 13.6 02/05/2017 0355  ? LYMPHSABS 2.7 01/26/2012 0915  ? MONOABS 0.6 01/26/2012 0915  ? EOSABS 0.1 01/26/2012 0915  ? BASOSABS 0.0 01/26/2012 0915  ? ? ?No results found for: POCLITH, LITHIUM  ? ?No results found for: PHENYTOIN, PHENOBARB, VALPROATE, CBMZ  ? ?.res ?Assessment: Plan:   ?Pt seen for 30 minutes and time spent reviewing labs results and discussing that Lithium level was 0.8 and Creatinine levels were normal. He reports that he would like to continue current medications and requests to continue Diazepam during son's murder investigation. Will continue Diazepam 5 mg po BID prn anxiety.  ?Continue Buspar 15 mg po BID for anxiety.  ?Continue Effexor XR 300 mg po qd for mood and anxiety s/s.  ?Pt to follow-up in 3 months or sooner if clinically indicated.  ?Patient advised to contact office with any questions, adverse effects, or acute worsening in signs and symptoms. ? ?Gordy was seen today for anxiety and sleeping problem. ? ?Diagnoses and all orders for this visit: ? ?Anxiety state ?-     diazepam (VALIUM) 5 MG  tablet; Take 1 tablet (5 mg total) by mouth 2 (two) times daily as needed for anxiety. ?-     busPIRone (BUSPAR) 15 MG tablet; 1 bid ?-     venlafaxine XR (EFFEXOR-XR) 150 MG 24 hr capsule; TAKE 2 CAPSULES BY MO

## 2022-03-09 DIAGNOSIS — Z20822 Contact with and (suspected) exposure to covid-19: Secondary | ICD-10-CM | POA: Diagnosis not present

## 2022-03-26 DIAGNOSIS — Z20822 Contact with and (suspected) exposure to covid-19: Secondary | ICD-10-CM | POA: Diagnosis not present

## 2022-05-12 ENCOUNTER — Encounter: Payer: Self-pay | Admitting: Psychiatry

## 2022-05-12 ENCOUNTER — Ambulatory Visit (INDEPENDENT_AMBULATORY_CARE_PROVIDER_SITE_OTHER): Payer: Medicare Other | Admitting: Psychiatry

## 2022-05-12 DIAGNOSIS — F411 Generalized anxiety disorder: Secondary | ICD-10-CM

## 2022-05-12 DIAGNOSIS — F32A Depression, unspecified: Secondary | ICD-10-CM | POA: Diagnosis not present

## 2022-05-12 MED ORDER — VENLAFAXINE HCL ER 150 MG PO CP24: ORAL_CAPSULE | ORAL | 0 refills | Status: DC

## 2022-05-12 MED ORDER — LITHIUM CARBONATE 300 MG PO CAPS: ORAL_CAPSULE | ORAL | 0 refills | Status: DC

## 2022-05-12 MED ORDER — DIAZEPAM 5 MG PO TABS: 5.0000 mg | ORAL_TABLET | Freq: Two times a day (BID) | ORAL | 2 refills | Status: DC | PRN

## 2022-05-12 MED ORDER — BUSPIRONE HCL 15 MG PO TABS: ORAL_TABLET | ORAL | 0 refills | Status: DC

## 2022-05-12 NOTE — Progress Notes (Signed)
Erik Holmes [id number] 08-22-1961 61 y.o.  Subjective:   Patient ID:  Erik Holmes. is a 61 y.o. (DOB 05-21-1961) male.  Chief Complaint:  Chief Complaint  Patient presents with   grief   Anxiety   Depression    Anxiety    Depression        Past medical history includes anxiety.    Erik Holmes. presents to the office today for follow-up of anxiety and mood disturbance.  He reports that his energy is low. Sleeping well. He reports some anxiety when he talks about his deceased son. He reports that there are times that grief will hit him unexpectedly. He reports that he is able to remember good memories about his son. He reports concern about  his grandchildren losing their father. "I'm trying to maintain." He reports that he has some mild depression with losing his son and other family members. He reports that Father's day was difficult since his son used to come to visit every ather's day. He reports that his motivation is good and has been doing different things around the house. He reports that he has noticed his memory and recall has not been as good recently. Sleeping well and averaging 7-10 hours a night. Appetite has been good. He has been trying to lose weight. Denies SI.   He reports that he used to be more impulsive.   Two of his dogs had puppies within less than a week and they had 16 puppies. One of the puppies had to have heart surgery and "we almost lost her." He has been taking care of this puppy and "nursed her back to health." They now have 7 dogs.   He reports that use of Diazepam prn varies. He occasionally will take 1/2 tab three times daily.    Mini-Mental    Flowsheet Row Office Visit from 07/29/2014 in [facility]  Total Score (max 30 points ) 27      PHQ2-9    [place] Visit from 02/13/2018 in Primary Care at [facility] Total Score 0        Review of Systems:  Review of Systems   Gastrointestinal: Negative.   Musculoskeletal:  Positive for arthralgias. Negative for gait problem.  Neurological:  Negative for tremors.  Psychiatric/Behavioral:  Positive for depression.        Please refer to HPI    Medications: I have reviewed the patient's current medications.  Current Outpatient Medications  Medication Sig Dispense Refill   albuterol (PROVENTIL HFA;VENTOLIN HFA) 108 (90 Base) MCG/ACT inhaler Inhale 2 puffs into the lungs every 6 (six) hours as needed for wheezing or shortness of breath. 1 Inhaler 0   busPIRone (BUSPAR) 15 MG tablet 1 bid 180 tablet 0   [START ON 06/09/2022] diazepam (VALIUM) 5 MG tablet Take 1 tablet (5 mg total) by mouth 2 (two) times daily as needed for anxiety. 60 tablet 2   EPINEPHrine (EPIPEN 2-PAK) 0.3 mg/0.3 mL IJ SOAJ injection Take 0.3 mg by mouth daily as needed for anaphylaxis.     lisinopril (PRINIVIL,ZESTRIL) 10 MG tablet Take 10 mg by mouth daily.  0   lithium carbonate 300 MG capsule 1qam and 2qhs 270 capsule 0   metFORMIN (GLUCOPHAGE-XR) 500 MG 24 hr tablet Take 1,000 mg by mouth 2 (two) times a day.      NON FORMULARY CPAP AT BEDTIME     rosuvastatin (CRESTOR) 5 MG tablet TK 1 T PO  QD FOR CHOLESTEROL     tadalafil (CIALIS) 20 MG tablet Take 10-20 mg by mouth daily as needed for erectile dysfunction. (Patient not taking: Reported on 02/11/2021)     venlafaxine XR (EFFEXOR-XR) 150 MG 24 hr capsule TAKE 2 CAPSULES BY MOUTH  DAILY WITH BREAKFAST 180 capsule 0   No current facility-administered medications for this visit.    Medication Side Effects: None  Allergies:  Allergies  Allergen Reactions   Penicillins Anaphylaxis and Other (See Comments)    Has patient had a PCN reaction causing immediate rash, facial/tongue/throat swelling, SOB or lightheadedness with hypotension:Yes Has patient had a PCN reaction causing severe rash involving mucus membranes or skin necrosis:No Has patient had a PCN reaction that required  hospitalization:Treated in ER for reaction Has patient had a PCN reaction occurring within the last 10 years:Yes If all of the above answers are "NO", then may proceed with Cephalosporin use.    Codeine Other (See Comments)    Made pt feel like skin was crawling   Other Other (See Comments)    "bee stings"   Sildenafil Citrate Other (See Comments)   Xanax [Alprazolam]     Made him feel angry    Past Medical History:  Diagnosis Date   Allergy    Anxiety    Cataract    Degenerative disc disease, lumbar    Depression    Diabetes mellitus without complication ([place])    Diabetes mellitus, type II ([place])    ED (erectile dysfunction)    GERD (gastroesophageal reflux disease)    Gout    Hypertension    Internal hemorrhoids with prolapse, bleeding 04/24/2014   RLS (restless legs syndrome)    Seizures ([place])    last seizure around 2016 per patient.   Shortness of breath    Sleep apnea    cpap   Tubular adenoma of colon    multiple, TV adenomas also    Past Medical History, Surgical history, Social history, and Family history were reviewed and updated as appropriate.   Please see review of systems for further details on the patient's review from today.   Objective:   Physical Exam:  Wt 197 lb (89.4 kg)   BMI 29.09 kg/m   Physical Exam Constitutional:      General: He is not in acute distress. Musculoskeletal:        General: No deformity.  Neurological:     Mental Status: He is alert and oriented to person, place, and time.     Coordination: Coordination normal.  Psychiatric:        Attention and Perception: Attention and perception normal. He does not perceive auditory or visual hallucinations.        Mood and Affect: Affect is not labile, blunt, angry or inappropriate.        Speech: Speech normal.        Behavior: Behavior normal.        Thought Content: Thought content normal. Thought content is not paranoid or delusional. Thought content does not include homicidal or  suicidal ideation. Thought content does not include homicidal or suicidal plan.        Cognition and Memory: Cognition and memory normal.        Judgment: Judgment normal.     Comments: Insight intact Mood is appropriate to content. Affect is congruent     Lab Review:     Component Value Date/Time   NA 137 02/05/2017 0355   K 4.4 02/05/2017 0355  CL 106 02/05/2017 0355   CO2 27 02/05/2017 0355   GLUCOSE 209 (H) 02/05/2017 0355   BUN 15 02/05/2017 0355   CREATININE 0.85 02/05/2017 0355   CALCIUM 8.7 (L) 02/05/2017 0355   PROT 6.8 01/26/2012 0915   ALBUMIN 3.7 01/26/2012 0915   AST 17 01/26/2012 0915   ALT 21 01/26/2012 0915   ALKPHOS 54 01/26/2012 0915   BILITOT 0.3 01/26/2012 0915   GFRNONAA >60 02/05/2017 0355   GFRAA >60 02/05/2017 0355       Component Value Date/Time   WBC 17.3 (H) 02/05/2017 0355   RBC 4.75 02/05/2017 0355   HGB 13.6 02/05/2017 0355   HCT 41.7 02/05/2017 0355   PLT 263 02/05/2017 0355   MCV 87.8 02/05/2017 0355   MCH 28.6 02/05/2017 0355   MCHC 32.6 02/05/2017 0355   RDW 13.6 02/05/2017 0355   LYMPHSABS 2.7 01/26/2012 0915   MONOABS 0.6 01/26/2012 0915   EOSABS 0.1 01/26/2012 0915   BASOSABS 0.0 01/26/2012 0915    No results found for: "POCLITH", "LITHIUM"   No results found for: "PHENYTOIN", "PHENOBARB", "VALPROATE", "CBMZ"   .res Assessment: Plan:   Pt seen for 30 minutes and time spent discussing grief process. He reports that he is now initiating more activities and tasks and mood is improving overall. Will continue to use Diazepam 5 mg po BID prn for anxiety.  Continue Effexor XR 300 mg po qd for mood and anxiety.  Continue Buspar 15 mg po BID for anxiety.  Continue Lithium 300 mg in the morning and 600 mg at bedtime for mood symptoms.  Pt to follow-up in 3 months or sooner if clinically indicated.  Patient advised to contact office with any questions, adverse effects, or acute worsening in signs and symptoms.   Nolin was seen  today for grief, anxiety and depression.  Diagnoses and all orders for this visit:  Anxiety state -     diazepam (VALIUM) 5 MG tablet; Take 1 tablet (5 mg total) by mouth 2 (two) times daily as needed for anxiety. -     busPIRone (BUSPAR) 15 MG tablet; 1 bid -     venlafaxine XR (EFFEXOR-XR) 150 MG 24 hr capsule; TAKE 2 CAPSULES BY MOUTH  DAILY WITH BREAKFAST  Depression, unspecified depression type -     venlafaxine XR (EFFEXOR-XR) 150 MG 24 hr capsule; TAKE 2 CAPSULES BY MOUTH  DAILY WITH BREAKFAST -     lithium carbonate 300 MG capsule; 1qam and 2qhs     Please see After Visit Summary for patient specific instructions.  Future Appointments  Date Time Provider [place]  08/12/2022  8:30 AM Thayer Headings, PMHNP [facility] None     No orders of the defined types were placed in this encounter.   -------------------------------

## 2022-05-17 ENCOUNTER — Telehealth: Payer: Self-pay | Admitting: Psychiatry

## 2022-08-03 ENCOUNTER — Other Ambulatory Visit: Payer: Self-pay | Admitting: Psychiatry

## 2022-08-03 DIAGNOSIS — F32A Depression, unspecified: Secondary | ICD-10-CM

## 2022-08-03 DIAGNOSIS — F411 Generalized anxiety disorder: Secondary | ICD-10-CM

## 2022-08-12 ENCOUNTER — Encounter: Payer: Self-pay | Admitting: Psychiatry

## 2022-08-12 ENCOUNTER — Ambulatory Visit (INDEPENDENT_AMBULATORY_CARE_PROVIDER_SITE_OTHER): Payer: Medicare Other | Admitting: Psychiatry

## 2022-08-12 DIAGNOSIS — F411 Generalized anxiety disorder: Secondary | ICD-10-CM

## 2022-08-12 DIAGNOSIS — F32A Depression, unspecified: Secondary | ICD-10-CM | POA: Diagnosis not present

## 2022-08-12 MED ORDER — LITHIUM CARBONATE 300 MG PO CAPS: ORAL_CAPSULE | ORAL | 1 refills | Status: DC

## 2022-08-12 MED ORDER — BUSPIRONE HCL 15 MG PO TABS: ORAL_TABLET | ORAL | 1 refills | Status: DC

## 2022-08-12 NOTE — Progress Notes (Signed)
Benedict Kue [id number] January 12, 1961 61 y.o.  Subjective:   Patient ID:  Erik Holmes. is a 61 y.o. (DOB 01-16-1961) male.  Chief Complaint:  Chief Complaint  Patient presents with   Follow-up    Anxiety and mood disturbance    HPI Md Smola. presents to the office today for follow-up of anxiety and mood disturbance.   Last Thursday was his son's birthday. He reports that the week before law enforcement informed him they were not pressing charges in his son's murder investigation. October 17th is the first anniversary of son's murder.   "I try not to get upset as much... I'm able to talk about it a little more." He reports that he is taking Diazepam prn some "but not as much." He reports some worry about how son's murder is affecting grandchildren. He reports sleeping "too much at times." He reports periods of depression with low energy and motivation. He reports that he typically feels better after sleeping for a couple of hours. He reports difficulty with concentration and memory. Appetite and weight have been stable. Denies SI.   Yolanda Bonine has been spending time with him and this has brought him joy. They are working together to restore a vehicle in memory of his son.   Diazepam last filled 06/21/22, 05/12/22, 04/13/22.     Mini-Mental    Flowsheet Row Office Visit from 07/29/2014 in [facility]  Total Score (max 30 points ) 27      PHQ2-9    [place] Visit from 02/13/2018 in Primary Care at [facility] Total Score 0        Review of Systems:  Review of Systems  Musculoskeletal:  Positive for arthralgias. Negative for gait problem.  Neurological:  Negative for tremors and headaches.  Psychiatric/Behavioral:         Please refer to HPI    Medications: I have reviewed the patient's current medications.  Current Outpatient Medications  Medication Sig Dispense Refill   metFORMIN (GLUCOPHAGE-XR) 500 MG 24 hr tablet  Take 1,000 mg by mouth 2 (two) times a day.      rosuvastatin (CRESTOR) 5 MG tablet TK 1 T PO QD FOR CHOLESTEROL     albuterol (PROVENTIL HFA;VENTOLIN HFA) 108 (90 Base) MCG/ACT inhaler Inhale 2 puffs into the lungs every 6 (six) hours as needed for wheezing or shortness of breath. 1 Inhaler 0   busPIRone (BUSPAR) 15 MG tablet 1 bid 180 tablet 1   diazepam (VALIUM) 5 MG tablet Take 1 tablet (5 mg total) by mouth 2 (two) times daily as needed for anxiety. 60 tablet 2   EPINEPHrine (EPIPEN 2-PAK) 0.3 mg/0.3 mL IJ SOAJ injection Take 0.3 mg by mouth daily as needed for anaphylaxis.     lisinopril (PRINIVIL,ZESTRIL) 10 MG tablet Take 10 mg by mouth daily.  0   lithium carbonate 300 MG capsule 1qam and 2qhs 270 capsule 1   NON FORMULARY CPAP AT BEDTIME     tadalafil (CIALIS) 20 MG tablet Take 10-20 mg by mouth daily as needed for erectile dysfunction. (Patient not taking: Reported on 02/11/2021)     venlafaxine XR (EFFEXOR-XR) 150 MG 24 hr capsule TAKE 2 CAPSULES BY MOUTH DAILY  WITH BREAKFAST 180 capsule 3   No current facility-administered medications for this visit.    Medication Side Effects: None  Allergies:  Allergies  Allergen Reactions   Penicillins Anaphylaxis and Other (See Comments)    Has patient had a  PCN reaction causing immediate rash, facial/tongue/throat swelling, SOB or lightheadedness with hypotension:Yes Has patient had a PCN reaction causing severe rash involving mucus membranes or skin necrosis:No Has patient had a PCN reaction that required hospitalization:Treated in ER for reaction Has patient had a PCN reaction occurring within the last 10 years:Yes If all of the above answers are "NO", then may proceed with Cephalosporin use.    Codeine Other (See Comments)    Made pt feel like skin was crawling   Other Other (See Comments)    "bee stings"   Sildenafil Citrate Other (See Comments)   Xanax [Alprazolam]     Made him feel angry    Past Medical History:  Diagnosis  Date   Allergy    Anxiety    Cataract    Degenerative disc disease, lumbar    Depression    Diabetes mellitus without complication ([place])    Diabetes mellitus, type II ([place])    ED (erectile dysfunction)    GERD (gastroesophageal reflux disease)    Gout    Hypertension    Internal hemorrhoids with prolapse, bleeding 04/24/2014   RLS (restless legs syndrome)    Seizures ([place])    last seizure around 2016 per patient.   Shortness of breath    Sleep apnea    cpap   Tubular adenoma of colon    multiple, TV adenomas also    Past Medical History, Surgical history, Social history, and Family history were reviewed and updated as appropriate.   Please see review of systems for further details on the patient's review from today.   Objective:   Physical Exam:  Wt 195 lb (88.5 kg)   BMI 28.80 kg/m   Physical Exam Constitutional:      General: He is not in acute distress. Musculoskeletal:        General: No deformity.  Neurological:     Mental Status: He is alert and oriented to person, place, and time.     Coordination: Coordination normal.  Psychiatric:        Attention and Perception: Attention and perception normal. He does not perceive auditory or visual hallucinations.        Mood and Affect: Affect is not labile, blunt, angry or inappropriate.        Speech: Speech normal.        Behavior: Behavior normal.        Thought Content: Thought content normal. Thought content is not paranoid or delusional. Thought content does not include homicidal or suicidal ideation. Thought content does not include homicidal or suicidal plan.        Cognition and Memory: Cognition and memory normal.        Judgment: Judgment normal.     Comments: Insight intact Mood is appropriate to content. Affect is congruent     Lab Review:     Component Value Date/Time   NA 137 02/05/2017 0355   K 4.4 02/05/2017 0355   CL 106 02/05/2017 0355   CO2 27 02/05/2017 0355   GLUCOSE 209 (H) 02/05/2017  0355   BUN 15 02/05/2017 0355   CREATININE 0.85 02/05/2017 0355   CALCIUM 8.7 (L) 02/05/2017 0355   PROT 6.8 01/26/2012 0915   ALBUMIN 3.7 01/26/2012 0915   AST 17 01/26/2012 0915   ALT 21 01/26/2012 0915   ALKPHOS 54 01/26/2012 0915   BILITOT 0.3 01/26/2012 0915   GFRNONAA >60 02/05/2017 0355   GFRAA >60 02/05/2017 0355  Component Value Date/Time   WBC 17.3 (H) 02/05/2017 0355   RBC 4.75 02/05/2017 0355   HGB 13.6 02/05/2017 0355   HCT 41.7 02/05/2017 0355   PLT 263 02/05/2017 0355   MCV 87.8 02/05/2017 0355   MCH 28.6 02/05/2017 0355   MCHC 32.6 02/05/2017 0355   RDW 13.6 02/05/2017 0355   LYMPHSABS 2.7 01/26/2012 0915   MONOABS 0.6 01/26/2012 0915   EOSABS 0.1 01/26/2012 0915   BASOSABS 0.0 01/26/2012 0915    No results found for: "POCLITH", "LITHIUM"   No results found for: "PHENYTOIN", "PHENOBARB", "VALPROATE", "CBMZ"   .res Assessment: Plan:   Pt seen for 25 minutes and time spent discussing how recent information about son's murder investigation has affected his mood and anxiety. He reports that he continues to experience moments of grief-related anxiety and depression, however "overall I'm doing alright."  He reports that medications seem to be effective and he would like to continue current medications. He reports that his Diazepam prn use has been decreasing and that his goal is to continue to reduce Diazepam prn use over time. Agreed that his need for Diazepam prn will likely be higher at times over the next few months with upcoming anniversary of his son's murder and the holidays.  Continue Diazepam 5 mg po BID prn for anxiety.  Continue Effexor XR 300 mg po qd for mood and anxiety.  Continue Buspar 15 mg po BID for anxiety.  Continue Lithium 300 mg in the morning and 600 mg at bedtime for mood symptoms.  Pt to follow-up in 3-4 months or sooner if clinically indicated.  Patient advised to contact office with any questions, adverse effects, or acute  worsening in signs and symptoms.  Usher was seen today for follow-up.  Diagnoses and all orders for this visit:  Anxiety state -     busPIRone (BUSPAR) 15 MG tablet; 1 bid  Depression, unspecified depression type -     lithium carbonate 300 MG capsule; 1qam and 2qhs     Please see After Visit Summary for patient specific instructions.  Future Appointments  Date Time Provider [place]  11/30/2022  8:30 AM Thayer Headings, PMHNP CP-CP None    No orders of the defined types were placed in this encounter.   -------------------------------

## 2022-08-30 ENCOUNTER — Other Ambulatory Visit: Payer: Self-pay

## 2022-08-30 DIAGNOSIS — F411 Generalized anxiety disorder: Secondary | ICD-10-CM

## 2022-08-30 MED ORDER — DIAZEPAM 5 MG PO TABS: 5.0000 mg | ORAL_TABLET | Freq: Two times a day (BID) | ORAL | 2 refills | Status: DC | PRN

## 2022-08-30 NOTE — Telephone Encounter (Signed)
Spoke to pharmacy and cancelled

## 2022-08-30 NOTE — Telephone Encounter (Signed)
It looks like his pharmacy filled Diazepam scripts out of order, ie. Filled a script from march, then one from 05/12/22, then back to the script from March. He should still have refills remaining on the script from 05/12/22, however he mentioned at his apt that pharmacy was not filling it. Would you please call to cancel any remaining refills on file except for the script for today? Pharmacy confirmed at last visit that there were refills on file. That may help reduce confusion and make sure he gets med filled.

## 2022-09-14 DIAGNOSIS — F411 Generalized anxiety disorder: Secondary | ICD-10-CM | POA: Diagnosis not present

## 2022-09-14 DIAGNOSIS — N529 Male erectile dysfunction, unspecified: Secondary | ICD-10-CM | POA: Diagnosis not present

## 2022-09-14 DIAGNOSIS — I10 Essential (primary) hypertension: Secondary | ICD-10-CM | POA: Diagnosis not present

## 2022-09-14 DIAGNOSIS — Z86010 Personal history of colonic polyps: Secondary | ICD-10-CM | POA: Diagnosis not present

## 2022-09-14 DIAGNOSIS — G4733 Obstructive sleep apnea (adult) (pediatric): Secondary | ICD-10-CM | POA: Diagnosis not present

## 2022-09-14 DIAGNOSIS — E1169 Type 2 diabetes mellitus with other specified complication: Secondary | ICD-10-CM | POA: Diagnosis not present

## 2022-09-14 DIAGNOSIS — Z0000 Encounter for general adult medical examination without abnormal findings: Secondary | ICD-10-CM | POA: Diagnosis not present

## 2022-09-14 DIAGNOSIS — F39 Unspecified mood [affective] disorder: Secondary | ICD-10-CM | POA: Diagnosis not present

## 2022-09-14 DIAGNOSIS — E785 Hyperlipidemia, unspecified: Secondary | ICD-10-CM | POA: Diagnosis not present

## 2022-09-14 DIAGNOSIS — F319 Bipolar disorder, unspecified: Secondary | ICD-10-CM | POA: Diagnosis not present

## 2022-09-14 DIAGNOSIS — Z23 Encounter for immunization: Secondary | ICD-10-CM | POA: Diagnosis not present

## 2022-09-14 DIAGNOSIS — Z79899 Other long term (current) drug therapy: Secondary | ICD-10-CM | POA: Diagnosis not present

## 2022-11-30 ENCOUNTER — Encounter: Payer: Self-pay | Admitting: Psychiatry

## 2022-11-30 ENCOUNTER — Ambulatory Visit (INDEPENDENT_AMBULATORY_CARE_PROVIDER_SITE_OTHER): Payer: Medicare Other | Admitting: Psychiatry

## 2022-11-30 DIAGNOSIS — F32A Depression, unspecified: Secondary | ICD-10-CM | POA: Diagnosis not present

## 2022-11-30 DIAGNOSIS — F411 Generalized anxiety disorder: Secondary | ICD-10-CM

## 2022-11-30 MED ORDER — VENLAFAXINE HCL ER 150 MG PO CP24: ORAL_CAPSULE | ORAL | 3 refills | Status: DC

## 2022-11-30 MED ORDER — LITHIUM CARBONATE 300 MG PO CAPS: ORAL_CAPSULE | ORAL | 1 refills | Status: DC

## 2022-11-30 MED ORDER — BUSPIRONE HCL 15 MG PO TABS: ORAL_TABLET | ORAL | 1 refills | Status: DC

## 2022-11-30 NOTE — Progress Notes (Signed)
Erik Holmes [id number] 08/20/1961 62 y.o.  Subjective:   Patient ID:  Erik Holmes. is a 62 y.o. (DOB [birth year]) male.  Chief Complaint:  Chief Complaint  Patient presents with   Follow-up    Anxiety and depression    HPI Erik Holmes. presents to the office today for follow-up of anxiety and mood disturbance. He reports overall, "I guess good." He reports that he still has good days and bad days. "I still ain't happy-go-lucky like I was." He continues to grieve the loss of his son and his mother. He reports "some days are harder than others."Denies persistent depression. He reports that he tries to focus on "good things." Anxiety varies. He reports that his memory is "fogged." He reports that concentration varies and that he is able to focus once he starts something constructive. Sleeping well overall with cPap. He is on his second cPap. Energy and motivation have improved with cPap. He reports that his thinking is clearer with cPap. Appetite is fair. Denies SI.   He and his family were sick through the holidays. He was able to see his other son recently.   He is doing different home improvement projects, like painting the garage. He reports that he has been enjoying making home improvements.   Diazepam last filled 08/30/22.  He enjoys his dogs.   Mini-Mental    Flowsheet Row Office Visit from 07/29/2014 in [facility]  Total Score (max 30 points ) 27      PHQ2-9    [place] Visit from 02/13/2018 in Primary Care at [facility] Total Score 0        Review of Systems:  Review of Systems  Gastrointestinal: Negative.   Musculoskeletal:  Negative for gait problem.  Neurological:  Negative for tremors.       He denies any recent RLS  Psychiatric/Behavioral:         Please refer to HPI    Medications: I have reviewed the patient's current medications.  Current Outpatient Medications  Medication Sig Dispense Refill    albuterol (PROVENTIL HFA;VENTOLIN HFA) 108 (90 Base) MCG/ACT inhaler Inhale 2 puffs into the lungs every 6 (six) hours as needed for wheezing or shortness of breath. 1 Inhaler 0   diazepam (VALIUM) 5 MG tablet Take 1 tablet (5 mg total) by mouth 2 (two) times daily as needed for anxiety. 60 tablet 2   metFORMIN (GLUCOPHAGE-XR) 500 MG 24 hr tablet Take 1,000 mg by mouth 2 (two) times a day.      rosuvastatin (CRESTOR) 5 MG tablet TK 1 T PO QD FOR CHOLESTEROL     busPIRone (BUSPAR) 15 MG tablet 1 bid 180 tablet 1   EPINEPHrine (EPIPEN 2-PAK) 0.3 mg/0.3 mL IJ SOAJ injection Take 0.3 mg by mouth daily as needed for anaphylaxis.     lithium carbonate 300 MG capsule 1qam and 2qhs 270 capsule 1   NON FORMULARY CPAP AT BEDTIME     tadalafil (CIALIS) 20 MG tablet Take 10-20 mg by mouth daily as needed for erectile dysfunction. (Patient not taking: Reported on 02/11/2021)     venlafaxine XR (EFFEXOR-XR) 150 MG 24 hr capsule TAKE 2 CAPSULES BY MOUTH DAILY  WITH BREAKFAST 180 capsule 3   No current facility-administered medications for this visit.    Medication Side Effects: None  Allergies:  Allergies  Allergen Reactions   Penicillins Anaphylaxis and Other (See Comments)    Has patient had a PCN reaction  causing immediate rash, facial/tongue/throat swelling, SOB or lightheadedness with hypotension:Yes Has patient had a PCN reaction causing severe rash involving mucus membranes or skin necrosis:No Has patient had a PCN reaction that required hospitalization:Treated in ER for reaction Has patient had a PCN reaction occurring within the last 10 years:Yes If all of the above answers are "NO", then may proceed with Cephalosporin use.    Codeine Other (See Comments)    Made pt feel like skin was crawling   Other Other (See Comments)    "bee stings"   Sildenafil Citrate Other (See Comments)   Xanax [Alprazolam]     Made him feel angry    Past Medical History:  Diagnosis Date   Allergy     Anxiety    Cataract    Degenerative disc disease, lumbar    Depression    Diabetes mellitus without complication ([place])    Diabetes mellitus, type II ([place])    ED (erectile dysfunction)    GERD (gastroesophageal reflux disease)    Gout    Hypertension    Internal hemorrhoids with prolapse, bleeding 04/24/2014   RLS (restless legs syndrome)    Seizures ([place])    last seizure around 2016 per patient.   Shortness of breath    Sleep apnea    cpap   Tubular adenoma of colon    multiple, TV adenomas also    Past Medical History, Surgical history, Social history, and Family history were reviewed and updated as appropriate.   Please see review of systems for further details on the patient's review from today.   Objective:   Physical Exam:  Wt 200 lb (90.7 kg)   BMI 29.53 kg/m   Physical Exam Constitutional:      General: He is not in acute distress. Musculoskeletal:        General: No deformity.  Neurological:     Mental Status: He is alert and oriented to person, place, and time.     Coordination: Coordination normal.  Psychiatric:        Attention and Perception: Attention and perception normal. He does not perceive auditory or visual hallucinations.        Mood and Affect: Affect is not labile, blunt, angry or inappropriate.        Speech: Speech normal.        Behavior: Behavior normal.        Thought Content: Thought content normal. Thought content is not paranoid or delusional. Thought content does not include homicidal or suicidal ideation. Thought content does not include homicidal or suicidal plan.        Cognition and Memory: Cognition and memory normal.        Judgment: Judgment normal.     Comments: Insight intact Mood is appropriate to content and affect is congruent. Mood becomes sad when talking about losses and affect brightens when talking about pets, hobbies, etc.     Lab Review:     Component Value Date/Time   NA 137 02/05/2017 0355   K 4.4 02/05/2017  0355   CL 106 02/05/2017 0355   CO2 27 02/05/2017 0355   GLUCOSE 209 (H) 02/05/2017 0355   BUN 15 02/05/2017 0355   CREATININE 0.85 02/05/2017 0355   CALCIUM 8.7 (L) 02/05/2017 0355   PROT 6.8 01/26/2012 0915   ALBUMIN 3.7 01/26/2012 0915   AST 17 01/26/2012 0915   ALT 21 01/26/2012 0915   ALKPHOS 54 01/26/2012 0915   BILITOT 0.3 01/26/2012 0915   GFRNONAA >  60 02/05/2017 0355   GFRAA >60 02/05/2017 0355       Component Value Date/Time   WBC 17.3 (H) 02/05/2017 0355   RBC 4.75 02/05/2017 0355   HGB 13.6 02/05/2017 0355   HCT 41.7 02/05/2017 0355   PLT 263 02/05/2017 0355   MCV 87.8 02/05/2017 0355   MCH 28.6 02/05/2017 0355   MCHC 32.6 02/05/2017 0355   RDW 13.6 02/05/2017 0355   LYMPHSABS 2.7 01/26/2012 0915   MONOABS 0.6 01/26/2012 0915   EOSABS 0.1 01/26/2012 0915   BASOSABS 0.0 01/26/2012 0915    No results found for: "POCLITH", "LITHIUM"   No results found for: "PHENYTOIN", "PHENOBARB", "VALPROATE", "CBMZ"   .res Assessment: Plan:    Recommend continuing current plan of care since pt reports that his mood and anxiety symptoms have been gradually improving following the loss of his son.  Continue Diazepam 5 mg po BID prn for anxiety.  Continue Effexor XR 300 mg po qd for mood and anxiety.  Continue Buspar 15 mg po BID for anxiety.  Continue Lithium 300 mg in the morning and 600 mg at bedtime for mood symptoms.  Pt to follow-up in 4 months or sooner if clinically indicated.  Patient advised to contact office with any questions, adverse effects, or acute worsening in signs and symptoms.   Erik Holmes was seen today for follow-up.  Diagnoses and all orders for this visit:  Anxiety state -     busPIRone (BUSPAR) 15 MG tablet; 1 bid -     venlafaxine XR (EFFEXOR-XR) 150 MG 24 hr capsule; TAKE 2 CAPSULES BY MOUTH DAILY  WITH BREAKFAST  Depression, unspecified depression type -     lithium carbonate 300 MG capsule; 1qam and 2qhs -     venlafaxine XR (EFFEXOR-XR) 150  MG 24 hr capsule; TAKE 2 CAPSULES BY MOUTH DAILY  WITH BREAKFAST     Please see After Visit Summary for patient specific instructions.  Future Appointments  Date Time Provider [place]  03/31/2023  8:30 AM Thayer Headings, PMHNP CP-CP None    No orders of the defined types were placed in this encounter.   -------------------------------

## 2023-01-29 ENCOUNTER — Other Ambulatory Visit: Payer: Self-pay

## 2023-01-29 DIAGNOSIS — F411 Generalized anxiety disorder: Secondary | ICD-10-CM

## 2023-01-30 MED ORDER — DIAZEPAM 5 MG PO TABS: 5.0000 mg | ORAL_TABLET | Freq: Two times a day (BID) | ORAL | 2 refills | Status: DC | PRN

## 2023-03-16 DIAGNOSIS — E785 Hyperlipidemia, unspecified: Secondary | ICD-10-CM | POA: Diagnosis not present

## 2023-03-16 DIAGNOSIS — E1169 Type 2 diabetes mellitus with other specified complication: Secondary | ICD-10-CM | POA: Diagnosis not present

## 2023-03-16 DIAGNOSIS — F17200 Nicotine dependence, unspecified, uncomplicated: Secondary | ICD-10-CM | POA: Diagnosis not present

## 2023-03-16 DIAGNOSIS — Z86010 Personal history of colonic polyps: Secondary | ICD-10-CM | POA: Diagnosis not present

## 2023-03-16 DIAGNOSIS — F39 Unspecified mood [affective] disorder: Secondary | ICD-10-CM | POA: Diagnosis not present

## 2023-03-16 DIAGNOSIS — I10 Essential (primary) hypertension: Secondary | ICD-10-CM | POA: Diagnosis not present

## 2023-03-16 DIAGNOSIS — F411 Generalized anxiety disorder: Secondary | ICD-10-CM | POA: Diagnosis not present

## 2023-03-16 DIAGNOSIS — G2581 Restless legs syndrome: Secondary | ICD-10-CM | POA: Diagnosis not present

## 2023-03-16 DIAGNOSIS — Z79899 Other long term (current) drug therapy: Secondary | ICD-10-CM | POA: Diagnosis not present

## 2023-03-16 DIAGNOSIS — F319 Bipolar disorder, unspecified: Secondary | ICD-10-CM | POA: Diagnosis not present

## 2023-03-16 DIAGNOSIS — G4733 Obstructive sleep apnea (adult) (pediatric): Secondary | ICD-10-CM | POA: Diagnosis not present

## 2023-03-16 DIAGNOSIS — N529 Male erectile dysfunction, unspecified: Secondary | ICD-10-CM | POA: Diagnosis not present

## 2023-03-31 ENCOUNTER — Ambulatory Visit (INDEPENDENT_AMBULATORY_CARE_PROVIDER_SITE_OTHER): Payer: Medicare Other | Admitting: Psychiatry

## 2023-03-31 ENCOUNTER — Encounter: Payer: Self-pay | Admitting: Psychiatry

## 2023-03-31 DIAGNOSIS — F411 Generalized anxiety disorder: Secondary | ICD-10-CM | POA: Diagnosis not present

## 2023-03-31 DIAGNOSIS — F32A Depression, unspecified: Secondary | ICD-10-CM

## 2023-03-31 MED ORDER — BUSPIRONE HCL 15 MG PO TABS: ORAL_TABLET | ORAL | 1 refills | Status: DC

## 2023-03-31 MED ORDER — DIAZEPAM 5 MG PO TABS: 5.0000 mg | ORAL_TABLET | Freq: Two times a day (BID) | ORAL | 5 refills | Status: DC | PRN

## 2023-03-31 MED ORDER — LITHIUM CARBONATE 300 MG PO CAPS
ORAL_CAPSULE | ORAL | 1 refills | Status: DC
Start: 2023-03-31 — End: 2023-08-01

## 2023-03-31 NOTE — Progress Notes (Unsigned)
Erik Holmes [id number] September 26, 1961 62 y.o.  Subjective:   Patient ID:  Erik Holmes. is a 62 y.o. (DOB [birth year]) male.  Chief Complaint:  Chief Complaint  Patient presents with   Follow-up    Anxiety    HPI Erik Holmes. presents to the office today for follow-up of anxiety and mood disturbance.   He reports that things have been "up and down like a roller coaster." He reports, "I think I am doing good with all the surroundings."   Grandson just turned 82 yo. He has been working on restoring a truck for his grandson. He reports that this is the truck his son bought the week he died. He reports that working on this will bring up memories of his son and that some days he is not able to work on it due to grief. "Sometimes I just get lost in thoughts... sometimes a million miles away thinking about him." He reports that he had a premunition about 6 months before son died that something might happen at his shop. He had a similar feeling days before his father died. Denies severe anxiety or worry. He reports that he tends to stay at home. Denies depressed mood. He reports that his energy and motivation are fair. Concentration has been ok. Sleeping ok. Appetite varies- "sometimes I could eat the house down, and other times I don't eat much." Denies SI.   He reports that ex-wife often interferes with his plans with other family members. He reports that he has been trying to rebuild relationships with other family members. He reports that he was closest with Alycia Rossetti, his son that died. "He's on my mind always."    Mini-Mental    Flowsheet Row Office Visit from 07/29/2014 in [facility]  Total Score (max 30 points ) 27      PHQ2-9    Flowsheet Row Office Visit from 02/13/2018 in Primary Care at [facility] Total Score 0        Review of Systems:  Review of Systems  Endocrine:       Recent elevated Hgb A1C  Musculoskeletal:   Negative for gait problem.  Neurological:  Negative for tremors.  Psychiatric/Behavioral:         Please refer to HPI    Medications: I have reviewed the patient's current medications.  Current Outpatient Medications  Medication Sig Dispense Refill   albuterol (PROVENTIL HFA;VENTOLIN HFA) 108 (90 Base) MCG/ACT inhaler Inhale 2 puffs into the lungs every 6 (six) hours as needed for wheezing or shortness of breath. 1 Inhaler 0   busPIRone (BUSPAR) 15 MG tablet 1 bid 180 tablet 1   diazepam (VALIUM) 5 MG tablet Take 1 tablet (5 mg total) by mouth 2 (two) times daily as needed for anxiety. 60 tablet 2   EPINEPHrine (EPIPEN 2-PAK) 0.3 mg/0.3 mL IJ SOAJ injection Take 0.3 mg by mouth daily as needed for anaphylaxis.     lithium carbonate 300 MG capsule 1qam and 2qhs 270 capsule 1   metFORMIN (GLUCOPHAGE-XR) 500 MG 24 hr tablet Take 1,000 mg by mouth 2 (two) times a day.      NON FORMULARY CPAP AT BEDTIME     rosuvastatin (CRESTOR) 5 MG tablet TK 1 T PO QD FOR CHOLESTEROL     tadalafil (CIALIS) 20 MG tablet Take 10-20 mg by mouth daily as needed for erectile dysfunction. (Patient not taking: Reported on 02/11/2021)     venlafaxine XR (  EFFEXOR-XR) 150 MG 24 hr capsule TAKE 2 CAPSULES BY MOUTH DAILY  WITH BREAKFAST 180 capsule 3   No current facility-administered medications for this visit.    Medication Side Effects: None  Allergies:  Allergies  Allergen Reactions   Penicillins Anaphylaxis and Other (See Comments)    Has patient had a PCN reaction causing immediate rash, facial/tongue/throat swelling, SOB or lightheadedness with hypotension:Yes Has patient had a PCN reaction causing severe rash involving mucus membranes or skin necrosis:No Has patient had a PCN reaction that required hospitalization:Treated in ER for reaction Has patient had a PCN reaction occurring within the last 10 years:Yes If all of the above answers are "NO", then may proceed with Cephalosporin use.    Codeine Other  (See Comments)    Made pt feel like skin was crawling   Other Other (See Comments)    "bee stings"   Sildenafil Citrate Other (See Comments)   Xanax [Alprazolam]     Made him feel angry    Past Medical History:  Diagnosis Date   Allergy    Anxiety    Cataract    Degenerative disc disease, lumbar    Depression    Diabetes mellitus without complication ([facility])    Diabetes mellitus, type II ([facility])    ED (erectile dysfunction)    GERD (gastroesophageal reflux disease)    Gout    Hypertension    Internal hemorrhoids with prolapse, bleeding 04/24/2014   RLS (restless legs syndrome)    Seizures ([facility])    last seizure around 2016 per patient.   Shortness of breath    Sleep apnea    cpap   Tubular adenoma of colon    multiple, TV adenomas also    Past Medical History, Surgical history, Social history, and Family history were reviewed and updated as appropriate.   Please see review of systems for further details on the patient's review from today.   Objective:   Physical Exam:  There were no vitals taken for this visit.  Physical Exam Constitutional:      General: He is not in acute distress. Musculoskeletal:        General: No deformity.  Neurological:     Mental Status: He is alert and oriented to person, place, and time.     Coordination: Coordination normal.  Psychiatric:        Attention and Perception: Attention and perception normal. He does not perceive auditory or visual hallucinations.        Mood and Affect: Affect is not labile, blunt, angry or inappropriate.        Speech: Speech normal.        Behavior: Behavior normal.        Thought Content: Thought content normal. Thought content is not paranoid or delusional. Thought content does not include homicidal or suicidal ideation. Thought content does not include homicidal or suicidal plan.        Cognition and Memory: Cognition and memory normal.        Judgment: Judgment normal.     Comments: Insight intact Mood  is sad in response to loss of son     Lab Review:     Component Value Date/Time   NA 137 02/05/2017 0355   K 4.4 02/05/2017 0355   CL 106 02/05/2017 0355   CO2 27 02/05/2017 0355   GLUCOSE 209 (H) 02/05/2017 0355   BUN 15 02/05/2017 0355   CREATININE 0.85 02/05/2017 0355   CALCIUM 8.7 (L) 02/05/2017 0355  PROT 6.8 01/26/2012 0915   ALBUMIN 3.7 01/26/2012 0915   AST 17 01/26/2012 0915   ALT 21 01/26/2012 0915   ALKPHOS 54 01/26/2012 0915   BILITOT 0.3 01/26/2012 0915   GFRNONAA >60 02/05/2017 0355   GFRAA >60 02/05/2017 0355       Component Value Date/Time   WBC 17.3 (H) 02/05/2017 0355   RBC 4.75 02/05/2017 0355   HGB 13.6 02/05/2017 0355   HCT 41.7 02/05/2017 0355   PLT 263 02/05/2017 0355   MCV 87.8 02/05/2017 0355   MCH 28.6 02/05/2017 0355   MCHC 32.6 02/05/2017 0355   RDW 13.6 02/05/2017 0355   LYMPHSABS 2.7 01/26/2012 0915   MONOABS 0.6 01/26/2012 0915   EOSABS 0.1 01/26/2012 0915   BASOSABS 0.0 01/26/2012 0915    No results found for: "POCLITH", "LITHIUM"   No results found for: "PHENYTOIN", "PHENOBARB", "VALPROATE", "CBMZ"   .res Assessment: Plan:     Labs reviewed from PCP.   Renell was seen today for follow-up.  Diagnoses and all orders for this visit:  Anxiety state     Please see After Visit Summary for patient specific instructions.  No future appointments.   No orders of the defined types were placed in this encounter.   -------------------------------

## 2023-05-09 DIAGNOSIS — F0390 Unspecified dementia without behavioral disturbance: Secondary | ICD-10-CM | POA: Diagnosis not present

## 2023-05-09 DIAGNOSIS — Z720 Tobacco use: Secondary | ICD-10-CM | POA: Diagnosis not present

## 2023-05-09 DIAGNOSIS — J381 Polyp of vocal cord and larynx: Secondary | ICD-10-CM | POA: Diagnosis not present

## 2023-05-09 DIAGNOSIS — R490 Dysphonia: Secondary | ICD-10-CM | POA: Diagnosis not present

## 2023-05-11 ENCOUNTER — Telehealth: Payer: Self-pay

## 2023-05-11 NOTE — Telephone Encounter (Signed)
Please let him know that it should not interact with his medications, however Chantix can sometimes make some people's mood and anxiety worse and other people do not experience this with Chantix. Recommend starting it and then letting his PCP know immediately if he experiences any worsening mood or anxiety since worsening mood or anxiety would likely resolve with stopping Chantix.

## 2023-05-11 NOTE — Telephone Encounter (Signed)
Info provided to wife.

## 2023-05-12 DIAGNOSIS — E119 Type 2 diabetes mellitus without complications: Secondary | ICD-10-CM | POA: Diagnosis not present

## 2023-05-12 DIAGNOSIS — H04123 Dry eye syndrome of bilateral lacrimal glands: Secondary | ICD-10-CM | POA: Diagnosis not present

## 2023-05-12 DIAGNOSIS — H532 Diplopia: Secondary | ICD-10-CM | POA: Diagnosis not present

## 2023-05-12 DIAGNOSIS — H26493 Other secondary cataract, bilateral: Secondary | ICD-10-CM | POA: Diagnosis not present

## 2023-08-01 ENCOUNTER — Encounter: Payer: Self-pay | Admitting: Psychiatry

## 2023-08-01 ENCOUNTER — Ambulatory Visit (INDEPENDENT_AMBULATORY_CARE_PROVIDER_SITE_OTHER): Payer: Medicare Other | Admitting: Psychiatry

## 2023-08-01 DIAGNOSIS — F32A Depression, unspecified: Secondary | ICD-10-CM | POA: Diagnosis not present

## 2023-08-01 DIAGNOSIS — F411 Generalized anxiety disorder: Secondary | ICD-10-CM | POA: Diagnosis not present

## 2023-08-01 MED ORDER — LITHIUM CARBONATE 300 MG PO CAPS
ORAL_CAPSULE | ORAL | 1 refills | Status: DC
Start: 2023-08-01 — End: 2024-01-30

## 2023-08-01 MED ORDER — BUSPIRONE HCL 15 MG PO TABS
ORAL_TABLET | ORAL | 1 refills | Status: DC
Start: 2023-08-01 — End: 2024-01-30

## 2023-08-01 MED ORDER — VENLAFAXINE HCL ER 150 MG PO CP24
ORAL_CAPSULE | ORAL | 3 refills | Status: DC
Start: 2023-08-01 — End: 2024-01-30

## 2023-08-01 NOTE — Progress Notes (Signed)
Erik Holmes [id number] August 15, 1961 62 y.o.  Subjective:   Patient ID:  Erik Holmes. is a 62 y.o. (DOB Apr 07, 1961) male.  Chief Complaint:  Chief Complaint  Patient presents with   Follow-up    Mood disturbance, Anxiety    HPI Erik Holmes. presents to the office today for follow-up of anxiety and mood disturbance.   Saturday will be son's birthday and next month is anniversary of son's death. He reports that he is noticing more grief symptoms with these dates approaching  He reports that he is "doing better about not dwelling on it as long." He reports less episodes of "breaking down" and having uncontrolled crying. Sleeping ok. He reports that his energy is low and that he has the motivation is there to do things. He reports that some days he spends most of his day in his chair listening to music. He reports that his concentration varies and is ok overall. Denies SI.   He continues to work on restoring a truck that son bought before his death with goal to give to grandson.   He does not hear from his daughter or other son very often.   Stopped smoking July 4th. He took Chantix to stop smoking. He reports that his wife noticed some increased irritability for 2 weeks after he started Chantix. He reports that he carries candies if he gets a craving to smoke.   Mini-Mental    Flowsheet Row Office Visit from 07/29/2014 in [facility]  Total Score (max 30 points ) 27      PHQ2-9    Flowsheet Row Office Visit from 02/13/2018 in [facility] Total Score 0        Review of Systems:  Review of Systems  HENT:         Reports hoarseness has improved since he stopped smoking  Respiratory:         Reports improved breathing since no longer smoking.   Musculoskeletal:  Negative for gait problem.  Neurological:  Negative for tremors.  Psychiatric/Behavioral:         Please refer to HPI    Medications: I have  reviewed the patient's current medications.  Current Outpatient Medications  Medication Sig Dispense Refill   albuterol (PROVENTIL HFA;VENTOLIN HFA) 108 (90 Base) MCG/ACT inhaler Inhale 2 puffs into the lungs every 6 (six) hours as needed for wheezing or shortness of breath. 1 Inhaler 0   diazepam (VALIUM) 5 MG tablet Take 1 tablet (5 mg total) by mouth 2 (two) times daily as needed for anxiety. 60 tablet 5   metFORMIN (GLUCOPHAGE-XR) 500 MG 24 hr tablet Take 1,000 mg by mouth 2 (two) times a day.      NON FORMULARY CPAP AT BEDTIME     rosuvastatin (CRESTOR) 5 MG tablet TK 1 T PO QD FOR CHOLESTEROL     busPIRone (BUSPAR) 15 MG tablet 1 bid 180 tablet 1   EPINEPHrine (EPIPEN 2-PAK) 0.3 mg/0.3 mL IJ SOAJ injection Take 0.3 mg by mouth daily as needed for anaphylaxis.     lithium carbonate 300 MG capsule 1qam and 2qhs 270 capsule 1   tadalafil (CIALIS) 20 MG tablet Take 10-20 mg by mouth daily as needed for erectile dysfunction. (Patient not taking: Reported on 02/11/2021)     venlafaxine XR (EFFEXOR-XR) 150 MG 24 hr capsule TAKE 2 CAPSULES BY MOUTH DAILY  WITH BREAKFAST 180 capsule 3   No current facility-administered  medications for this visit.    Medication Side Effects: None  Allergies:  Allergies  Allergen Reactions   Penicillins Anaphylaxis and Other (See Comments)    Has patient had a PCN reaction causing immediate rash, facial/tongue/throat swelling, SOB or lightheadedness with hypotension:Yes Has patient had a PCN reaction causing severe rash involving mucus membranes or skin necrosis:No Has patient had a PCN reaction that required hospitalization:Treated in ER for reaction Has patient had a PCN reaction occurring within the last 10 years:Yes If all of the above answers are "NO", then may proceed with Cephalosporin use.    Codeine Other (See Comments)    Made pt feel like skin was crawling   Other Other (See Comments)    "bee stings"   Sildenafil Citrate Other (See Comments)    Xanax [Alprazolam]     Made him feel angry    Past Medical History:  Diagnosis Date   Allergy    Anxiety    Cataract    Degenerative disc disease, lumbar    Depression    Diabetes mellitus without complication (HCC)    Diabetes mellitus, type II (HCC)    ED (erectile dysfunction)    GERD (gastroesophageal reflux disease)    Gout    Hypertension    Internal hemorrhoids with prolapse, bleeding 04/24/2014   RLS (restless legs syndrome)    Seizures (HCC)    last seizure around 2016 per patient.   Shortness of breath    Sleep apnea    cpap   Tubular adenoma of colon    multiple, TV adenomas also    Past Medical History, Surgical history, Social history, and Family history were reviewed and updated as appropriate.   Please see review of systems for further details on the patient's review from today.   Objective:   Physical Exam:  There were no vitals taken for this visit.  Physical Exam Constitutional:      General: He is not in acute distress. Musculoskeletal:        General: No deformity.  Neurological:     Mental Status: He is alert and oriented to person, place, and time.     Coordination: Coordination normal.  Psychiatric:        Attention and Perception: Attention and perception normal. He does not perceive auditory or visual hallucinations.        Mood and Affect: Affect is not labile, blunt, angry or inappropriate.        Speech: Speech normal.        Behavior: Behavior normal.        Thought Content: Thought content normal. Thought content is not paranoid or delusional. Thought content does not include homicidal or suicidal ideation. Thought content does not include homicidal or suicidal plan.        Cognition and Memory: Cognition and memory normal.        Judgment: Judgment normal.     Comments: Insight intact Mood is appropriate to content and affect is congruent (sad when discussing upcoming anniversary of son's death and his birthday)     Lab Review:      Component Value Date/Time   NA 137 02/05/2017 0355   K 4.4 02/05/2017 0355   CL 106 02/05/2017 0355   CO2 27 02/05/2017 0355   GLUCOSE 209 (H) 02/05/2017 0355   BUN 15 02/05/2017 0355   CREATININE 0.85 02/05/2017 0355   CALCIUM 8.7 (L) 02/05/2017 0355   PROT 6.8 01/26/2012 0915   ALBUMIN 3.7 01/26/2012 0915  AST 17 01/26/2012 0915   ALT 21 01/26/2012 0915   ALKPHOS 54 01/26/2012 0915   BILITOT 0.3 01/26/2012 0915   GFRNONAA >60 02/05/2017 0355   GFRAA >60 02/05/2017 0355       Component Value Date/Time   WBC 17.3 (H) 02/05/2017 0355   RBC 4.75 02/05/2017 0355   HGB 13.6 02/05/2017 0355   HCT 41.7 02/05/2017 0355   PLT 263 02/05/2017 0355   MCV 87.8 02/05/2017 0355   MCH 28.6 02/05/2017 0355   MCHC 32.6 02/05/2017 0355   RDW 13.6 02/05/2017 0355   LYMPHSABS 2.7 01/26/2012 0915   MONOABS 0.6 01/26/2012 0915   EOSABS 0.1 01/26/2012 0915   BASOSABS 0.0 01/26/2012 0915    No results found for: "POCLITH", "LITHIUM"   No results found for: "PHENYTOIN", "PHENOBARB", "VALPROATE", "CBMZ"    Assessment: Plan:    31 minutes spent dedicated to the care of this patient on the date of this encounter to include pre-visit review of records, ordering of medication, post visit documentation, and face-to-face time with the patient discussing grief in response to upcoming anniversary of son's death and his birthday, as well as recently stopping smoking and his response to Chantix. He reports that he experienced increased irritability with Chantix and took it only briefly. He reports that he was able to stop smoking 05/25/23 and irritability resolved after stopping Chantix. Will continue current medications without changes.  Continue Buspar 15 mg po BID for anxiety.  Continue Lithium 300 mg in the morning and 600 mg at bedtime for mood stabilization.  Continue Effexor XR 300 mg daily for depression and anxiety.  Continue Diazepam 5 mg po BID prn anxiety.  Pt to follow-up in 5 months or  sooner if clinically indicated.  Patient advised to contact office with any questions, adverse effects, or acute worsening in signs and symptoms.   Erik Holmes was seen today for follow-up.  Diagnoses and all orders for this visit:  Anxiety state -     busPIRone (BUSPAR) 15 MG tablet; 1 bid -     venlafaxine XR (EFFEXOR-XR) 150 MG 24 hr capsule; TAKE 2 CAPSULES BY MOUTH DAILY  WITH BREAKFAST  Depression, unspecified depression type -     lithium carbonate 300 MG capsule; 1qam and 2qhs -     venlafaxine XR (EFFEXOR-XR) 150 MG 24 hr capsule; TAKE 2 CAPSULES BY MOUTH DAILY  WITH BREAKFAST     Please see After Visit Summary for patient specific instructions.  Future Appointments  Date Time Provider Department Center  01/02/2024  8:30 AM Corie Chiquito, PMHNP [facility] None    No orders of the defined types were placed in this encounter.   -------------------------------

## 2023-08-11 DIAGNOSIS — R1314 Dysphagia, pharyngoesophageal phase: Secondary | ICD-10-CM | POA: Diagnosis not present

## 2023-08-11 DIAGNOSIS — T17308A Unspecified foreign body in larynx causing other injury, initial encounter: Secondary | ICD-10-CM | POA: Diagnosis not present

## 2023-08-11 DIAGNOSIS — R490 Dysphonia: Secondary | ICD-10-CM | POA: Diagnosis not present

## 2023-08-11 DIAGNOSIS — Z720 Tobacco use: Secondary | ICD-10-CM | POA: Diagnosis not present

## 2023-08-11 DIAGNOSIS — T17328S Food in larynx causing other injury, sequela: Secondary | ICD-10-CM | POA: Diagnosis not present

## 2023-08-11 DIAGNOSIS — J381 Polyp of vocal cord and larynx: Secondary | ICD-10-CM | POA: Diagnosis not present

## 2023-08-11 DIAGNOSIS — F0390 Unspecified dementia without behavioral disturbance: Secondary | ICD-10-CM | POA: Diagnosis not present

## 2023-08-17 ENCOUNTER — Telehealth (HOSPITAL_COMMUNITY): Payer: Self-pay | Admitting: *Deleted

## 2023-08-17 NOTE — Telephone Encounter (Signed)
Attempted to contact patient to schedule OP MBS. Left VM. RKEEL

## 2023-08-19 ENCOUNTER — Other Ambulatory Visit (HOSPITAL_COMMUNITY): Payer: Self-pay | Admitting: *Deleted

## 2023-08-19 DIAGNOSIS — R1310 Dysphagia, unspecified: Secondary | ICD-10-CM

## 2023-08-19 DIAGNOSIS — R059 Cough, unspecified: Secondary | ICD-10-CM

## 2023-08-26 ENCOUNTER — Emergency Department (HOSPITAL_COMMUNITY): Payer: Medicare Other

## 2023-08-26 ENCOUNTER — Other Ambulatory Visit: Payer: Self-pay

## 2023-08-26 ENCOUNTER — Inpatient Hospital Stay (HOSPITAL_COMMUNITY)
Admission: EM | Admit: 2023-08-26 | Discharge: 2023-08-29 | DRG: 184 | Disposition: A | Discharge status: Home / Self Care | Payer: Medicare Other | Attending: Surgery | Admitting: Surgery

## 2023-08-26 ENCOUNTER — Inpatient Hospital Stay (HOSPITAL_COMMUNITY): Payer: Medicare Other

## 2023-08-26 ENCOUNTER — Encounter (HOSPITAL_COMMUNITY): Payer: Self-pay

## 2023-08-26 DIAGNOSIS — W1789XA Other fall from one level to another, initial encounter: Secondary | ICD-10-CM | POA: Diagnosis present

## 2023-08-26 DIAGNOSIS — S22079A Unspecified fracture of T9-T10 vertebra, initial encounter for closed fracture: Secondary | ICD-10-CM | POA: Diagnosis not present

## 2023-08-26 DIAGNOSIS — S22069A Unspecified fracture of T7-T8 vertebra, initial encounter for closed fracture: Secondary | ICD-10-CM | POA: Diagnosis present

## 2023-08-26 DIAGNOSIS — S27892A Contusion of other specified intrathoracic organs, initial encounter: Secondary | ICD-10-CM | POA: Diagnosis present

## 2023-08-26 DIAGNOSIS — Z79899 Other long term (current) drug therapy: Secondary | ICD-10-CM

## 2023-08-26 DIAGNOSIS — S22029A Unspecified fracture of second thoracic vertebra, initial encounter for closed fracture: Secondary | ICD-10-CM | POA: Diagnosis present

## 2023-08-26 DIAGNOSIS — E785 Hyperlipidemia, unspecified: Secondary | ICD-10-CM | POA: Diagnosis not present

## 2023-08-26 DIAGNOSIS — S2242XA Multiple fractures of ribs, left side, initial encounter for closed fracture: Secondary | ICD-10-CM | POA: Diagnosis present

## 2023-08-26 DIAGNOSIS — Z96651 Presence of right artificial knee joint: Secondary | ICD-10-CM | POA: Diagnosis present

## 2023-08-26 DIAGNOSIS — E1165 Type 2 diabetes mellitus with hyperglycemia: Secondary | ICD-10-CM | POA: Diagnosis present

## 2023-08-26 DIAGNOSIS — S22049A Unspecified fracture of fourth thoracic vertebra, initial encounter for closed fracture: Secondary | ICD-10-CM | POA: Diagnosis present

## 2023-08-26 DIAGNOSIS — Z043 Encounter for examination and observation following other accident: Secondary | ICD-10-CM | POA: Diagnosis not present

## 2023-08-26 DIAGNOSIS — W19XXXA Unspecified fall, initial encounter: Principal | ICD-10-CM

## 2023-08-26 DIAGNOSIS — F32A Depression, unspecified: Secondary | ICD-10-CM | POA: Diagnosis present

## 2023-08-26 DIAGNOSIS — T797XXA Traumatic subcutaneous emphysema, initial encounter: Secondary | ICD-10-CM | POA: Diagnosis present

## 2023-08-26 DIAGNOSIS — Z880 Allergy status to penicillin: Secondary | ICD-10-CM

## 2023-08-26 DIAGNOSIS — S270XXA Traumatic pneumothorax, initial encounter: Secondary | ICD-10-CM | POA: Diagnosis present

## 2023-08-26 DIAGNOSIS — Y92008 Other place in unspecified non-institutional (private) residence as the place of occurrence of the external cause: Secondary | ICD-10-CM

## 2023-08-26 DIAGNOSIS — G4733 Obstructive sleep apnea (adult) (pediatric): Secondary | ICD-10-CM | POA: Diagnosis present

## 2023-08-26 DIAGNOSIS — S0990XA Unspecified injury of head, initial encounter: Secondary | ICD-10-CM | POA: Diagnosis not present

## 2023-08-26 DIAGNOSIS — S12690A Other displaced fracture of seventh cervical vertebra, initial encounter for closed fracture: Secondary | ICD-10-CM | POA: Diagnosis not present

## 2023-08-26 DIAGNOSIS — S22059A Unspecified fracture of T5-T6 vertebra, initial encounter for closed fracture: Secondary | ICD-10-CM | POA: Diagnosis not present

## 2023-08-26 DIAGNOSIS — S060XAA Concussion with loss of consciousness status unknown, initial encounter: Secondary | ICD-10-CM | POA: Diagnosis present

## 2023-08-26 DIAGNOSIS — S42032A Displaced fracture of lateral end of left clavicle, initial encounter for closed fracture: Principal | ICD-10-CM

## 2023-08-26 DIAGNOSIS — R9431 Abnormal electrocardiogram [ECG] [EKG]: Secondary | ICD-10-CM | POA: Diagnosis not present

## 2023-08-26 DIAGNOSIS — S12600A Unspecified displaced fracture of seventh cervical vertebra, initial encounter for closed fracture: Secondary | ICD-10-CM | POA: Diagnosis present

## 2023-08-26 DIAGNOSIS — S22039A Unspecified fracture of third thoracic vertebra, initial encounter for closed fracture: Secondary | ICD-10-CM | POA: Diagnosis not present

## 2023-08-26 DIAGNOSIS — R0989 Other specified symptoms and signs involving the circulatory and respiratory systems: Secondary | ICD-10-CM | POA: Diagnosis not present

## 2023-08-26 DIAGNOSIS — E119 Type 2 diabetes mellitus without complications: Secondary | ICD-10-CM | POA: Diagnosis present

## 2023-08-26 DIAGNOSIS — J939 Pneumothorax, unspecified: Secondary | ICD-10-CM | POA: Diagnosis not present

## 2023-08-26 DIAGNOSIS — S22019A Unspecified fracture of first thoracic vertebra, initial encounter for closed fracture: Secondary | ICD-10-CM | POA: Diagnosis not present

## 2023-08-26 DIAGNOSIS — S3993XA Unspecified injury of pelvis, initial encounter: Secondary | ICD-10-CM | POA: Diagnosis not present

## 2023-08-26 DIAGNOSIS — J9690 Respiratory failure, unspecified, unspecified whether with hypoxia or hypercapnia: Secondary | ICD-10-CM | POA: Diagnosis not present

## 2023-08-26 DIAGNOSIS — R413 Other amnesia: Secondary | ICD-10-CM | POA: Diagnosis present

## 2023-08-26 DIAGNOSIS — Z981 Arthrodesis status: Secondary | ICD-10-CM | POA: Diagnosis not present

## 2023-08-26 DIAGNOSIS — Z7984 Long term (current) use of oral hypoglycemic drugs: Secondary | ICD-10-CM | POA: Diagnosis not present

## 2023-08-26 DIAGNOSIS — Z87891 Personal history of nicotine dependence: Secondary | ICD-10-CM | POA: Diagnosis not present

## 2023-08-26 DIAGNOSIS — N281 Cyst of kidney, acquired: Secondary | ICD-10-CM | POA: Diagnosis not present

## 2023-08-26 DIAGNOSIS — S40812A Abrasion of left upper arm, initial encounter: Secondary | ICD-10-CM | POA: Diagnosis present

## 2023-08-26 DIAGNOSIS — S060X0A Concussion without loss of consciousness, initial encounter: Secondary | ICD-10-CM | POA: Diagnosis not present

## 2023-08-26 DIAGNOSIS — K219 Gastro-esophageal reflux disease without esophagitis: Secondary | ICD-10-CM | POA: Diagnosis present

## 2023-08-26 DIAGNOSIS — Z8249 Family history of ischemic heart disease and other diseases of the circulatory system: Secondary | ICD-10-CM

## 2023-08-26 DIAGNOSIS — F419 Anxiety disorder, unspecified: Secondary | ICD-10-CM | POA: Diagnosis present

## 2023-08-26 DIAGNOSIS — I700 Atherosclerosis of aorta: Secondary | ICD-10-CM | POA: Diagnosis not present

## 2023-08-26 DIAGNOSIS — J9811 Atelectasis: Secondary | ICD-10-CM | POA: Diagnosis not present

## 2023-08-26 DIAGNOSIS — Z888 Allergy status to other drugs, medicaments and biological substances status: Secondary | ICD-10-CM

## 2023-08-26 DIAGNOSIS — Z885 Allergy status to narcotic agent status: Secondary | ICD-10-CM

## 2023-08-26 LAB — URINALYSIS, ROUTINE W REFLEX MICROSCOPIC
Bacteria, UA: NONE SEEN
Bilirubin Urine: NEGATIVE
Glucose, UA: >=500 mg/dL — AB
Ketones, ur: NEGATIVE mg/dL
Leukocytes,Ua: NEGATIVE
Nitrite: NEGATIVE
Protein, ur: NEGATIVE mg/dL
Specific Gravity, Urine: 1.032 — ABNORMAL HIGH (ref 1.005–1.030)
pH: 6 (ref 5.0–8.0)

## 2023-08-26 LAB — CBC
HCT: 50.2 % (ref 39.0–52.0)
Hemoglobin: 16.9 g/dL (ref 13.0–17.0)
MCH: 31.1 pg (ref 26.0–34.0)
MCHC: 33.7 g/dL (ref 30.0–36.0)
MCV: 92.4 fL (ref 80.0–100.0)
Platelets: 350 10*3/uL (ref 150–400)
RBC: 5.43 MIL/uL (ref 4.22–5.81)
RDW: 12.8 % (ref 11.5–15.5)
WBC: 21.5 10*3/uL — ABNORMAL HIGH (ref 4.0–10.5)
nRBC: 0 % (ref 0.0–0.2)

## 2023-08-26 LAB — COMPREHENSIVE METABOLIC PANEL
ALT: 30 U/L (ref 0–44)
AST: 42 U/L — ABNORMAL HIGH (ref 15–41)
Albumin: 4.5 g/dL (ref 3.5–5.0)
Alkaline Phosphatase: 42 U/L (ref 38–126)
Anion gap: 10 (ref 5–15)
BUN: 15 mg/dL (ref 8–23)
CO2: 25 mmol/L (ref 22–32)
Calcium: 9.2 mg/dL (ref 8.9–10.3)
Chloride: 97 mmol/L — ABNORMAL LOW (ref 98–111)
Creatinine, Ser: 1.28 mg/dL — ABNORMAL HIGH (ref 0.61–1.24)
GFR, Estimated: >60 mL/min (ref >60)
Glucose, Bld: 426 mg/dL — ABNORMAL HIGH (ref 70–99)
Potassium: 3.7 mmol/L (ref 3.5–5.1)
Sodium: 132 mmol/L — ABNORMAL LOW (ref 135–145)
Total Bilirubin: 0.6 mg/dL (ref 0.3–1.2)
Total Protein: 7.3 g/dL (ref 6.5–8.1)

## 2023-08-26 LAB — I-STAT CHEM 8, ED
BUN: 19 mg/dL (ref 8–23)
Calcium, Ion: 1.1 mmol/L — ABNORMAL LOW (ref 1.15–1.40)
Chloride: 101 mmol/L (ref 98–111)
Creatinine, Ser: 1 mg/dL (ref 0.61–1.24)
Glucose, Bld: 425 mg/dL — ABNORMAL HIGH (ref 70–99)
HCT: 52 % (ref 39.0–52.0)
Hemoglobin: 17.7 g/dL — ABNORMAL HIGH (ref 13.0–17.0)
Potassium: 3.9 mmol/L (ref 3.5–5.1)
Sodium: 134 mmol/L — ABNORMAL LOW (ref 135–145)
TCO2: 21 mmol/L — ABNORMAL LOW (ref 22–32)

## 2023-08-26 LAB — PROTIME-INR
INR: 1 (ref 0.8–1.2)
Prothrombin Time: 13.4 s (ref 11.4–15.2)

## 2023-08-26 LAB — APTT: aPTT: 27 s (ref 24–36)

## 2023-08-26 LAB — ETHANOL: Alcohol, Ethyl (B): <10 mg/dL (ref <10)

## 2023-08-26 LAB — HIV ANTIBODY (ROUTINE TESTING W REFLEX): HIV Screen 4th Generation wRfx: NONREACTIVE

## 2023-08-26 LAB — I-STAT CG4 LACTIC ACID, ED: Lactic Acid, Venous: 2.1 mmol/L (ref 0.5–1.9)

## 2023-08-26 LAB — TYPE AND SCREEN
ABO/RH(D): A POS
Antibody Screen: NEGATIVE

## 2023-08-26 MED ORDER — ONDANSETRON 4 MG PO TBDP
4.0000 mg | ORAL_TABLET | Freq: Four times a day (QID) | ORAL | Status: DC | PRN
  Filled 2023-08-26: qty 1

## 2023-08-26 MED ORDER — OXYCODONE HCL 5 MG PO TABS
5.0000 mg | ORAL_TABLET | ORAL | Status: DC | PRN
  Administered 2023-08-27 – 2023-08-29 (×11): 10 mg via ORAL
  Filled 2023-08-26 (×11): qty 2

## 2023-08-26 MED ORDER — HYDRALAZINE HCL 20 MG/ML IJ SOLN: 10.0000 mg | INTRAMUSCULAR | Status: DC | PRN

## 2023-08-26 MED ORDER — LIDOCAINE 5 % EX PTCH
2.0000 | MEDICATED_PATCH | CUTANEOUS | Status: DC
  Administered 2023-08-26 – 2023-08-28 (×3): 2 via TRANSDERMAL
  Filled 2023-08-26 (×4): qty 2

## 2023-08-26 MED ORDER — IOHEXOL 350 MG/ML SOLN
75.0000 mL | Freq: Once | INTRAVENOUS | Status: AC | PRN
  Administered 2023-08-26: 75 mL via INTRAVENOUS

## 2023-08-26 MED ORDER — POLYETHYLENE GLYCOL 3350 17 G PO PACK
17.0000 g | PACK | Freq: Every day | ORAL | Status: DC | PRN
  Administered 2023-08-27: 17 g via ORAL
  Filled 2023-08-26: qty 1

## 2023-08-26 MED ORDER — ACETAMINOPHEN 500 MG PO TABS
1000.0000 mg | ORAL_TABLET | Freq: Four times a day (QID) | ORAL | Status: DC
  Administered 2023-08-26 – 2023-08-29 (×10): 1000 mg via ORAL
  Filled 2023-08-26 (×11): qty 2

## 2023-08-26 MED ORDER — METHOCARBAMOL 500 MG PO TABS
500.0000 mg | ORAL_TABLET | Freq: Three times a day (TID) | ORAL | Status: DC
  Administered 2023-08-27 – 2023-08-28 (×4): 500 mg via ORAL
  Filled 2023-08-26 (×5): qty 1

## 2023-08-26 MED ORDER — MORPHINE SULFATE (PF) 4 MG/ML IV SOLN
4.0000 mg | INTRAVENOUS | Status: DC | PRN
  Administered 2023-08-26 – 2023-08-28 (×3): 4 mg via INTRAVENOUS
  Filled 2023-08-26 (×3): qty 1

## 2023-08-26 MED ORDER — METHOCARBAMOL 1000 MG/10ML IJ SOLN
500.0000 mg | Freq: Three times a day (TID) | INTRAVENOUS | Status: DC
  Filled 2023-08-26: qty 5

## 2023-08-26 MED ORDER — DOCUSATE SODIUM 100 MG PO CAPS
100.0000 mg | ORAL_CAPSULE | Freq: Two times a day (BID) | ORAL | Status: DC
  Administered 2023-08-27 – 2023-08-29 (×5): 100 mg via ORAL
  Filled 2023-08-26 (×5): qty 1

## 2023-08-26 MED ORDER — KETOROLAC TROMETHAMINE 15 MG/ML IJ SOLN
30.0000 mg | Freq: Four times a day (QID) | INTRAMUSCULAR | Status: DC
  Administered 2023-08-26 – 2023-08-29 (×11): 30 mg via INTRAVENOUS
  Filled 2023-08-26 (×11): qty 2

## 2023-08-26 MED ORDER — METOPROLOL TARTRATE 5 MG/5ML IV SOLN: 5.0000 mg | Freq: Four times a day (QID) | INTRAVENOUS | Status: DC | PRN

## 2023-08-26 MED ORDER — ONDANSETRON HCL 4 MG/2ML IJ SOLN
4.0000 mg | Freq: Four times a day (QID) | INTRAMUSCULAR | Status: DC | PRN
  Administered 2023-08-27: 4 mg via INTRAVENOUS
  Filled 2023-08-26: qty 2

## 2023-08-26 MED ORDER — ENOXAPARIN SODIUM 30 MG/0.3ML IJ SOSY
30.0000 mg | PREFILLED_SYRINGE | Freq: Two times a day (BID) | INTRAMUSCULAR | Status: DC
  Administered 2023-08-27 – 2023-08-29 (×5): 30 mg via SUBCUTANEOUS
  Filled 2023-08-26 (×5): qty 0.3

## 2023-08-26 NOTE — H&P (Incomplete)
Reason for Consult/Chief Complaint: PTX Consultant: Criss Alvine, MD  Trenton Gammon. is an 62 y.o. male.   HPI: 23M s/p FFH off a porch. Amnestic to the event. C/o pain wherever actively being touched/examined. Two children, Homero Fellers and [place] at bedside.   Past Medical History:  Diagnosis Date   Allergy    Anxiety    Cataract    Degenerative disc disease, lumbar    Depression    Diabetes mellitus without complication (HCC)    Diabetes mellitus, type II (HCC)    ED (erectile dysfunction)    GERD (gastroesophageal reflux disease)    Gout    Hypertension    Internal hemorrhoids with prolapse, bleeding 04/24/2014   RLS (restless legs syndrome)    Seizures (HCC)    last seizure around 2016 per patient.   Shortness of breath    Sleep apnea    cpap   Tubular adenoma of colon    multiple, TV adenomas also    Past Surgical History:  Procedure Laterality Date   ANTERIOR LAT LUMBAR FUSION Left 04/30/2013   Procedure: ANTERIOR LATERAL LUMBAR FUSION 1 LEVEL;  Surgeon: Maeola Harman, MD;  Location: [facility];  Service: Neurosurgery;  Laterality: Left;  Lumbar three-four  Anterolateral decompression/fusion/percutaneous pedicle screws    BACK SURGERY     CARDIAC CATHETERIZATION  1998   90's  neg   cataract surgery Bilateral    COLONOSCOPY  multiple   Dr. Karma Lew   EYE SURGERY     cataracts bilateral with lens placement   hemorrhoid banding  2015   LUMBAR PERCUTANEOUS PEDICLE SCREW 1 LEVEL N/A 04/30/2013   Procedure: LUMBAR PERCUTANEOUS PEDICLE SCREW 1 LEVEL;  Surgeon: Maeola Harman, MD;  Location: [facility];  Service: Neurosurgery;  Laterality: N/A;  Lumbar three-four  Anterolateral decompression/fusion/percutaneous pedicle screws   SPINE SURGERY  2008   lumbar fusion   TOTAL KNEE ARTHROPLASTY Right 02/03/2017   Procedure: RIGHT TOTAL KNEE ARTHROPLASTY;  Surgeon: Eugenia Mcalpine, MD;  Location: [facility];  Service: Orthopedics;  Laterality: Right;    Family History   Problem Relation Age of Onset   Cancer Mother    Breast cancer Mother    Osteoporosis Mother    Heart disease Father    Colon cancer Neg Hx    Esophageal cancer Neg Hx    Liver cancer Neg Hx    Pancreatic cancer Neg Hx    Stomach cancer Neg Hx    Rectal cancer Neg Hx     Social History:  reports that he quit smoking about 9 years ago. His smoking use included cigarettes. He started smoking about 49 years ago. He has a 80 pack-year smoking history. He has never used smokeless tobacco. He reports current drug use. Drug: Marijuana. He reports that he does not drink alcohol.  Allergies:  Allergies  Allergen Reactions   Penicillins Anaphylaxis and Other (See Comments)    Has patient had a PCN reaction causing immediate rash, facial/tongue/throat swelling, SOB or lightheadedness with hypotension:Yes Has patient had a PCN reaction causing severe rash involving mucus membranes or skin necrosis:No Has patient had a PCN reaction that required hospitalization:Treated in ER for reaction Has patient had a PCN reaction occurring within the last 10 years:Yes If all of the above answers are "NO", then may proceed with Cephalosporin use.    Codeine Other (See Comments)    Made pt feel like skin was crawling   Other Other (See Comments)    "  bee stings"   Sildenafil Citrate Other (See Comments)   Xanax [Alprazolam]     Made him feel angry    Medications: I have reviewed the patient's current medications.  Results for orders placed or performed during the hospital encounter of 08/26/23 (from the past 48 hour(s))  APTT     Status: None   Collection Time: 08/26/23  5:40 PM  Result Value Ref Range   aPTT 27 24 - 36 seconds    Comment: Performed at [facility], [address], [place], [place] [address]  Comprehensive metabolic panel     Status: Abnormal   Collection Time: 08/26/23  5:40 PM  Result Value Ref Range   Sodium 132 (L) 135 - 145 mmol/L   Potassium 3.7 3.5 - 5.1 mmol/L    Chloride 97 (L) 98 - 111 mmol/L   CO2 25 22 - 32 mmol/L   Glucose, Bld 426 (H) 70 - 99 mg/dL    Comment: Glucose reference range applies only to samples taken after fasting for at least 8 hours.   BUN 15 8 - 23 mg/dL   Creatinine, Ser 6.04 (H) 0.61 - 1.24 mg/dL   Calcium 9.2 8.9 - 54.0 mg/dL   Total Protein 7.3 6.5 - 8.1 g/dL   Albumin 4.5 3.5 - 5.0 g/dL   AST 42 (H) 15 - 41 U/L   ALT 30 0 - 44 U/L   Alkaline Phosphatase 42 38 - 126 U/L   Total Bilirubin 0.6 0.3 - 1.2 mg/dL   GFR, Estimated >98 >11 mL/min    Comment: (NOTE) Calculated using the CKD-EPI Creatinine Equation (2021)    Anion gap 10 5 - 15    Comment: Performed at [facility], [address], [place], [place] [address]  CBC     Status: Abnormal   Collection Time: 08/26/23  5:40 PM  Result Value Ref Range   WBC 21.5 (H) 4.0 - 10.5 K/uL   RBC 5.43 4.22 - 5.81 MIL/uL   Hemoglobin 16.9 13.0 - 17.0 g/dL   HCT 29.5 62.1 - 30.8 %   MCV 92.4 80.0 - 100.0 fL   MCH 31.1 26.0 - 34.0 pg   MCHC 33.7 30.0 - 36.0 g/dL   RDW 65.7 84.6 - 96.2 %   Platelets 350 150 - 400 K/uL   nRBC 0.0 0.0 - 0.2 %    Comment: Performed at [facility], [address], [place], [place] [address]  Ethanol     Status: None   Collection Time: 08/26/23  5:40 PM  Result Value Ref Range   Alcohol, Ethyl (B) <10 <10 mg/dL    Comment: (NOTE) Lowest detectable limit for serum alcohol is 10 mg/dL.  For medical purposes only. Performed at [facility], [address] [place], [place] [address]   Protime-INR     Status: None   Collection Time: 08/26/23  5:40 PM  Result Value Ref Range   Prothrombin Time 13.4 11.4 - 15.2 seconds   INR 1.0 0.8 - 1.2    Comment: (NOTE) INR goal varies based on device and disease states. Performed at [facility], [address], [place], [place] [address]   Type and screen [facility]     Status: None   Collection Time: 08/26/23  5:50 PM  Result Value Ref Range    ABO/RH(D) A POS    Antibody Screen NEG    Sample Expiration  08/29/2023,2359 Performed at [facility], [address], [place], [place] [address]   I-Stat CG4 Lactic Acid, ED     Status: Abnormal   Collection Time: 08/26/23  5:56 PM  Result Value Ref Range   Lactic Acid, Venous 2.1 (HH) 0.5 - 1.9 mmol/L   Comment NOTIFIED PHYSICIAN   I-stat chem 8, ed     Status: Abnormal   Collection Time: 08/26/23  5:56 PM  Result Value Ref Range   Sodium 134 (L) 135 - 145 mmol/L   Potassium 3.9 3.5 - 5.1 mmol/L   Chloride 101 98 - 111 mmol/L   BUN 19 8 - 23 mg/dL   Creatinine, Ser 8.46 0.61 - 1.24 mg/dL   Glucose, Bld 962 (H) 70 - 99 mg/dL    Comment: Glucose reference range applies only to samples taken after fasting for at least 8 hours.   Calcium, Ion 1.10 (L) 1.15 - 1.40 mmol/L   TCO2 21 (L) 22 - 32 mmol/L   Hemoglobin 17.7 (H) 13.0 - 17.0 g/dL   HCT 95.2 84.1 - 32.4 %    CT CHEST ABDOMEN PELVIS W CONTRAST  Result Date: 08/26/2023 CLINICAL DATA:  Trauma, fall from deck 20 feet fall, abrasions to left flank EXAM: CT CHEST, ABDOMEN, AND PELVIS WITH CONTRAST CT THORACIC AND LUMBAR SPINE WITH CONTRAST TECHNIQUE: Multidetector CT imaging of the chest, abdomen and pelvis was performed following the standard protocol during bolus administration of intravenous contrast. Multidetector CT imaging of the thoracic and lumbar spine was performed following the standard protocol during bolus administration of intravenous contrast. RADIATION DOSE REDUCTION: This exam was performed according to the departmental dose-optimization program which includes automated exposure control, adjustment of the mA and/or kV according to patient size and/or use of iterative reconstruction technique. CONTRAST:  75mL OMNIPAQUE IOHEXOL 350 MG/ML SOLN COMPARISON:  None Available. FINDINGS: CT CHEST FINDINGS Cardiovascular: Aortic atherosclerosis. Heterogeneous, intermediate attenuation lesion in the anterior mediastinum  overlying the ascending aorta and pulmonary outflow tract, measuring 3.5 x 2.7 cm with adjacent fat stranding (series 3, image 33). Normal heart size. No pericardial effusion. Mediastinum/Nodes: Pneumomediastinum in the anterior and superior mediastinum (series 4, image 62). No enlarged mediastinal, hilar, or axillary lymph nodes. Thyroid gland, trachea, and esophagus demonstrate no significant findings. Lungs/Pleura: Small left pneumothorax, no greater than 10% volume. Small left pleural effusion. Musculoskeletal: No chest wall mass or suspicious osseous lesions identified. Mildly displaced, comminuted fractures of the distal third of the left clavicle (series 3, image 5). Multiple minimally displaced fractures of the left first through fifth ribs. Subcutaneous emphysema about the left chest wall. CT ABDOMEN PELVIS FINDINGS Hepatobiliary: No solid liver abnormality is seen. No gallstones, gallbladder wall thickening, or biliary dilatation. Pancreas: Unremarkable. No pancreatic ductal dilatation or surrounding inflammatory changes. Spleen: Normal in size without significant abnormality. Adrenals/Urinary Tract: Adrenal glands are unremarkable. Simple, benign right parapelvic renal cysts, for which no further follow-up or characterization is required. Kidneys are otherwise normal, without renal calculi, solid lesion, or hydronephrosis. Bladder is unremarkable. Stomach/Bowel: Stomach is within normal limits. Appendix appears diminutive although otherwise normal. No evidence of bowel wall thickening, distention, or inflammatory changes. Moderate burden of stool and stool balls throughout the colon and rectum. Vascular/Lymphatic: Aortic atherosclerosis. No enlarged abdominal or pelvic lymph nodes. Reproductive: No mass or other abnormality. Other: Small, fat containing bilateral inguinal hernias. No ascites. Musculoskeletal: No acute osseous findings. CT THORACIC AND LUMBAR SPINE FINDINGS Alignment: Normal thoracic  kyphosis. Normal lumbar lordosis. Vertebral bodies: Multiple  nondisplaced and mildly displaced fractures of the left thoracic transverse processes, involving at least T1 through T9. No fractures of the vertebral bodies proper or other posterior elements. Disc spaces: Status post lumbar discectomy and fusion of L3-L5. Mild disc space height loss and osteophytosis throughout the thoracic spine. Remaining anatomic lumbar disc spaces are generally preserved. Paraspinous soft tissues: Unremarkable. IMPRESSION: 1. Multiple minimally displaced fractures of the left first through fifth ribs as well as multiple nondisplaced and mildly displaced fractures of the left thoracic transverse processes, involving at least T1 through T9. 2. Mildly displaced, comminuted fractures of the distal third of the left clavicle. 3. Small left pneumothorax, no greater than 10% volume. 4. Small simple fluid attenuation of the left pleural effusion. 5. Pneumomediastinum and subcutaneous emphysema about the left chest. 6. Heterogeneous, intermediate attenuation lesion in the anterior mediastinum overlying the ascending aorta and pulmonary outflow tract, measuring 3.5 x 2.7 cm with adjacent fat stranding. This is most consistent with a hematoma, although of uncertain origin, and appears to be separated from the aortic arch by a fat plane. Recommend vascular consultation and short interval follow-up to assess for stability. 7. No CT evidence of acute traumatic injury to the abdomen or pelvis. These results were called by telephone at the time of interpretation on 08/26/2023 at 7:15 pm to Dr. Pricilla Loveless , who verbally acknowledged these results. Aortic Atherosclerosis (ICD10-I70.0). Electronically Signed   By: Jearld Lesch M.D.   On: 08/26/2023 19:22   CT T-SPINE NO CHARGE  Result Date: 08/26/2023 CLINICAL DATA:  Trauma, fall from deck 20 feet fall, abrasions to left flank EXAM: CT CHEST, ABDOMEN, AND PELVIS WITH CONTRAST CT THORACIC AND  LUMBAR SPINE WITH CONTRAST TECHNIQUE: Multidetector CT imaging of the chest, abdomen and pelvis was performed following the standard protocol during bolus administration of intravenous contrast. Multidetector CT imaging of the thoracic and lumbar spine was performed following the standard protocol during bolus administration of intravenous contrast. RADIATION DOSE REDUCTION: This exam was performed according to the departmental dose-optimization program which includes automated exposure control, adjustment of the mA and/or kV according to patient size and/or use of iterative reconstruction technique. CONTRAST:  75mL OMNIPAQUE IOHEXOL 350 MG/ML SOLN COMPARISON:  None Available. FINDINGS: CT CHEST FINDINGS Cardiovascular: Aortic atherosclerosis. Heterogeneous, intermediate attenuation lesion in the anterior mediastinum overlying the ascending aorta and pulmonary outflow tract, measuring 3.5 x 2.7 cm with adjacent fat stranding (series 3, image 33). Normal heart size. No pericardial effusion. Mediastinum/Nodes: Pneumomediastinum in the anterior and superior mediastinum (series 4, image 62). No enlarged mediastinal, hilar, or axillary lymph nodes. Thyroid gland, trachea, and esophagus demonstrate no significant findings. Lungs/Pleura: Small left pneumothorax, no greater than 10% volume. Small left pleural effusion. Musculoskeletal: No chest wall mass or suspicious osseous lesions identified. Mildly displaced, comminuted fractures of the distal third of the left clavicle (series 3, image 5). Multiple minimally displaced fractures of the left first through fifth ribs. Subcutaneous emphysema about the left chest wall. CT ABDOMEN PELVIS FINDINGS Hepatobiliary: No solid liver abnormality is seen. No gallstones, gallbladder wall thickening, or biliary dilatation. Pancreas: Unremarkable. No pancreatic ductal dilatation or surrounding inflammatory changes. Spleen: Normal in size without significant abnormality. Adrenals/Urinary  Tract: Adrenal glands are unremarkable. Simple, benign right parapelvic renal cysts, for which no further follow-up or characterization is required. Kidneys are otherwise normal, without renal calculi, solid lesion, or hydronephrosis. Bladder is unremarkable. Stomach/Bowel: Stomach is within normal limits. Appendix appears diminutive although otherwise normal. No evidence of bowel wall thickening,  distention, or inflammatory changes. Moderate burden of stool and stool balls throughout the colon and rectum. Vascular/Lymphatic: Aortic atherosclerosis. No enlarged abdominal or pelvic lymph nodes. Reproductive: No mass or other abnormality. Other: Small, fat containing bilateral inguinal hernias. No ascites. Musculoskeletal: No acute osseous findings. CT THORACIC AND LUMBAR SPINE FINDINGS Alignment: Normal thoracic kyphosis. Normal lumbar lordosis. Vertebral bodies: Multiple nondisplaced and mildly displaced fractures of the left thoracic transverse processes, involving at least T1 through T9. No fractures of the vertebral bodies proper or other posterior elements. Disc spaces: Status post lumbar discectomy and fusion of L3-L5. Mild disc space height loss and osteophytosis throughout the thoracic spine. Remaining anatomic lumbar disc spaces are generally preserved. Paraspinous soft tissues: Unremarkable. IMPRESSION: 1. Multiple minimally displaced fractures of the left first through fifth ribs as well as multiple nondisplaced and mildly displaced fractures of the left thoracic transverse processes, involving at least T1 through T9. 2. Mildly displaced, comminuted fractures of the distal third of the left clavicle. 3. Small left pneumothorax, no greater than 10% volume. 4. Small simple fluid attenuation of the left pleural effusion. 5. Pneumomediastinum and subcutaneous emphysema about the left chest. 6. Heterogeneous, intermediate attenuation lesion in the anterior mediastinum overlying the ascending aorta and pulmonary  outflow tract, measuring 3.5 x 2.7 cm with adjacent fat stranding. This is most consistent with a hematoma, although of uncertain origin, and appears to be separated from the aortic arch by a fat plane. Recommend vascular consultation and short interval follow-up to assess for stability. 7. No CT evidence of acute traumatic injury to the abdomen or pelvis. These results were called by telephone at the time of interpretation on 08/26/2023 at 7:15 pm to Dr. Pricilla Loveless , who verbally acknowledged these results. Aortic Atherosclerosis (ICD10-I70.0). Electronically Signed   By: Jearld Lesch M.D.   On: 08/26/2023 19:22   CT L-SPINE NO CHARGE  Result Date: 08/26/2023 CLINICAL DATA:  Trauma, fall from deck 20 feet fall, abrasions to left flank EXAM: CT CHEST, ABDOMEN, AND PELVIS WITH CONTRAST CT THORACIC AND LUMBAR SPINE WITH CONTRAST TECHNIQUE: Multidetector CT imaging of the chest, abdomen and pelvis was performed following the standard protocol during bolus administration of intravenous contrast. Multidetector CT imaging of the thoracic and lumbar spine was performed following the standard protocol during bolus administration of intravenous contrast. RADIATION DOSE REDUCTION: This exam was performed according to the departmental dose-optimization program which includes automated exposure control, adjustment of the mA and/or kV according to patient size and/or use of iterative reconstruction technique. CONTRAST:  75mL OMNIPAQUE IOHEXOL 350 MG/ML SOLN COMPARISON:  None Available. FINDINGS: CT CHEST FINDINGS Cardiovascular: Aortic atherosclerosis. Heterogeneous, intermediate attenuation lesion in the anterior mediastinum overlying the ascending aorta and pulmonary outflow tract, measuring 3.5 x 2.7 cm with adjacent fat stranding (series 3, image 33). Normal heart size. No pericardial effusion. Mediastinum/Nodes: Pneumomediastinum in the anterior and superior mediastinum (series 4, image 62). No enlarged mediastinal,  hilar, or axillary lymph nodes. Thyroid gland, trachea, and esophagus demonstrate no significant findings. Lungs/Pleura: Small left pneumothorax, no greater than 10% volume. Small left pleural effusion. Musculoskeletal: No chest wall mass or suspicious osseous lesions identified. Mildly displaced, comminuted fractures of the distal third of the left clavicle (series 3, image 5). Multiple minimally displaced fractures of the left first through fifth ribs. Subcutaneous emphysema about the left chest wall. CT ABDOMEN PELVIS FINDINGS Hepatobiliary: No solid liver abnormality is seen. No gallstones, gallbladder wall thickening, or biliary dilatation. Pancreas: Unremarkable. No pancreatic ductal dilatation or surrounding  inflammatory changes. Spleen: Normal in size without significant abnormality. Adrenals/Urinary Tract: Adrenal glands are unremarkable. Simple, benign right parapelvic renal cysts, for which no further follow-up or characterization is required. Kidneys are otherwise normal, without renal calculi, solid lesion, or hydronephrosis. Bladder is unremarkable. Stomach/Bowel: Stomach is within normal limits. Appendix appears diminutive although otherwise normal. No evidence of bowel wall thickening, distention, or inflammatory changes. Moderate burden of stool and stool balls throughout the colon and rectum. Vascular/Lymphatic: Aortic atherosclerosis. No enlarged abdominal or pelvic lymph nodes. Reproductive: No mass or other abnormality. Other: Small, fat containing bilateral inguinal hernias. No ascites. Musculoskeletal: No acute osseous findings. CT THORACIC AND LUMBAR SPINE FINDINGS Alignment: Normal thoracic kyphosis. Normal lumbar lordosis. Vertebral bodies: Multiple nondisplaced and mildly displaced fractures of the left thoracic transverse processes, involving at least T1 through T9. No fractures of the vertebral bodies proper or other posterior elements. Disc spaces: Status post lumbar discectomy and  fusion of L3-L5. Mild disc space height loss and osteophytosis throughout the thoracic spine. Remaining anatomic lumbar disc spaces are generally preserved. Paraspinous soft tissues: Unremarkable. IMPRESSION: 1. Multiple minimally displaced fractures of the left first through fifth ribs as well as multiple nondisplaced and mildly displaced fractures of the left thoracic transverse processes, involving at least T1 through T9. 2. Mildly displaced, comminuted fractures of the distal third of the left clavicle. 3. Small left pneumothorax, no greater than 10% volume. 4. Small simple fluid attenuation of the left pleural effusion. 5. Pneumomediastinum and subcutaneous emphysema about the left chest. 6. Heterogeneous, intermediate attenuation lesion in the anterior mediastinum overlying the ascending aorta and pulmonary outflow tract, measuring 3.5 x 2.7 cm with adjacent fat stranding. This is most consistent with a hematoma, although of uncertain origin, and appears to be separated from the aortic arch by a fat plane. Recommend vascular consultation and short interval follow-up to assess for stability. 7. No CT evidence of acute traumatic injury to the abdomen or pelvis. These results were called by telephone at the time of interpretation on 08/26/2023 at 7:15 pm to Dr. Pricilla Loveless , who verbally acknowledged these results. Aortic Atherosclerosis (ICD10-I70.0). Electronically Signed   By: Jearld Lesch M.D.   On: 08/26/2023 19:22   DG Pelvis Portable  Result Date: 08/26/2023 CLINICAL DATA:  trauma EXAM: PORTABLE PELVIS 1-2 VIEWS COMPARISON:  CT abdomen pelvis 08/26/2023 FINDINGS: There is no evidence of pelvic fracture or diastasis. No acute displaced fracture or dislocation of either hips on frontal view. Visualized lower lumbar spine surgical hardware. No pelvic bone lesions are seen. IMPRESSION: Negative for acute traumatic injury. Electronically Signed   By: Tish Frederickson M.D.   On: 08/26/2023 19:18   DG  Chest Portable 1 View  Result Date: 08/26/2023 CLINICAL DATA:  trauma.  Fall off deck about 20 feet. EXAM: PORTABLE CHEST 1 VIEW COMPARISON:  CT chest 08/26/2023 FINDINGS: The heart and mediastinal contours are within normal limits. Atherosclerotic plaque. No focal consolidation. No pulmonary edema. No pleural effusion. Known left pneumothorax better evaluated on CT chest 08/26/2023. No right pneumothorax. Multiple acute left rib fractures better evaluated on CT chest 08/26/2023. Acute minimally displaced left T1 transverse process fracture. Acute comminuted and displaced distal left clavicular fracture. IMPRESSION: 1. Known trace to small volume left pneumothorax better evaluated on CT chest 08/26/2023. 2. Multiple acute left rib fractures better evaluated on CT chest 08/26/2023. 3. Acute minimally displaced left T1 transverse process fracture. 4. Acute comminuted and displaced distal left clavicular fracture. 5.  Aortic Atherosclerosis (ICD10-I70.0). These  results were called by telephone at the time of interpretation on 08/26/2023 at 7:08 pm to provider Pricilla Loveless , who verbally acknowledged these results. Electronically Signed   By: Tish Frederickson M.D.   On: 08/26/2023 19:16   CT Head Wo Contrast  Result Date: 08/26/2023 CLINICAL DATA:  Head trauma, GCS<=14 (Ped 0-17y); Polytrauma, blunt EXAM: CT HEAD WITHOUT CONTRAST CT CERVICAL SPINE WITHOUT CONTRAST TECHNIQUE: Multidetector CT imaging of the head and cervical spine was performed following the standard protocol without intravenous contrast. Multiplanar CT image reconstructions of the cervical spine were also generated. RADIATION DOSE REDUCTION: This exam was performed according to the departmental dose-optimization program which includes automated exposure control, adjustment of the mA and/or kV according to patient size and/or use of iterative reconstruction technique. COMPARISON:  None Available. FINDINGS: CT HEAD FINDINGS Brain: No evidence of  large-territorial acute infarction. No parenchymal hemorrhage. No mass lesion. No extra-axial collection. No mass effect or midline shift. No hydrocephalus. Basilar cisterns are patent. Vascular: No hyperdense vessel. Skull: No acute fracture or focal lesion. Sinuses/Orbits: Paranasal sinuses and mastoid air cells are clear. Bilateral lens replacement. Otherwise the orbits are unremarkable. Other: None. CT CERVICAL SPINE FINDINGS Alignment: Normal. Skull base and vertebrae: Acute displaced left C7 transverse process fracture. No aggressive appearing focal osseous lesion or focal pathologic process. Soft tissues and spinal canal: No prevertebral fluid or swelling. No visible canal hematoma. Upper chest: Trace apical left pneumothorax. Other: Acute displaced left T1 transverse process fracture. Acute displaced posterior 1-3 left rib fracture. Subcutaneus soft tissue emphysema of the left neck and visualized shoulder/back. Acute displaced and comminuted fractured left distal clavicle. IMPRESSION: 1. No acute intracranial abnormality. 2. Acute displaced left C7 transverse process fracture. 3. Acute displaced left T1 transverse process fracture. 4. Acute displaced posterior 1-3 left rib fracture. 5. Trace apical left pneumothorax. 6. Acute displaced and comminuted fractured left distal clavicle. 7. Please see separately dictated CT chest and CT thoracic spine 08/26/2023. These results were called by telephone at the time of interpretation on 08/26/2023 at 7:08 pm to provider Pricilla Loveless , who verbally acknowledged these results. Electronically Signed   By: Tish Frederickson M.D.   On: 08/26/2023 19:10   CT CERVICAL SPINE WO CONTRAST  Result Date: 08/26/2023 CLINICAL DATA:  Head trauma, GCS<=14 (Ped 0-17y); Polytrauma, blunt EXAM: CT HEAD WITHOUT CONTRAST CT CERVICAL SPINE WITHOUT CONTRAST TECHNIQUE: Multidetector CT imaging of the head and cervical spine was performed following the standard protocol without  intravenous contrast. Multiplanar CT image reconstructions of the cervical spine were also generated. RADIATION DOSE REDUCTION: This exam was performed according to the departmental dose-optimization program which includes automated exposure control, adjustment of the mA and/or kV according to patient size and/or use of iterative reconstruction technique. COMPARISON:  None Available. FINDINGS: CT HEAD FINDINGS Brain: No evidence of large-territorial acute infarction. No parenchymal hemorrhage. No mass lesion. No extra-axial collection. No mass effect or midline shift. No hydrocephalus. Basilar cisterns are patent. Vascular: No hyperdense vessel. Skull: No acute fracture or focal lesion. Sinuses/Orbits: Paranasal sinuses and mastoid air cells are clear. Bilateral lens replacement. Otherwise the orbits are unremarkable. Other: None. CT CERVICAL SPINE FINDINGS Alignment: Normal. Skull base and vertebrae: Acute displaced left C7 transverse process fracture. No aggressive appearing focal osseous lesion or focal pathologic process. Soft tissues and spinal canal: No prevertebral fluid or swelling. No visible canal hematoma. Upper chest: Trace apical left pneumothorax. Other: Acute displaced left T1 transverse process fracture. Acute displaced posterior 1-3 left rib  fracture. Subcutaneus soft tissue emphysema of the left neck and visualized shoulder/back. Acute displaced and comminuted fractured left distal clavicle. IMPRESSION: 1. No acute intracranial abnormality. 2. Acute displaced left C7 transverse process fracture. 3. Acute displaced left T1 transverse process fracture. 4. Acute displaced posterior 1-3 left rib fracture. 5. Trace apical left pneumothorax. 6. Acute displaced and comminuted fractured left distal clavicle. 7. Please see separately dictated CT chest and CT thoracic spine 08/26/2023. These results were called by telephone at the time of interpretation on 08/26/2023 at 7:08 pm to provider Pricilla Loveless ,  who verbally acknowledged these results. Electronically Signed   By: Tish Frederickson M.D.   On: 08/26/2023 19:10    ROS 10 point review of systems is negative except as listed above in HPI.   Physical Exam Blood pressure (!) 167/93, pulse 98, temperature 97.6 F (36.4 C), temperature source Oral, resp. rate (!) 23, height 5\' 10"  (1.778 m), weight 95.3 kg, SpO2 92%. Constitutional: well-developed, well-nourished HEENT: pupils equal, round, reactive to light, 2mm b/l, moist conjunctiva, external inspection of ears and nose normal, hearing intact Oropharynx: normal oropharyngeal mucosa, normal dentition Neck: no thyromegaly, trachea midline, no midline cervical tenderness to palpation Chest: breath sounds equal bilaterally, normal respiratory effort, + L lateral chest wall tenderness to palpation without deformity Abdomen: soft, NT, no bruising, no hepatosplenomegaly Extremities: 2+ radial and pedal pulses bilaterally, intact motor and sensation bilateral UE and LE, no peripheral edema MSK: unable to assess gait/station, no clubbing/cyanosis of fingers/toes, normal ROM of all four extremities Skin: warm, dry, no rashes Psych:  confused      Assessment/Plan: [facility]  Comminuted and displaced L clavicle fx - ortho c/s, Dr. Christell Constant, notified at 1928 L C7 TP fx - NSGY c/s, Dr. Jordan Likes, notified at 1936, maintain c-collar   L T1-9 TP fx - pain control L rib fx 1-5 - pain control, pulm toilet L PTX with SQE - short interval CXR at 2200 and repeat in AM Mediastinal hematoma - EKG, tele Concussion - SLP eval FEN - regular diet DVT - SCDs, LMWH Dispo - med-surg    Diamantina Monks, MD General and Trauma Surgery [facility] Surgery

## 2023-08-26 NOTE — ED Triage Notes (Signed)
Pt fell off deck about 20 feet head first. +LOC. Pt confused. Abrasions noted to left flank, left elbow and left side of head

## 2023-08-26 NOTE — ED Notes (Signed)
ED TO INPATIENT HANDOFF REPORT  ED Nurse Name and Phone #: Molli Hazard #[phone]  S Name/Age/Gender Erik Holmes. 62 y.o. male Room/Bed: 6N16C/6N16C-01  Code Status   Code Status: Full Code  Home/SNF/Other Home Patient oriented to: self Is this baseline? No   Triage Complete: Triage complete  Chief Complaint Traumatic pneumothorax, initial encounter [S27.0XXA] Fall, initial encounter [id number]]  Triage Note Pt fell off deck about 20 feet head first. +LOC. Pt confused. Abrasions noted to left flank, left elbow and left side of head    Allergies Allergies  Allergen Reactions   Penicillins Anaphylaxis and Other (See Comments)    Has patient had a PCN reaction causing immediate rash, facial/tongue/throat swelling, SOB or lightheadedness with hypotension:Yes Has patient had a PCN reaction causing severe rash involving mucus membranes or skin necrosis:No Has patient had a PCN reaction that required hospitalization:Treated in ER for reaction Has patient had a PCN reaction occurring within the last 10 years:Yes If all of the above answers are "NO", then may proceed with Cephalosporin use.    Codeine Other (See Comments)    Made pt feel like skin was crawling   Other Other (See Comments)    "bee stings"   Sildenafil Citrate Other (See Comments)   Xanax [Alprazolam]     Made him feel angry    Level of Care/Admitting Diagnosis ED Disposition     ED Disposition  Admit   Condition  --   Comment  Hospital Area: [facility] [[id number]]  Level of Care: Med-Surg [16]  May admit patient to [facility] or [facility] if equivalent level of care is available:: No  Covid Evaluation: Asymptomatic - no recent exposure (last 10 days) testing not required  Diagnosis: Traumatic pneumothorax, initial encounter [409811]  Admitting Physician: TRAUMA MD [[id number]]  Attending Physician: TRAUMA MD [[id number]]  Certification:: I certify this patient will need inpatient services for  at least 2 midnights  Estimated Length of Stay: 9          B Medical/Surgery History Past Medical History:  Diagnosis Date   Allergy    Anxiety    Cataract    Degenerative disc disease, lumbar    Depression    Diabetes mellitus without complication (HCC)    Diabetes mellitus, type II (HCC)    ED (erectile dysfunction)    GERD (gastroesophageal reflux disease)    Gout    Hypertension    Internal hemorrhoids with prolapse, bleeding 04/24/2014   RLS (restless legs syndrome)    Seizures (HCC)    last seizure around 2016 per patient.   Shortness of breath    Sleep apnea    cpap   Tubular adenoma of colon    multiple, TV adenomas also   Past Surgical History:  Procedure Laterality Date   ANTERIOR LAT LUMBAR FUSION Left 04/30/2013   Procedure: ANTERIOR LATERAL LUMBAR FUSION 1 LEVEL;  Surgeon: Maeola Harman, MD;  Location: [facility];  Service: Neurosurgery;  Laterality: Left;  Lumbar three-four  Anterolateral decompression/fusion/percutaneous pedicle screws    BACK SURGERY     CARDIAC CATHETERIZATION  1998   90's  neg   cataract surgery Bilateral    COLONOSCOPY  multiple   Dr. Karma Lew   EYE SURGERY     cataracts bilateral with lens placement   hemorrhoid banding  2015   LUMBAR PERCUTANEOUS PEDICLE SCREW 1 LEVEL N/A 04/30/2013   Procedure: LUMBAR PERCUTANEOUS PEDICLE SCREW 1 LEVEL;  Surgeon: Maeola Harman, MD;  Location: [facility];  Service: Neurosurgery;  Laterality: N/A;  Lumbar three-four  Anterolateral decompression/fusion/percutaneous pedicle screws   SPINE SURGERY  2008   lumbar fusion   TOTAL KNEE ARTHROPLASTY Right 02/03/2017   Procedure: RIGHT TOTAL KNEE ARTHROPLASTY;  Surgeon: Eugenia Mcalpine, MD;  Location: [facility];  Service: Orthopedics;  Laterality: Right;     A IV Location/Drains/Wounds Patient Lines/Drains/Airways Status     Active Line/Drains/Airways     Name Placement date Placement time Site Days   Peripheral IV 08/26/23 18 G  Anterior;Distal;Left;Upper Arm 08/26/23  1758  Arm  less than 1   Peripheral IV 08/26/23 18 G Right Antecubital 08/26/23  1758  Antecubital  less than 1   Closed System Drain 1 Right Knee Accordion (Hemovac) 02/03/17  0000  Knee  2395   Incision 04/30/13 Chest 04/30/13  0847  -- 3770   Incision 04/30/13 Back 04/30/13  0847  -- 3770   Incision (Closed) 02/03/17 Knee Right 02/03/17  0916  -- 2395            Intake/Output Last 24 hours No intake or output data in the 24 hours ending 08/26/23 2135  Labs/Imaging Results for orders placed or performed during the hospital encounter of 08/26/23 (from the past 48 hour(s))  APTT     Status: None   Collection Time: 08/26/23  5:40 PM  Result Value Ref Range   aPTT 27 24 - 36 seconds    Comment: Performed at [facility], [address], [place], [place] [address]  Comprehensive metabolic panel     Status: Abnormal   Collection Time: 08/26/23  5:40 PM  Result Value Ref Range   Sodium 132 (L) 135 - 145 mmol/L   Potassium 3.7 3.5 - 5.1 mmol/L   Chloride 97 (L) 98 - 111 mmol/L   CO2 25 22 - 32 mmol/L   Glucose, Bld 426 (H) 70 - 99 mg/dL    Comment: Glucose reference range applies only to samples taken after fasting for at least 8 hours.   BUN 15 8 - 23 mg/dL   Creatinine, Ser 0.10 (H) 0.61 - 1.24 mg/dL   Calcium 9.2 8.9 - 27.2 mg/dL   Total Protein 7.3 6.5 - 8.1 g/dL   Albumin 4.5 3.5 - 5.0 g/dL   AST 42 (H) 15 - 41 U/L   ALT 30 0 - 44 U/L   Alkaline Phosphatase 42 38 - 126 U/L   Total Bilirubin 0.6 0.3 - 1.2 mg/dL   GFR, Estimated >53 >66 mL/min    Comment: (NOTE) Calculated using the CKD-EPI Creatinine Equation (2021)    Anion gap 10 5 - 15    Comment: Performed at [facility], [address] [place], [place] [address]  CBC     Status: Abnormal   Collection Time: 08/26/23  5:40 PM  Result Value Ref Range   WBC 21.5 (H) 4.0 - 10.5 K/uL   RBC 5.43 4.22 - 5.81 MIL/uL   Hemoglobin 16.9 13.0 - 17.0 g/dL   HCT 74.2 59.5  - 63.8 %   MCV 92.4 80.0 - 100.0 fL   MCH 31.1 26.0 - 34.0 pg   MCHC 33.7 30.0 - 36.0 g/dL   RDW 75.6 43.3 - 29.5 %   Platelets 350 150 - 400 K/uL   nRBC 0.0 0.0 - 0.2 %    Comment: Performed at [facility], [address] [place], [place] [address]  Ethanol  Status: None   Collection Time: 08/26/23  5:40 PM  Result Value Ref Range   Alcohol, Ethyl (B) <10 <10 mg/dL    Comment: (NOTE) Lowest detectable limit for serum alcohol is 10 mg/dL.  For medical purposes only. Performed at [facility], [address] [place], [place] [address]   Protime-INR     Status: None   Collection Time: 08/26/23  5:40 PM  Result Value Ref Range   Prothrombin Time 13.4 11.4 - 15.2 seconds   INR 1.0 0.8 - 1.2    Comment: (NOTE) INR goal varies based on device and disease states. Performed at [facility], [address] [place], [place] [address]   Type and screen [facility]     Status: None   Collection Time: 08/26/23  5:50 PM  Result Value Ref Range   ABO/RH(D) A POS    Antibody Screen NEG    Sample Expiration      08/29/2023,2359 Performed at [facility], [address] [place], [place] [address]   I-Stat CG4 Lactic Acid, ED     Status: Abnormal   Collection Time: 08/26/23  5:56 PM  Result Value Ref Range   Lactic Acid, Venous 2.1 (HH) 0.5 - 1.9 mmol/L   Comment NOTIFIED PHYSICIAN   I-stat chem 8, ed     Status: Abnormal   Collection Time: 08/26/23  5:56 PM  Result Value Ref Range   Sodium 134 (L) 135 - 145 mmol/L   Potassium 3.9 3.5 - 5.1 mmol/L   Chloride 101 98 - 111 mmol/L   BUN 19 8 - 23 mg/dL   Creatinine, Ser 3.66 0.61 - 1.24 mg/dL   Glucose, Bld 440 (H) 70 - 99 mg/dL    Comment: Glucose reference range applies only to samples taken after fasting for at least 8 hours.   Calcium, Ion 1.10 (L) 1.15 - 1.40 mmol/L   TCO2 21 (L) 22 - 32 mmol/L   Hemoglobin 17.7 (H) 13.0 - 17.0 g/dL   HCT 34.7 42.5 - 95.6 %  Urinalysis, Routine  w reflex microscopic -Urine, Clean Catch     Status: Abnormal   Collection Time: 08/26/23  6:01 PM  Result Value Ref Range   Color, Urine YELLOW YELLOW   APPearance CLEAR CLEAR   Specific Gravity, Urine 1.032 (H) 1.005 - 1.030   pH 6.0 5.0 - 8.0   Glucose, UA >=500 (A) NEGATIVE mg/dL   Hgb urine dipstick MODERATE (A) NEGATIVE   Bilirubin Urine NEGATIVE NEGATIVE   Ketones, ur NEGATIVE NEGATIVE mg/dL   Protein, ur NEGATIVE NEGATIVE mg/dL   Nitrite NEGATIVE NEGATIVE   Leukocytes,Ua NEGATIVE NEGATIVE   RBC / HPF 6-10 0 - 5 RBC/hpf   WBC, UA 0-5 0 - 5 WBC/hpf   Bacteria, UA NONE SEEN NONE SEEN   Squamous Epithelial / HPF 0-5 0 - 5 /HPF    Comment: Performed at [facility], [address], [place], [place] [address]   DG Clavicle Left  Result Date: 08/26/2023 CLINICAL DATA:  Fall, left clavicle pain EXAM: LEFT CLAVICLE - 2+ VIEWS COMPARISON:  Same day CT chest abdomen pelvis FINDINGS: Comminuted, mildly displaced fractures of the distal third of the left clavicle, as previously reported by CT. The acromioclavicular and coracoclavicular intervals appear preserved. No fracture or dislocation of the scapula or proximal left humerus. Subcutaneous emphysema about the left chest; previously reported left rib fractures better appreciated by CT. IMPRESSION: 1. Comminuted, mildly  displaced fractures of the distal third of the left clavicle, as previously reported by CT. The acromioclavicular and coracoclavicular intervals appear preserved. 2. No fracture or dislocation of the scapula or proximal left humerus. 3. Subcutaneous emphysema about the left chest; previously reported left rib fractures better appreciated by CT. Electronically Signed   By: Jearld Lesch M.D.   On: 08/26/2023 21:03   CT CHEST ABDOMEN PELVIS W CONTRAST  Result Date: 08/26/2023 CLINICAL DATA:  Trauma, fall from deck 20 feet fall, abrasions to left flank EXAM: CT CHEST, ABDOMEN, AND PELVIS WITH CONTRAST CT THORACIC AND LUMBAR  SPINE WITH CONTRAST TECHNIQUE: Multidetector CT imaging of the chest, abdomen and pelvis was performed following the standard protocol during bolus administration of intravenous contrast. Multidetector CT imaging of the thoracic and lumbar spine was performed following the standard protocol during bolus administration of intravenous contrast. RADIATION DOSE REDUCTION: This exam was performed according to the departmental dose-optimization program which includes automated exposure control, adjustment of the mA and/or kV according to patient size and/or use of iterative reconstruction technique. CONTRAST:  75mL OMNIPAQUE IOHEXOL 350 MG/ML SOLN COMPARISON:  None Available. FINDINGS: CT CHEST FINDINGS Cardiovascular: Aortic atherosclerosis. Heterogeneous, intermediate attenuation lesion in the anterior mediastinum overlying the ascending aorta and pulmonary outflow tract, measuring 3.5 x 2.7 cm with adjacent fat stranding (series 3, image 33). Normal heart size. No pericardial effusion. Mediastinum/Nodes: Pneumomediastinum in the anterior and superior mediastinum (series 4, image 62). No enlarged mediastinal, hilar, or axillary lymph nodes. Thyroid gland, trachea, and esophagus demonstrate no significant findings. Lungs/Pleura: Small left pneumothorax, no greater than 10% volume. Small left pleural effusion. Musculoskeletal: No chest wall mass or suspicious osseous lesions identified. Mildly displaced, comminuted fractures of the distal third of the left clavicle (series 3, image 5). Multiple minimally displaced fractures of the left first through fifth ribs. Subcutaneous emphysema about the left chest wall. CT ABDOMEN PELVIS FINDINGS Hepatobiliary: No solid liver abnormality is seen. No gallstones, gallbladder wall thickening, or biliary dilatation. Pancreas: Unremarkable. No pancreatic ductal dilatation or surrounding inflammatory changes. Spleen: Normal in size without significant abnormality. Adrenals/Urinary Tract:  Adrenal glands are unremarkable. Simple, benign right parapelvic renal cysts, for which no further follow-up or characterization is required. Kidneys are otherwise normal, without renal calculi, solid lesion, or hydronephrosis. Bladder is unremarkable. Stomach/Bowel: Stomach is within normal limits. Appendix appears diminutive although otherwise normal. No evidence of bowel wall thickening, distention, or inflammatory changes. Moderate burden of stool and stool balls throughout the colon and rectum. Vascular/Lymphatic: Aortic atherosclerosis. No enlarged abdominal or pelvic lymph nodes. Reproductive: No mass or other abnormality. Other: Small, fat containing bilateral inguinal hernias. No ascites. Musculoskeletal: No acute osseous findings. CT THORACIC AND LUMBAR SPINE FINDINGS Alignment: Normal thoracic kyphosis. Normal lumbar lordosis. Vertebral bodies: Multiple nondisplaced and mildly displaced fractures of the left thoracic transverse processes, involving at least T1 through T9. No fractures of the vertebral bodies proper or other posterior elements. Disc spaces: Status post lumbar discectomy and fusion of L3-L5. Mild disc space height loss and osteophytosis throughout the thoracic spine. Remaining anatomic lumbar disc spaces are generally preserved. Paraspinous soft tissues: Unremarkable. IMPRESSION: 1. Multiple minimally displaced fractures of the left first through fifth ribs as well as multiple nondisplaced and mildly displaced fractures of the left thoracic transverse processes, involving at least T1 through T9. 2. Mildly displaced, comminuted fractures of the distal third of the left clavicle. 3. Small left pneumothorax, no greater than 10% volume. 4. Small simple fluid attenuation of the left  pleural effusion. 5. Pneumomediastinum and subcutaneous emphysema about the left chest. 6. Heterogeneous, intermediate attenuation lesion in the anterior mediastinum overlying the ascending aorta and pulmonary  outflow tract, measuring 3.5 x 2.7 cm with adjacent fat stranding. This is most consistent with a hematoma, although of uncertain origin, and appears to be separated from the aortic arch by a fat plane. Recommend vascular consultation and short interval follow-up to assess for stability. 7. No CT evidence of acute traumatic injury to the abdomen or pelvis. These results were called by telephone at the time of interpretation on 08/26/2023 at 7:15 pm to Dr. Pricilla Loveless , who verbally acknowledged these results. Aortic Atherosclerosis (ICD10-I70.0). Electronically Signed   By: Jearld Lesch M.D.   On: 08/26/2023 19:22   CT T-SPINE NO CHARGE  Result Date: 08/26/2023 CLINICAL DATA:  Trauma, fall from deck 20 feet fall, abrasions to left flank EXAM: CT CHEST, ABDOMEN, AND PELVIS WITH CONTRAST CT THORACIC AND LUMBAR SPINE WITH CONTRAST TECHNIQUE: Multidetector CT imaging of the chest, abdomen and pelvis was performed following the standard protocol during bolus administration of intravenous contrast. Multidetector CT imaging of the thoracic and lumbar spine was performed following the standard protocol during bolus administration of intravenous contrast. RADIATION DOSE REDUCTION: This exam was performed according to the departmental dose-optimization program which includes automated exposure control, adjustment of the mA and/or kV according to patient size and/or use of iterative reconstruction technique. CONTRAST:  75mL OMNIPAQUE IOHEXOL 350 MG/ML SOLN COMPARISON:  None Available. FINDINGS: CT CHEST FINDINGS Cardiovascular: Aortic atherosclerosis. Heterogeneous, intermediate attenuation lesion in the anterior mediastinum overlying the ascending aorta and pulmonary outflow tract, measuring 3.5 x 2.7 cm with adjacent fat stranding (series 3, image 33). Normal heart size. No pericardial effusion. Mediastinum/Nodes: Pneumomediastinum in the anterior and superior mediastinum (series 4, image 62). No enlarged mediastinal,  hilar, or axillary lymph nodes. Thyroid gland, trachea, and esophagus demonstrate no significant findings. Lungs/Pleura: Small left pneumothorax, no greater than 10% volume. Small left pleural effusion. Musculoskeletal: No chest wall mass or suspicious osseous lesions identified. Mildly displaced, comminuted fractures of the distal third of the left clavicle (series 3, image 5). Multiple minimally displaced fractures of the left first through fifth ribs. Subcutaneous emphysema about the left chest wall. CT ABDOMEN PELVIS FINDINGS Hepatobiliary: No solid liver abnormality is seen. No gallstones, gallbladder wall thickening, or biliary dilatation. Pancreas: Unremarkable. No pancreatic ductal dilatation or surrounding inflammatory changes. Spleen: Normal in size without significant abnormality. Adrenals/Urinary Tract: Adrenal glands are unremarkable. Simple, benign right parapelvic renal cysts, for which no further follow-up or characterization is required. Kidneys are otherwise normal, without renal calculi, solid lesion, or hydronephrosis. Bladder is unremarkable. Stomach/Bowel: Stomach is within normal limits. Appendix appears diminutive although otherwise normal. No evidence of bowel wall thickening, distention, or inflammatory changes. Moderate burden of stool and stool balls throughout the colon and rectum. Vascular/Lymphatic: Aortic atherosclerosis. No enlarged abdominal or pelvic lymph nodes. Reproductive: No mass or other abnormality. Other: Small, fat containing bilateral inguinal hernias. No ascites. Musculoskeletal: No acute osseous findings. CT THORACIC AND LUMBAR SPINE FINDINGS Alignment: Normal thoracic kyphosis. Normal lumbar lordosis. Vertebral bodies: Multiple nondisplaced and mildly displaced fractures of the left thoracic transverse processes, involving at least T1 through T9. No fractures of the vertebral bodies proper or other posterior elements. Disc spaces: Status post lumbar discectomy and  fusion of L3-L5. Mild disc space height loss and osteophytosis throughout the thoracic spine. Remaining anatomic lumbar disc spaces are generally preserved. Paraspinous soft tissues: Unremarkable. IMPRESSION:  1. Multiple minimally displaced fractures of the left first through fifth ribs as well as multiple nondisplaced and mildly displaced fractures of the left thoracic transverse processes, involving at least T1 through T9. 2. Mildly displaced, comminuted fractures of the distal third of the left clavicle. 3. Small left pneumothorax, no greater than 10% volume. 4. Small simple fluid attenuation of the left pleural effusion. 5. Pneumomediastinum and subcutaneous emphysema about the left chest. 6. Heterogeneous, intermediate attenuation lesion in the anterior mediastinum overlying the ascending aorta and pulmonary outflow tract, measuring 3.5 x 2.7 cm with adjacent fat stranding. This is most consistent with a hematoma, although of uncertain origin, and appears to be separated from the aortic arch by a fat plane. Recommend vascular consultation and short interval follow-up to assess for stability. 7. No CT evidence of acute traumatic injury to the abdomen or pelvis. These results were called by telephone at the time of interpretation on 08/26/2023 at 7:15 pm to Dr. Pricilla Loveless , who verbally acknowledged these results. Aortic Atherosclerosis (ICD10-I70.0). Electronically Signed   By: Jearld Lesch M.D.   On: 08/26/2023 19:22   CT L-SPINE NO CHARGE  Result Date: 08/26/2023 CLINICAL DATA:  Trauma, fall from deck 20 feet fall, abrasions to left flank EXAM: CT CHEST, ABDOMEN, AND PELVIS WITH CONTRAST CT THORACIC AND LUMBAR SPINE WITH CONTRAST TECHNIQUE: Multidetector CT imaging of the chest, abdomen and pelvis was performed following the standard protocol during bolus administration of intravenous contrast. Multidetector CT imaging of the thoracic and lumbar spine was performed following the standard protocol  during bolus administration of intravenous contrast. RADIATION DOSE REDUCTION: This exam was performed according to the departmental dose-optimization program which includes automated exposure control, adjustment of the mA and/or kV according to patient size and/or use of iterative reconstruction technique. CONTRAST:  75mL OMNIPAQUE IOHEXOL 350 MG/ML SOLN COMPARISON:  None Available. FINDINGS: CT CHEST FINDINGS Cardiovascular: Aortic atherosclerosis. Heterogeneous, intermediate attenuation lesion in the anterior mediastinum overlying the ascending aorta and pulmonary outflow tract, measuring 3.5 x 2.7 cm with adjacent fat stranding (series 3, image 33). Normal heart size. No pericardial effusion. Mediastinum/Nodes: Pneumomediastinum in the anterior and superior mediastinum (series 4, image 62). No enlarged mediastinal, hilar, or axillary lymph nodes. Thyroid gland, trachea, and esophagus demonstrate no significant findings. Lungs/Pleura: Small left pneumothorax, no greater than 10% volume. Small left pleural effusion. Musculoskeletal: No chest wall mass or suspicious osseous lesions identified. Mildly displaced, comminuted fractures of the distal third of the left clavicle (series 3, image 5). Multiple minimally displaced fractures of the left first through fifth ribs. Subcutaneous emphysema about the left chest wall. CT ABDOMEN PELVIS FINDINGS Hepatobiliary: No solid liver abnormality is seen. No gallstones, gallbladder wall thickening, or biliary dilatation. Pancreas: Unremarkable. No pancreatic ductal dilatation or surrounding inflammatory changes. Spleen: Normal in size without significant abnormality. Adrenals/Urinary Tract: Adrenal glands are unremarkable. Simple, benign right parapelvic renal cysts, for which no further follow-up or characterization is required. Kidneys are otherwise normal, without renal calculi, solid lesion, or hydronephrosis. Bladder is unremarkable. Stomach/Bowel: Stomach is within  normal limits. Appendix appears diminutive although otherwise normal. No evidence of bowel wall thickening, distention, or inflammatory changes. Moderate burden of stool and stool balls throughout the colon and rectum. Vascular/Lymphatic: Aortic atherosclerosis. No enlarged abdominal or pelvic lymph nodes. Reproductive: No mass or other abnormality. Other: Small, fat containing bilateral inguinal hernias. No ascites. Musculoskeletal: No acute osseous findings. CT THORACIC AND LUMBAR SPINE FINDINGS Alignment: Normal thoracic kyphosis. Normal lumbar lordosis. Vertebral bodies: Multiple  nondisplaced and mildly displaced fractures of the left thoracic transverse processes, involving at least T1 through T9. No fractures of the vertebral bodies proper or other posterior elements. Disc spaces: Status post lumbar discectomy and fusion of L3-L5. Mild disc space height loss and osteophytosis throughout the thoracic spine. Remaining anatomic lumbar disc spaces are generally preserved. Paraspinous soft tissues: Unremarkable. IMPRESSION: 1. Multiple minimally displaced fractures of the left first through fifth ribs as well as multiple nondisplaced and mildly displaced fractures of the left thoracic transverse processes, involving at least T1 through T9. 2. Mildly displaced, comminuted fractures of the distal third of the left clavicle. 3. Small left pneumothorax, no greater than 10% volume. 4. Small simple fluid attenuation of the left pleural effusion. 5. Pneumomediastinum and subcutaneous emphysema about the left chest. 6. Heterogeneous, intermediate attenuation lesion in the anterior mediastinum overlying the ascending aorta and pulmonary outflow tract, measuring 3.5 x 2.7 cm with adjacent fat stranding. This is most consistent with a hematoma, although of uncertain origin, and appears to be separated from the aortic arch by a fat plane. Recommend vascular consultation and short interval follow-up to assess for stability. 7.  No CT evidence of acute traumatic injury to the abdomen or pelvis. These results were called by telephone at the time of interpretation on 08/26/2023 at 7:15 pm to Dr. Pricilla Loveless , who verbally acknowledged these results. Aortic Atherosclerosis (ICD10-I70.0). Electronically Signed   By: Jearld Lesch M.D.   On: 08/26/2023 19:22   DG Pelvis Portable  Result Date: 08/26/2023 CLINICAL DATA:  trauma EXAM: PORTABLE PELVIS 1-2 VIEWS COMPARISON:  CT abdomen pelvis 08/26/2023 FINDINGS: There is no evidence of pelvic fracture or diastasis. No acute displaced fracture or dislocation of either hips on frontal view. Visualized lower lumbar spine surgical hardware. No pelvic bone lesions are seen. IMPRESSION: Negative for acute traumatic injury. Electronically Signed   By: Tish Frederickson M.D.   On: 08/26/2023 19:18   DG Chest Portable 1 View  Result Date: 08/26/2023 CLINICAL DATA:  trauma.  Fall off deck about 20 feet. EXAM: PORTABLE CHEST 1 VIEW COMPARISON:  CT chest 08/26/2023 FINDINGS: The heart and mediastinal contours are within normal limits. Atherosclerotic plaque. No focal consolidation. No pulmonary edema. No pleural effusion. Known left pneumothorax better evaluated on CT chest 08/26/2023. No right pneumothorax. Multiple acute left rib fractures better evaluated on CT chest 08/26/2023. Acute minimally displaced left T1 transverse process fracture. Acute comminuted and displaced distal left clavicular fracture. IMPRESSION: 1. Known trace to small volume left pneumothorax better evaluated on CT chest 08/26/2023. 2. Multiple acute left rib fractures better evaluated on CT chest 08/26/2023. 3. Acute minimally displaced left T1 transverse process fracture. 4. Acute comminuted and displaced distal left clavicular fracture. 5.  Aortic Atherosclerosis (ICD10-I70.0). These results were called by telephone at the time of interpretation on 08/26/2023 at 7:08 pm to provider Pricilla Loveless , who verbally acknowledged  these results. Electronically Signed   By: Tish Frederickson M.D.   On: 08/26/2023 19:16   CT Head Wo Contrast  Result Date: 08/26/2023 CLINICAL DATA:  Head trauma, GCS<=14 (Ped 0-17y); Polytrauma, blunt EXAM: CT HEAD WITHOUT CONTRAST CT CERVICAL SPINE WITHOUT CONTRAST TECHNIQUE: Multidetector CT imaging of the head and cervical spine was performed following the standard protocol without intravenous contrast. Multiplanar CT image reconstructions of the cervical spine were also generated. RADIATION DOSE REDUCTION: This exam was performed according to the departmental dose-optimization program which includes automated exposure control, adjustment of the mA and/or kV according  to patient size and/or use of iterative reconstruction technique. COMPARISON:  None Available. FINDINGS: CT HEAD FINDINGS Brain: No evidence of large-territorial acute infarction. No parenchymal hemorrhage. No mass lesion. No extra-axial collection. No mass effect or midline shift. No hydrocephalus. Basilar cisterns are patent. Vascular: No hyperdense vessel. Skull: No acute fracture or focal lesion. Sinuses/Orbits: Paranasal sinuses and mastoid air cells are clear. Bilateral lens replacement. Otherwise the orbits are unremarkable. Other: None. CT CERVICAL SPINE FINDINGS Alignment: Normal. Skull base and vertebrae: Acute displaced left C7 transverse process fracture. No aggressive appearing focal osseous lesion or focal pathologic process. Soft tissues and spinal canal: No prevertebral fluid or swelling. No visible canal hematoma. Upper chest: Trace apical left pneumothorax. Other: Acute displaced left T1 transverse process fracture. Acute displaced posterior 1-3 left rib fracture. Subcutaneus soft tissue emphysema of the left neck and visualized shoulder/back. Acute displaced and comminuted fractured left distal clavicle. IMPRESSION: 1. No acute intracranial abnormality. 2. Acute displaced left C7 transverse process fracture. 3. Acute  displaced left T1 transverse process fracture. 4. Acute displaced posterior 1-3 left rib fracture. 5. Trace apical left pneumothorax. 6. Acute displaced and comminuted fractured left distal clavicle. 7. Please see separately dictated CT chest and CT thoracic spine 08/26/2023. These results were called by telephone at the time of interpretation on 08/26/2023 at 7:08 pm to provider Pricilla Loveless , who verbally acknowledged these results. Electronically Signed   By: Tish Frederickson M.D.   On: 08/26/2023 19:10   CT CERVICAL SPINE WO CONTRAST  Result Date: 08/26/2023 CLINICAL DATA:  Head trauma, GCS<=14 (Ped 0-17y); Polytrauma, blunt EXAM: CT HEAD WITHOUT CONTRAST CT CERVICAL SPINE WITHOUT CONTRAST TECHNIQUE: Multidetector CT imaging of the head and cervical spine was performed following the standard protocol without intravenous contrast. Multiplanar CT image reconstructions of the cervical spine were also generated. RADIATION DOSE REDUCTION: This exam was performed according to the departmental dose-optimization program which includes automated exposure control, adjustment of the mA and/or kV according to patient size and/or use of iterative reconstruction technique. COMPARISON:  None Available. FINDINGS: CT HEAD FINDINGS Brain: No evidence of large-territorial acute infarction. No parenchymal hemorrhage. No mass lesion. No extra-axial collection. No mass effect or midline shift. No hydrocephalus. Basilar cisterns are patent. Vascular: No hyperdense vessel. Skull: No acute fracture or focal lesion. Sinuses/Orbits: Paranasal sinuses and mastoid air cells are clear. Bilateral lens replacement. Otherwise the orbits are unremarkable. Other: None. CT CERVICAL SPINE FINDINGS Alignment: Normal. Skull base and vertebrae: Acute displaced left C7 transverse process fracture. No aggressive appearing focal osseous lesion or focal pathologic process. Soft tissues and spinal canal: No prevertebral fluid or swelling. No visible  canal hematoma. Upper chest: Trace apical left pneumothorax. Other: Acute displaced left T1 transverse process fracture. Acute displaced posterior 1-3 left rib fracture. Subcutaneus soft tissue emphysema of the left neck and visualized shoulder/back. Acute displaced and comminuted fractured left distal clavicle. IMPRESSION: 1. No acute intracranial abnormality. 2. Acute displaced left C7 transverse process fracture. 3. Acute displaced left T1 transverse process fracture. 4. Acute displaced posterior 1-3 left rib fracture. 5. Trace apical left pneumothorax. 6. Acute displaced and comminuted fractured left distal clavicle. 7. Please see separately dictated CT chest and CT thoracic spine 08/26/2023. These results were called by telephone at the time of interpretation on 08/26/2023 at 7:08 pm to provider Pricilla Loveless , who verbally acknowledged these results. Electronically Signed   By: Tish Frederickson M.D.   On: 08/26/2023 19:10    Pending Labs [employer] (From  admission, onward)     Start     Ordered   08/27/23 0500  CBC  Tomorrow morning,   R        08/26/23 1941   08/27/23 0500  Basic metabolic panel  Tomorrow morning,   R        08/26/23 1941   08/26/23 1940  HIV Antibody (routine testing w rflx)  (HIV Antibody (Routine testing w reflex) panel)  Once,   R        08/26/23 1941            Vitals/Pain Today's Vitals   08/26/23 1722 08/26/23 1800 08/26/23 1803 08/26/23 1815  BP: (!) 137/101 (!) 162/106  (!) 167/93  Pulse: 97 98  98  Resp: (!) 21 (!) 22  (!) 23  Temp: 97.6 F (36.4 C)     TempSrc: Oral     SpO2: 95% 96%  92%  Weight:   210 lb (95.3 kg)   Height:   5\' 10"  (1.778 m)     Isolation Precautions No active isolations  Medications Medications  acetaminophen (TYLENOL) tablet 1,000 mg (1,000 mg Oral Given 08/26/23 2056)  oxyCODONE (Oxy IR/ROXICODONE) immediate release tablet 5-10 mg (has no administration in time range)  morphine (PF) 4 MG/ML injection 4 mg (has no  administration in time range)  methocarbamol (ROBAXIN) tablet 500 mg ( Oral See Alternative 08/26/23 2130)    Or  methocarbamol (ROBAXIN) 500 mg in dextrose 5 % 50 mL IVPB (0 mg Intravenous Stopped 08/26/23 2130)  docusate sodium (COLACE) capsule 100 mg (has no administration in time range)  polyethylene glycol (MIRALAX / GLYCOLAX) packet 17 g (has no administration in time range)  ondansetron (ZOFRAN-ODT) disintegrating tablet 4 mg (has no administration in time range)    Or  ondansetron (ZOFRAN) injection 4 mg (has no administration in time range)  metoprolol tartrate (LOPRESSOR) injection 5 mg (has no administration in time range)  hydrALAZINE (APRESOLINE) injection 10 mg (has no administration in time range)  enoxaparin (LOVENOX) injection 30 mg (has no administration in time range)  lidocaine (LIDODERM) 5 % 2 patch (has no administration in time range)  ketorolac (TORADOL) 15 MG/ML injection 30 mg (30 mg Intravenous Given 08/26/23 2129)  iohexol (OMNIPAQUE) 350 MG/ML injection 75 mL (75 mLs Intravenous Contrast Given 08/26/23 1839)    Mobility walks     Focused Assessments Cardiac Assessment Handoff:  Cardiac Rhythm: Normal sinus rhythm No results found for: "CKTOTAL", "CKMB", "CKMBINDEX", "TROPONINI" No results found for: "DDIMER" Does the Patient currently have chest pain? Yes   , Neuro Assessment Handoff:  Swallow screen pass? Yes  Cardiac Rhythm: Normal sinus rhythm       Neuro Assessment: Exceptions to [facility] Neuro Checks:      Has TPA been given?  If patient is a Neuro Trauma and patient is going to OR before floor call report to 4N Charge nurse: [phone] or [phone]  , Renal Assessment Handoff:  Hemodialysis Schedule:  Last Hemodialysis date and time:   Restricted appendage:   , Pulmonary Assessment Handoff:  Lung sounds:   O2 Device: Room Air      R Recommendations: See Admitting Provider Note  Report given to:   Additional Notes:  intermittent slurred speech and confusion. Fell approx. 20 ft from deck to hard ground. GCS 14. Vitals stable.

## 2023-08-26 NOTE — ED Notes (Signed)
Trauma Response Nurse Documentation   Erik Holmes. is a 62 y.o. male arriving to [facility] ED via EMS  On No antithrombotic. Trauma was activated as a Level 2 by ED Charge RN based on the following trauma criteria Falls > 20 ft. with adults, >10 ft. with children (<15).  Patient cleared for CT by Dr. Criss Alvine. Pt transported to CT with trauma response nurse present to monitor. RN remained with the patient throughout their absence from the department for clinical observation.   GCS 13.  History   Past Medical History:  Diagnosis Date   Allergy    Anxiety    Cataract    Degenerative disc disease, lumbar    Depression    Diabetes mellitus without complication (HCC)    Diabetes mellitus, type II (HCC)    ED (erectile dysfunction)    GERD (gastroesophageal reflux disease)    Gout    Hypertension    Internal hemorrhoids with prolapse, bleeding 04/24/2014   RLS (restless legs syndrome)    Seizures (HCC)    last seizure around 2016 per patient.   Shortness of breath    Sleep apnea    cpap   Tubular adenoma of colon    multiple, TV adenomas also     Past Surgical History:  Procedure Laterality Date   ANTERIOR LAT LUMBAR FUSION Left 04/30/2013   Procedure: ANTERIOR LATERAL LUMBAR FUSION 1 LEVEL;  Surgeon: Maeola Harman, MD;  Location: [facility];  Service: Neurosurgery;  Laterality: Left;  Lumbar three-four  Anterolateral decompression/fusion/percutaneous pedicle screws    BACK SURGERY     CARDIAC CATHETERIZATION  1998   90's  neg   cataract surgery Bilateral    COLONOSCOPY  multiple   Dr. Karma Lew   EYE SURGERY     cataracts bilateral with lens placement   hemorrhoid banding  2015   LUMBAR PERCUTANEOUS PEDICLE SCREW 1 LEVEL N/A 04/30/2013   Procedure: LUMBAR PERCUTANEOUS PEDICLE SCREW 1 LEVEL;  Surgeon: Maeola Harman, MD;  Location: [facility];  Service: Neurosurgery;  Laterality: N/A;  Lumbar three-four  Anterolateral decompression/fusion/percutaneous pedicle  screws   SPINE SURGERY  2008   lumbar fusion   TOTAL KNEE ARTHROPLASTY Right 02/03/2017   Procedure: RIGHT TOTAL KNEE ARTHROPLASTY;  Surgeon: Eugenia Mcalpine, MD;  Location: [facility];  Service: Orthopedics;  Laterality: Right;     Initial Focused Assessment (If applicable, or please see trauma documentation): - Airway intact, patent - Breathing, labored and diminished slightly on left. - Circulation: multiple abrasions to bilateral arms, bleeding controlled - C-collar in place - Pt confused and lethargic   CT's Completed:   CT Head, CT C-Spine, CT Chest w/ contrast, and CT abdomen/pelvis w/ contrast   Interventions:  - 18G PIV to L AC - 18G PIV to R FA - Trauma labs - CXR - Pelvic XR - CT pan scan  Plan for disposition:  Other Awaiting scan results  Consults completed:  trauma surgery @ 1827.  Event Summary: Pt was on his deck and sustained an approx 51ft fall head first.  Pt's wife saw a part of the event.  Pt had +LOC and is amnestic to the event. Repetitive questioning.   Bedside handoff with ED RN Erik Holmes.    [name]  Trauma Response RN  Please call TRN at [phone] for further assistance.

## 2023-08-26 NOTE — Progress Notes (Addendum)
Providing Compassionate, Quality Care - Together   BP (!) 167/93   Pulse 98   Temp 97.6 F (36.4 C) (Oral)   Resp (!) 23   Ht 5\' 10"  (1.778 m)   Wt 95.3 kg   SpO2 92%   BMI 30.13 kg/m    Erik Holmes is a 62 year old male with a remote history of a lumbar fusion at L4-5 and an ALIF by Dr.Stern that was performed in 2014. He suffered a fall from a three story deck this evening. He has multiple injuries including transverse process fractures of the thoracic spine and a C7 transverse process fracture. The patient can wear a hard cervical collar for this injury. He is fine to remove the collar for dressing and showering.  The thoracic transverse process fractures are stable. I doubt the patient would tolerate a TLSO brace with his multiple rib fractures and it's only purpose would be for the patient's comfort. May mobilize as tolerated from a Neurosurgical perspective.   CT CHEST ABDOMEN PELVIS W CONTRAST  Result Date: 08/26/2023 CLINICAL DATA:  Trauma, fall from deck 20 feet fall, abrasions to left flank EXAM: CT CHEST, ABDOMEN, AND PELVIS WITH CONTRAST CT THORACIC AND LUMBAR SPINE WITH CONTRAST TECHNIQUE: Multidetector CT imaging of the chest, abdomen and pelvis was performed following the standard protocol during bolus administration of intravenous contrast. Multidetector CT imaging of the thoracic and lumbar spine was performed following the standard protocol during bolus administration of intravenous contrast. RADIATION DOSE REDUCTION: This exam was performed according to the departmental dose-optimization program which includes automated exposure control, adjustment of the mA and/or kV according to patient size and/or use of iterative reconstruction technique. CONTRAST:  75mL OMNIPAQUE IOHEXOL 350 MG/ML SOLN COMPARISON:  None Available. FINDINGS: CT CHEST FINDINGS Cardiovascular: Aortic atherosclerosis. Heterogeneous, intermediate attenuation lesion in the anterior mediastinum overlying  the ascending aorta and pulmonary outflow tract, measuring 3.5 x 2.7 cm with adjacent fat stranding (series 3, image 33). Normal heart size. No pericardial effusion. Mediastinum/Nodes: Pneumomediastinum in the anterior and superior mediastinum (series 4, image 62). No enlarged mediastinal, hilar, or axillary lymph nodes. Thyroid gland, trachea, and esophagus demonstrate no significant findings. Lungs/Pleura: Small left pneumothorax, no greater than 10% volume. Small left pleural effusion. Musculoskeletal: No chest wall mass or suspicious osseous lesions identified. Mildly displaced, comminuted fractures of the distal third of the left clavicle (series 3, image 5). Multiple minimally displaced fractures of the left first through fifth ribs. Subcutaneous emphysema about the left chest wall. CT ABDOMEN PELVIS FINDINGS Hepatobiliary: No solid liver abnormality is seen. No gallstones, gallbladder wall thickening, or biliary dilatation. Pancreas: Unremarkable. No pancreatic ductal dilatation or surrounding inflammatory changes. Spleen: Normal in size without significant abnormality. Adrenals/Urinary Tract: Adrenal glands are unremarkable. Simple, benign right parapelvic renal cysts, for which no further follow-up or characterization is required. Kidneys are otherwise normal, without renal calculi, solid lesion, or hydronephrosis. Bladder is unremarkable. Stomach/Bowel: Stomach is within normal limits. Appendix appears diminutive although otherwise normal. No evidence of bowel wall thickening, distention, or inflammatory changes. Moderate burden of stool and stool balls throughout the colon and rectum. Vascular/Lymphatic: Aortic atherosclerosis. No enlarged abdominal or pelvic lymph nodes. Reproductive: No mass or other abnormality. Other: Small, fat containing bilateral inguinal hernias. No ascites. Musculoskeletal: No acute osseous findings. CT THORACIC AND LUMBAR SPINE FINDINGS Alignment: Normal thoracic kyphosis.  Normal lumbar lordosis. Vertebral bodies: Multiple nondisplaced and mildly displaced fractures of the left thoracic transverse processes, involving at least T1 through T9.  No fractures of the vertebral bodies proper or other posterior elements. Disc spaces: Status post lumbar discectomy and fusion of L3-L5. Mild disc space height loss and osteophytosis throughout the thoracic spine. Remaining anatomic lumbar disc spaces are generally preserved. Paraspinous soft tissues: Unremarkable. IMPRESSION: 1. Multiple minimally displaced fractures of the left first through fifth ribs as well as multiple nondisplaced and mildly displaced fractures of the left thoracic transverse processes, involving at least T1 through T9. 2. Mildly displaced, comminuted fractures of the distal third of the left clavicle. 3. Small left pneumothorax, no greater than 10% volume. 4. Small simple fluid attenuation of the left pleural effusion. 5. Pneumomediastinum and subcutaneous emphysema about the left chest. 6. Heterogeneous, intermediate attenuation lesion in the anterior mediastinum overlying the ascending aorta and pulmonary outflow tract, measuring 3.5 x 2.7 cm with adjacent fat stranding. This is most consistent with a hematoma, although of uncertain origin, and appears to be separated from the aortic arch by a fat plane. Recommend vascular consultation and short interval follow-up to assess for stability. 7. No CT evidence of acute traumatic injury to the abdomen or pelvis. These results were called by telephone at the time of interpretation on 08/26/2023 at 7:15 pm to Dr. Pricilla Loveless , who verbally acknowledged these results. Aortic Atherosclerosis (ICD10-I70.0). Electronically Signed   By: Jearld Lesch M.D.   On: 08/26/2023 19:22   CT T-SPINE NO CHARGE  Result Date: 08/26/2023 CLINICAL DATA:  Trauma, fall from deck 20 feet fall, abrasions to left flank EXAM: CT CHEST, ABDOMEN, AND PELVIS WITH CONTRAST CT THORACIC AND LUMBAR SPINE  WITH CONTRAST TECHNIQUE: Multidetector CT imaging of the chest, abdomen and pelvis was performed following the standard protocol during bolus administration of intravenous contrast. Multidetector CT imaging of the thoracic and lumbar spine was performed following the standard protocol during bolus administration of intravenous contrast. RADIATION DOSE REDUCTION: This exam was performed according to the departmental dose-optimization program which includes automated exposure control, adjustment of the mA and/or kV according to patient size and/or use of iterative reconstruction technique. CONTRAST:  75mL OMNIPAQUE IOHEXOL 350 MG/ML SOLN COMPARISON:  None Available. FINDINGS: CT CHEST FINDINGS Cardiovascular: Aortic atherosclerosis. Heterogeneous, intermediate attenuation lesion in the anterior mediastinum overlying the ascending aorta and pulmonary outflow tract, measuring 3.5 x 2.7 cm with adjacent fat stranding (series 3, image 33). Normal heart size. No pericardial effusion. Mediastinum/Nodes: Pneumomediastinum in the anterior and superior mediastinum (series 4, image 62). No enlarged mediastinal, hilar, or axillary lymph nodes. Thyroid gland, trachea, and esophagus demonstrate no significant findings. Lungs/Pleura: Small left pneumothorax, no greater than 10% volume. Small left pleural effusion. Musculoskeletal: No chest wall mass or suspicious osseous lesions identified. Mildly displaced, comminuted fractures of the distal third of the left clavicle (series 3, image 5). Multiple minimally displaced fractures of the left first through fifth ribs. Subcutaneous emphysema about the left chest wall. CT ABDOMEN PELVIS FINDINGS Hepatobiliary: No solid liver abnormality is seen. No gallstones, gallbladder wall thickening, or biliary dilatation. Pancreas: Unremarkable. No pancreatic ductal dilatation or surrounding inflammatory changes. Spleen: Normal in size without significant abnormality. Adrenals/Urinary Tract:  Adrenal glands are unremarkable. Simple, benign right parapelvic renal cysts, for which no further follow-up or characterization is required. Kidneys are otherwise normal, without renal calculi, solid lesion, or hydronephrosis. Bladder is unremarkable. Stomach/Bowel: Stomach is within normal limits. Appendix appears diminutive although otherwise normal. No evidence of bowel wall thickening, distention, or inflammatory changes. Moderate burden of stool and stool balls throughout the colon and rectum. Vascular/Lymphatic:  Aortic atherosclerosis. No enlarged abdominal or pelvic lymph nodes. Reproductive: No mass or other abnormality. Other: Small, fat containing bilateral inguinal hernias. No ascites. Musculoskeletal: No acute osseous findings. CT THORACIC AND LUMBAR SPINE FINDINGS Alignment: Normal thoracic kyphosis. Normal lumbar lordosis. Vertebral bodies: Multiple nondisplaced and mildly displaced fractures of the left thoracic transverse processes, involving at least T1 through T9. No fractures of the vertebral bodies proper or other posterior elements. Disc spaces: Status post lumbar discectomy and fusion of L3-L5. Mild disc space height loss and osteophytosis throughout the thoracic spine. Remaining anatomic lumbar disc spaces are generally preserved. Paraspinous soft tissues: Unremarkable. IMPRESSION: 1. Multiple minimally displaced fractures of the left first through fifth ribs as well as multiple nondisplaced and mildly displaced fractures of the left thoracic transverse processes, involving at least T1 through T9. 2. Mildly displaced, comminuted fractures of the distal third of the left clavicle. 3. Small left pneumothorax, no greater than 10% volume. 4. Small simple fluid attenuation of the left pleural effusion. 5. Pneumomediastinum and subcutaneous emphysema about the left chest. 6. Heterogeneous, intermediate attenuation lesion in the anterior mediastinum overlying the ascending aorta and pulmonary  outflow tract, measuring 3.5 x 2.7 cm with adjacent fat stranding. This is most consistent with a hematoma, although of uncertain origin, and appears to be separated from the aortic arch by a fat plane. Recommend vascular consultation and short interval follow-up to assess for stability. 7. No CT evidence of acute traumatic injury to the abdomen or pelvis. These results were called by telephone at the time of interpretation on 08/26/2023 at 7:15 pm to Dr. Pricilla Loveless , who verbally acknowledged these results. Aortic Atherosclerosis (ICD10-I70.0). Electronically Signed   By: Jearld Lesch M.D.   On: 08/26/2023 19:22   CT L-SPINE NO CHARGE  Result Date: 08/26/2023 CLINICAL DATA:  Trauma, fall from deck 20 feet fall, abrasions to left flank EXAM: CT CHEST, ABDOMEN, AND PELVIS WITH CONTRAST CT THORACIC AND LUMBAR SPINE WITH CONTRAST TECHNIQUE: Multidetector CT imaging of the chest, abdomen and pelvis was performed following the standard protocol during bolus administration of intravenous contrast. Multidetector CT imaging of the thoracic and lumbar spine was performed following the standard protocol during bolus administration of intravenous contrast. RADIATION DOSE REDUCTION: This exam was performed according to the departmental dose-optimization program which includes automated exposure control, adjustment of the mA and/or kV according to patient size and/or use of iterative reconstruction technique. CONTRAST:  75mL OMNIPAQUE IOHEXOL 350 MG/ML SOLN COMPARISON:  None Available. FINDINGS: CT CHEST FINDINGS Cardiovascular: Aortic atherosclerosis. Heterogeneous, intermediate attenuation lesion in the anterior mediastinum overlying the ascending aorta and pulmonary outflow tract, measuring 3.5 x 2.7 cm with adjacent fat stranding (series 3, image 33). Normal heart size. No pericardial effusion. Mediastinum/Nodes: Pneumomediastinum in the anterior and superior mediastinum (series 4, image 62). No enlarged mediastinal,  hilar, or axillary lymph nodes. Thyroid gland, trachea, and esophagus demonstrate no significant findings. Lungs/Pleura: Small left pneumothorax, no greater than 10% volume. Small left pleural effusion. Musculoskeletal: No chest wall mass or suspicious osseous lesions identified. Mildly displaced, comminuted fractures of the distal third of the left clavicle (series 3, image 5). Multiple minimally displaced fractures of the left first through fifth ribs. Subcutaneous emphysema about the left chest wall. CT ABDOMEN PELVIS FINDINGS Hepatobiliary: No solid liver abnormality is seen. No gallstones, gallbladder wall thickening, or biliary dilatation. Pancreas: Unremarkable. No pancreatic ductal dilatation or surrounding inflammatory changes. Spleen: Normal in size without significant abnormality. Adrenals/Urinary Tract: Adrenal glands are unremarkable. Simple, benign  right parapelvic renal cysts, for which no further follow-up or characterization is required. Kidneys are otherwise normal, without renal calculi, solid lesion, or hydronephrosis. Bladder is unremarkable. Stomach/Bowel: Stomach is within normal limits. Appendix appears diminutive although otherwise normal. No evidence of bowel wall thickening, distention, or inflammatory changes. Moderate burden of stool and stool balls throughout the colon and rectum. Vascular/Lymphatic: Aortic atherosclerosis. No enlarged abdominal or pelvic lymph nodes. Reproductive: No mass or other abnormality. Other: Small, fat containing bilateral inguinal hernias. No ascites. Musculoskeletal: No acute osseous findings. CT THORACIC AND LUMBAR SPINE FINDINGS Alignment: Normal thoracic kyphosis. Normal lumbar lordosis. Vertebral bodies: Multiple nondisplaced and mildly displaced fractures of the left thoracic transverse processes, involving at least T1 through T9. No fractures of the vertebral bodies proper or other posterior elements. Disc spaces: Status post lumbar discectomy and  fusion of L3-L5. Mild disc space height loss and osteophytosis throughout the thoracic spine. Remaining anatomic lumbar disc spaces are generally preserved. Paraspinous soft tissues: Unremarkable. IMPRESSION: 1. Multiple minimally displaced fractures of the left first through fifth ribs as well as multiple nondisplaced and mildly displaced fractures of the left thoracic transverse processes, involving at least T1 through T9. 2. Mildly displaced, comminuted fractures of the distal third of the left clavicle. 3. Small left pneumothorax, no greater than 10% volume. 4. Small simple fluid attenuation of the left pleural effusion. 5. Pneumomediastinum and subcutaneous emphysema about the left chest. 6. Heterogeneous, intermediate attenuation lesion in the anterior mediastinum overlying the ascending aorta and pulmonary outflow tract, measuring 3.5 x 2.7 cm with adjacent fat stranding. This is most consistent with a hematoma, although of uncertain origin, and appears to be separated from the aortic arch by a fat plane. Recommend vascular consultation and short interval follow-up to assess for stability. 7. No CT evidence of acute traumatic injury to the abdomen or pelvis. These results were called by telephone at the time of interpretation on 08/26/2023 at 7:15 pm to Dr. Pricilla Loveless , who verbally acknowledged these results. Aortic Atherosclerosis (ICD10-I70.0). Electronically Signed   By: Jearld Lesch M.D.   On: 08/26/2023 19:22   DG Pelvis Portable  Result Date: 08/26/2023 CLINICAL DATA:  trauma EXAM: PORTABLE PELVIS 1-2 VIEWS COMPARISON:  CT abdomen pelvis 08/26/2023 FINDINGS: There is no evidence of pelvic fracture or diastasis. No acute displaced fracture or dislocation of either hips on frontal view. Visualized lower lumbar spine surgical hardware. No pelvic bone lesions are seen. IMPRESSION: Negative for acute traumatic injury. Electronically Signed   By: Tish Frederickson M.D.   On: 08/26/2023 19:18   DG  Chest Portable 1 View  Result Date: 08/26/2023 CLINICAL DATA:  trauma.  Fall off deck about 20 feet. EXAM: PORTABLE CHEST 1 VIEW COMPARISON:  CT chest 08/26/2023 FINDINGS: The heart and mediastinal contours are within normal limits. Atherosclerotic plaque. No focal consolidation. No pulmonary edema. No pleural effusion. Known left pneumothorax better evaluated on CT chest 08/26/2023. No right pneumothorax. Multiple acute left rib fractures better evaluated on CT chest 08/26/2023. Acute minimally displaced left T1 transverse process fracture. Acute comminuted and displaced distal left clavicular fracture. IMPRESSION: 1. Known trace to small volume left pneumothorax better evaluated on CT chest 08/26/2023. 2. Multiple acute left rib fractures better evaluated on CT chest 08/26/2023. 3. Acute minimally displaced left T1 transverse process fracture. 4. Acute comminuted and displaced distal left clavicular fracture. 5.  Aortic Atherosclerosis (ICD10-I70.0). These results were called by telephone at the time of interpretation on 08/26/2023 at 7:08 pm to provider  Pricilla Loveless , who verbally acknowledged these results. Electronically Signed   By: Tish Frederickson M.D.   On: 08/26/2023 19:16   CT Head Wo Contrast  Result Date: 08/26/2023 CLINICAL DATA:  Head trauma, GCS<=14 (Ped 0-17y); Polytrauma, blunt EXAM: CT HEAD WITHOUT CONTRAST CT CERVICAL SPINE WITHOUT CONTRAST TECHNIQUE: Multidetector CT imaging of the head and cervical spine was performed following the standard protocol without intravenous contrast. Multiplanar CT image reconstructions of the cervical spine were also generated. RADIATION DOSE REDUCTION: This exam was performed according to the departmental dose-optimization program which includes automated exposure control, adjustment of the mA and/or kV according to patient size and/or use of iterative reconstruction technique. COMPARISON:  None Available. FINDINGS: CT HEAD FINDINGS Brain: No evidence of  large-territorial acute infarction. No parenchymal hemorrhage. No mass lesion. No extra-axial collection. No mass effect or midline shift. No hydrocephalus. Basilar cisterns are patent. Vascular: No hyperdense vessel. Skull: No acute fracture or focal lesion. Sinuses/Orbits: Paranasal sinuses and mastoid air cells are clear. Bilateral lens replacement. Otherwise the orbits are unremarkable. Other: None. CT CERVICAL SPINE FINDINGS Alignment: Normal. Skull base and vertebrae: Acute displaced left C7 transverse process fracture. No aggressive appearing focal osseous lesion or focal pathologic process. Soft tissues and spinal canal: No prevertebral fluid or swelling. No visible canal hematoma. Upper chest: Trace apical left pneumothorax. Other: Acute displaced left T1 transverse process fracture. Acute displaced posterior 1-3 left rib fracture. Subcutaneus soft tissue emphysema of the left neck and visualized shoulder/back. Acute displaced and comminuted fractured left distal clavicle. IMPRESSION: 1. No acute intracranial abnormality. 2. Acute displaced left C7 transverse process fracture. 3. Acute displaced left T1 transverse process fracture. 4. Acute displaced posterior 1-3 left rib fracture. 5. Trace apical left pneumothorax. 6. Acute displaced and comminuted fractured left distal clavicle. 7. Please see separately dictated CT chest and CT thoracic spine 08/26/2023. These results were called by telephone at the time of interpretation on 08/26/2023 at 7:08 pm to provider Pricilla Loveless , who verbally acknowledged these results. Electronically Signed   By: Tish Frederickson M.D.   On: 08/26/2023 19:10   CT CERVICAL SPINE WO CONTRAST  Result Date: 08/26/2023 CLINICAL DATA:  Head trauma, GCS<=14 (Ped 0-17y); Polytrauma, blunt EXAM: CT HEAD WITHOUT CONTRAST CT CERVICAL SPINE WITHOUT CONTRAST TECHNIQUE: Multidetector CT imaging of the head and cervical spine was performed following the standard protocol without  intravenous contrast. Multiplanar CT image reconstructions of the cervical spine were also generated. RADIATION DOSE REDUCTION: This exam was performed according to the departmental dose-optimization program which includes automated exposure control, adjustment of the mA and/or kV according to patient size and/or use of iterative reconstruction technique. COMPARISON:  None Available. FINDINGS: CT HEAD FINDINGS Brain: No evidence of large-territorial acute infarction. No parenchymal hemorrhage. No mass lesion. No extra-axial collection. No mass effect or midline shift. No hydrocephalus. Basilar cisterns are patent. Vascular: No hyperdense vessel. Skull: No acute fracture or focal lesion. Sinuses/Orbits: Paranasal sinuses and mastoid air cells are clear. Bilateral lens replacement. Otherwise the orbits are unremarkable. Other: None. CT CERVICAL SPINE FINDINGS Alignment: Normal. Skull base and vertebrae: Acute displaced left C7 transverse process fracture. No aggressive appearing focal osseous lesion or focal pathologic process. Soft tissues and spinal canal: No prevertebral fluid or swelling. No visible canal hematoma. Upper chest: Trace apical left pneumothorax. Other: Acute displaced left T1 transverse process fracture. Acute displaced posterior 1-3 left rib fracture. Subcutaneus soft tissue emphysema of the left neck and visualized shoulder/back. Acute displaced and comminuted fractured  left distal clavicle. IMPRESSION: 1. No acute intracranial abnormality. 2. Acute displaced left C7 transverse process fracture. 3. Acute displaced left T1 transverse process fracture. 4. Acute displaced posterior 1-3 left rib fracture. 5. Trace apical left pneumothorax. 6. Acute displaced and comminuted fractured left distal clavicle. 7. Please see separately dictated CT chest and CT thoracic spine 08/26/2023. These results were called by telephone at the time of interpretation on 08/26/2023 at 7:08 pm to provider Pricilla Loveless ,  who verbally acknowledged these results. Electronically Signed   By: Tish Frederickson M.D.   On: 08/26/2023 19:10     Ephriam Knuckles, AGNP-C Nurse Practitioner 08/26/2023, 8:08 PM   [facility] [address], [place], [place] [address] P: [phone]    F: [phone]

## 2023-08-26 NOTE — Plan of Care (Signed)
  Problem: Education: Goal: Knowledge of General Education information will improve Description Including pain rating scale, medication(s)/side effects and non-pharmacologic comfort measures Outcome: Progressing   Problem: Clinical Measurements: Goal: Ability to maintain clinical measurements within normal limits will improve Outcome: Progressing Goal: Will remain free from infection Outcome: Progressing   Problem: Activity: Goal: Risk for activity intolerance will decrease Outcome: Progressing   Problem: Nutrition: Goal: Adequate nutrition will be maintained Outcome: Progressing   Problem: Elimination: Goal: Will not experience complications related to bowel motility Outcome: Progressing Goal: Will not experience complications related to urinary retention Outcome: Progressing   Problem: Pain Managment: Goal: General experience of comfort will improve Outcome: Progressing   Problem: Safety: Goal: Ability to remain free from injury will improve Outcome: Progressing   Problem: Skin Integrity: Goal: Risk for impaired skin integrity will decrease Outcome: Progressing

## 2023-08-26 NOTE — Progress Notes (Signed)
Orthopedic Note  Called for consult on a closed left clavicle fracture. XRs show a comminuted distal clavicle fracture that appears to be in good alignment. Will attempt non-operative treatment. Non weight bearing in a sling on the left upper extremity. Should follow up in office in 1 week.   London Sheer, MD Orthopedic Surgeon

## 2023-08-26 NOTE — Progress Notes (Signed)
Orthopedic Tech Progress Note Patient Details:  Erik Holmes 28-Feb-1961 [id number] Level 2 Trauma  Patient ID: Erik Gammon., male   DOB: 11-27-1960, 62 y.o.   MRN: [id number]  Erik Holmes 08/26/2023, 5:52 PM

## 2023-08-26 NOTE — ED Provider Notes (Signed)
[place] EMERGENCY DEPARTMENT AT [facility] Provider Note   CSN: [id number] Arrival date & time: 08/26/23  1719     History  Chief Complaint  Patient presents with   [name]. is a 62 y.o. male.   Fall  62 year old male presenting as a level 2 trauma after fall from intact.  Wife saw him fall and reported that he fell from a 3 story deck, estimating 15 to 20 feet in the air.  She is unclear how he fell or landed, but seem to fall into bushes.  Patient himself does not remember the fall.  He is complaining of diffuse pain throughout his body.  He is not taking any blood thinners.       Home Medications Prior to Admission medications   Medication Sig Start Date End Date Taking? Authorizing Provider  busPIRone (BUSPAR) 15 MG tablet 1 bid 08/01/23  Yes Corie Chiquito, PMHNP  diazepam (VALIUM) 5 MG tablet Take 1 tablet (5 mg total) by mouth 2 (two) times daily as needed for anxiety. 04/28/23  Yes Corie Chiquito, PMHNP  EPINEPHrine (EPIPEN 2-PAK) 0.3 mg/0.3 mL IJ SOAJ injection Take 0.3 mg by mouth daily as needed for anaphylaxis.   Yes [provider]  lithium carbonate 300 MG capsule 1qam and 2qhs Patient taking differently: Take 300 mg by mouth 2 (two) times daily. 1qam and 2qhs 08/01/23  Yes Corie Chiquito, PMHNP  metFORMIN (GLUCOPHAGE-XR) 500 MG 24 hr tablet Take 1,000 mg by mouth 2 (two) times a day.  01/21/19  Yes [provider]  pioglitazone (ACTOS) 15 MG tablet Take 15 mg by mouth daily.   Yes [provider]  rosuvastatin (CRESTOR) 5 MG tablet Take 5 mg by mouth daily. 01/21/19  Yes [provider]  venlafaxine XR (EFFEXOR-XR) 150 MG 24 hr capsule TAKE 2 CAPSULES BY MOUTH DAILY  WITH BREAKFAST 08/01/23  Yes Corie Chiquito, PMHNP  NON FORMULARY CPAP AT BEDTIME 02/24/10   [provider]      Allergies    Penicillins, Codeine, Other, Sildenafil citrate, and Xanax [alprazolam]    Review of Systems    Review of Systems  Physical Exam Updated Vital Signs BP (!) 167/93   Pulse 98   Temp 97.6 F (36.4 C) (Oral)   Resp (!) 23   Ht 5\' 10"  (1.778 m)   Wt 95.3 kg   SpO2 92%   BMI 30.13 kg/m  Physical Exam Physical Exam Constitutional Nursing notes reviewed Vital signs reviewed  Head No obvious trauma No skull depressions or lacerations Small abrasion to pinna of left ear  ENT PERRL No conjunctival hemorrhage No periorbital ecchymoses, [place] Eyes, or Battle Sign bilaterally Ears atraumatic No nasal septal deviation or hematoma Mouth and tongue atraumatic Trachea midline.   Neck No C spine stepoffs, deformities C-spine tenderness C collar in place  Chest Clavicles atraumatic Clavicles stable to anterior compression without crepitus Chest wall with symmetric expansion Chest wall stable to anterior and lateral compression without crepitus  Respiratory Effort normal CTAB No respiratory distress  CV Normal rate DP and radial pulses 2+ and equal bilaterally  Abdomen Soft Non-tender Non-distended No peritonitis No abrasions/contusions  GU Atraumatic No gross blood  MSK Atraumatic No obvious deformity ROM appropriate Pelvis stable to lateral compression  Back Tenderness to T and L-spine No step offs or deformities   Skin Warm Dry Abrasions to left arm, left flank with tenderness  Neuro Awake and alert Moving all  extremities GCS 14 (confusion)     ED Results / Procedures / Treatments   Labs (all labs ordered are listed, but only abnormal results are displayed) Labs Reviewed  COMPREHENSIVE METABOLIC PANEL - Abnormal; Notable for the following components:      Result Value   Sodium 132 (*)    Chloride 97 (*)    Glucose, Bld 426 (*)    Creatinine, Ser 1.28 (*)    AST 42 (*)    All other components within normal limits  CBC - Abnormal; Notable for the following components:   WBC 21.5 (*)    All other components within normal limits  URINALYSIS, ROUTINE W  REFLEX MICROSCOPIC - Abnormal; Notable for the following components:   Specific Gravity, Urine 1.032 (*)    Glucose, UA >=500 (*)    Hgb urine dipstick MODERATE (*)    All other components within normal limits  I-STAT CG4 LACTIC ACID, ED - Abnormal; Notable for the following components:   Lactic Acid, Venous 2.1 (*)    All other components within normal limits  I-STAT CHEM 8, ED - Abnormal; Notable for the following components:   Sodium 134 (*)    Glucose, Bld 425 (*)    Calcium, Ion 1.10 (*)    TCO2 21 (*)    Hemoglobin 17.7 (*)    All other components within normal limits  APTT  ETHANOL  PROTIME-INR  HIV ANTIBODY (ROUTINE TESTING W REFLEX)  CBC  BASIC METABOLIC PANEL  I-STAT CHEM 8, ED  I-STAT CG4 LACTIC ACID, ED  TYPE AND SCREEN    EKG None  Radiology CT CHEST ABDOMEN PELVIS W CONTRAST  Result Date: 08/26/2023 CLINICAL DATA:  Trauma, fall from deck 20 feet fall, abrasions to left flank EXAM: CT CHEST, ABDOMEN, AND PELVIS WITH CONTRAST CT THORACIC AND LUMBAR SPINE WITH CONTRAST TECHNIQUE: Multidetector CT imaging of the chest, abdomen and pelvis was performed following the standard protocol during bolus administration of intravenous contrast. Multidetector CT imaging of the thoracic and lumbar spine was performed following the standard protocol during bolus administration of intravenous contrast. RADIATION DOSE REDUCTION: This exam was performed according to the departmental dose-optimization program which includes automated exposure control, adjustment of the mA and/or kV according to patient size and/or use of iterative reconstruction technique. CONTRAST:  75mL OMNIPAQUE IOHEXOL 350 MG/ML SOLN COMPARISON:  None Available. FINDINGS: CT CHEST FINDINGS Cardiovascular: Aortic atherosclerosis. Heterogeneous, intermediate attenuation lesion in the anterior mediastinum overlying the ascending aorta and pulmonary outflow tract, measuring 3.5 x 2.7 cm with adjacent fat stranding (series 3,  image 33). Normal heart size. No pericardial effusion. Mediastinum/Nodes: Pneumomediastinum in the anterior and superior mediastinum (series 4, image 62). No enlarged mediastinal, hilar, or axillary lymph nodes. Thyroid gland, trachea, and esophagus demonstrate no significant findings. Lungs/Pleura: Small left pneumothorax, no greater than 10% volume. Small left pleural effusion. Musculoskeletal: No chest wall mass or suspicious osseous lesions identified. Mildly displaced, comminuted fractures of the distal third of the left clavicle (series 3, image 5). Multiple minimally displaced fractures of the left first through fifth ribs. Subcutaneous emphysema about the left chest wall. CT ABDOMEN PELVIS FINDINGS Hepatobiliary: No solid liver abnormality is seen. No gallstones, gallbladder wall thickening, or biliary dilatation. Pancreas: Unremarkable. No pancreatic ductal dilatation or surrounding inflammatory changes. Spleen: Normal in size without significant abnormality. Adrenals/Urinary Tract: Adrenal glands are unremarkable. Simple, benign right parapelvic renal cysts, for which no further follow-up or characterization is required. Kidneys are otherwise normal, without renal calculi, solid  lesion, or hydronephrosis. Bladder is unremarkable. Stomach/Bowel: Stomach is within normal limits. Appendix appears diminutive although otherwise normal. No evidence of bowel wall thickening, distention, or inflammatory changes. Moderate burden of stool and stool balls throughout the colon and rectum. Vascular/Lymphatic: Aortic atherosclerosis. No enlarged abdominal or pelvic lymph nodes. Reproductive: No mass or other abnormality. Other: Small, fat containing bilateral inguinal hernias. No ascites. Musculoskeletal: No acute osseous findings. CT THORACIC AND LUMBAR SPINE FINDINGS Alignment: Normal thoracic kyphosis. Normal lumbar lordosis. Vertebral bodies: Multiple nondisplaced and mildly displaced fractures of the left thoracic  transverse processes, involving at least T1 through T9. No fractures of the vertebral bodies proper or other posterior elements. Disc spaces: Status post lumbar discectomy and fusion of L3-L5. Mild disc space height loss and osteophytosis throughout the thoracic spine. Remaining anatomic lumbar disc spaces are generally preserved. Paraspinous soft tissues: Unremarkable. IMPRESSION: 1. Multiple minimally displaced fractures of the left first through fifth ribs as well as multiple nondisplaced and mildly displaced fractures of the left thoracic transverse processes, involving at least T1 through T9. 2. Mildly displaced, comminuted fractures of the distal third of the left clavicle. 3. Small left pneumothorax, no greater than 10% volume. 4. Small simple fluid attenuation of the left pleural effusion. 5. Pneumomediastinum and subcutaneous emphysema about the left chest. 6. Heterogeneous, intermediate attenuation lesion in the anterior mediastinum overlying the ascending aorta and pulmonary outflow tract, measuring 3.5 x 2.7 cm with adjacent fat stranding. This is most consistent with a hematoma, although of uncertain origin, and appears to be separated from the aortic arch by a fat plane. Recommend vascular consultation and short interval follow-up to assess for stability. 7. No CT evidence of acute traumatic injury to the abdomen or pelvis. These results were called by telephone at the time of interpretation on 08/26/2023 at 7:15 pm to Dr. Pricilla Loveless , who verbally acknowledged these results. Aortic Atherosclerosis (ICD10-I70.0). Electronically Signed   By: Jearld Lesch M.D.   On: 08/26/2023 19:22   CT T-SPINE NO CHARGE  Result Date: 08/26/2023 CLINICAL DATA:  Trauma, fall from deck 20 feet fall, abrasions to left flank EXAM: CT CHEST, ABDOMEN, AND PELVIS WITH CONTRAST CT THORACIC AND LUMBAR SPINE WITH CONTRAST TECHNIQUE: Multidetector CT imaging of the chest, abdomen and pelvis was performed following the  standard protocol during bolus administration of intravenous contrast. Multidetector CT imaging of the thoracic and lumbar spine was performed following the standard protocol during bolus administration of intravenous contrast. RADIATION DOSE REDUCTION: This exam was performed according to the departmental dose-optimization program which includes automated exposure control, adjustment of the mA and/or kV according to patient size and/or use of iterative reconstruction technique. CONTRAST:  75mL OMNIPAQUE IOHEXOL 350 MG/ML SOLN COMPARISON:  None Available. FINDINGS: CT CHEST FINDINGS Cardiovascular: Aortic atherosclerosis. Heterogeneous, intermediate attenuation lesion in the anterior mediastinum overlying the ascending aorta and pulmonary outflow tract, measuring 3.5 x 2.7 cm with adjacent fat stranding (series 3, image 33). Normal heart size. No pericardial effusion. Mediastinum/Nodes: Pneumomediastinum in the anterior and superior mediastinum (series 4, image 62). No enlarged mediastinal, hilar, or axillary lymph nodes. Thyroid gland, trachea, and esophagus demonstrate no significant findings. Lungs/Pleura: Small left pneumothorax, no greater than 10% volume. Small left pleural effusion. Musculoskeletal: No chest wall mass or suspicious osseous lesions identified. Mildly displaced, comminuted fractures of the distal third of the left clavicle (series 3, image 5). Multiple minimally displaced fractures of the left first through fifth ribs. Subcutaneous emphysema about the left chest wall. CT ABDOMEN PELVIS  FINDINGS Hepatobiliary: No solid liver abnormality is seen. No gallstones, gallbladder wall thickening, or biliary dilatation. Pancreas: Unremarkable. No pancreatic ductal dilatation or surrounding inflammatory changes. Spleen: Normal in size without significant abnormality. Adrenals/Urinary Tract: Adrenal glands are unremarkable. Simple, benign right parapelvic renal cysts, for which no further follow-up or  characterization is required. Kidneys are otherwise normal, without renal calculi, solid lesion, or hydronephrosis. Bladder is unremarkable. Stomach/Bowel: Stomach is within normal limits. Appendix appears diminutive although otherwise normal. No evidence of bowel wall thickening, distention, or inflammatory changes. Moderate burden of stool and stool balls throughout the colon and rectum. Vascular/Lymphatic: Aortic atherosclerosis. No enlarged abdominal or pelvic lymph nodes. Reproductive: No mass or other abnormality. Other: Small, fat containing bilateral inguinal hernias. No ascites. Musculoskeletal: No acute osseous findings. CT THORACIC AND LUMBAR SPINE FINDINGS Alignment: Normal thoracic kyphosis. Normal lumbar lordosis. Vertebral bodies: Multiple nondisplaced and mildly displaced fractures of the left thoracic transverse processes, involving at least T1 through T9. No fractures of the vertebral bodies proper or other posterior elements. Disc spaces: Status post lumbar discectomy and fusion of L3-L5. Mild disc space height loss and osteophytosis throughout the thoracic spine. Remaining anatomic lumbar disc spaces are generally preserved. Paraspinous soft tissues: Unremarkable. IMPRESSION: 1. Multiple minimally displaced fractures of the left first through fifth ribs as well as multiple nondisplaced and mildly displaced fractures of the left thoracic transverse processes, involving at least T1 through T9. 2. Mildly displaced, comminuted fractures of the distal third of the left clavicle. 3. Small left pneumothorax, no greater than 10% volume. 4. Small simple fluid attenuation of the left pleural effusion. 5. Pneumomediastinum and subcutaneous emphysema about the left chest. 6. Heterogeneous, intermediate attenuation lesion in the anterior mediastinum overlying the ascending aorta and pulmonary outflow tract, measuring 3.5 x 2.7 cm with adjacent fat stranding. This is most consistent with a hematoma, although  of uncertain origin, and appears to be separated from the aortic arch by a fat plane. Recommend vascular consultation and short interval follow-up to assess for stability. 7. No CT evidence of acute traumatic injury to the abdomen or pelvis. These results were called by telephone at the time of interpretation on 08/26/2023 at 7:15 pm to Dr. Pricilla Loveless , who verbally acknowledged these results. Aortic Atherosclerosis (ICD10-I70.0). Electronically Signed   By: Jearld Lesch M.D.   On: 08/26/2023 19:22   CT L-SPINE NO CHARGE  Result Date: 08/26/2023 CLINICAL DATA:  Trauma, fall from deck 20 feet fall, abrasions to left flank EXAM: CT CHEST, ABDOMEN, AND PELVIS WITH CONTRAST CT THORACIC AND LUMBAR SPINE WITH CONTRAST TECHNIQUE: Multidetector CT imaging of the chest, abdomen and pelvis was performed following the standard protocol during bolus administration of intravenous contrast. Multidetector CT imaging of the thoracic and lumbar spine was performed following the standard protocol during bolus administration of intravenous contrast. RADIATION DOSE REDUCTION: This exam was performed according to the departmental dose-optimization program which includes automated exposure control, adjustment of the mA and/or kV according to patient size and/or use of iterative reconstruction technique. CONTRAST:  75mL OMNIPAQUE IOHEXOL 350 MG/ML SOLN COMPARISON:  None Available. FINDINGS: CT CHEST FINDINGS Cardiovascular: Aortic atherosclerosis. Heterogeneous, intermediate attenuation lesion in the anterior mediastinum overlying the ascending aorta and pulmonary outflow tract, measuring 3.5 x 2.7 cm with adjacent fat stranding (series 3, image 33). Normal heart size. No pericardial effusion. Mediastinum/Nodes: Pneumomediastinum in the anterior and superior mediastinum (series 4, image 62). No enlarged mediastinal, hilar, or axillary lymph nodes. Thyroid gland, trachea, and esophagus demonstrate no  significant findings.  Lungs/Pleura: Small left pneumothorax, no greater than 10% volume. Small left pleural effusion. Musculoskeletal: No chest wall mass or suspicious osseous lesions identified. Mildly displaced, comminuted fractures of the distal third of the left clavicle (series 3, image 5). Multiple minimally displaced fractures of the left first through fifth ribs. Subcutaneous emphysema about the left chest wall. CT ABDOMEN PELVIS FINDINGS Hepatobiliary: No solid liver abnormality is seen. No gallstones, gallbladder wall thickening, or biliary dilatation. Pancreas: Unremarkable. No pancreatic ductal dilatation or surrounding inflammatory changes. Spleen: Normal in size without significant abnormality. Adrenals/Urinary Tract: Adrenal glands are unremarkable. Simple, benign right parapelvic renal cysts, for which no further follow-up or characterization is required. Kidneys are otherwise normal, without renal calculi, solid lesion, or hydronephrosis. Bladder is unremarkable. Stomach/Bowel: Stomach is within normal limits. Appendix appears diminutive although otherwise normal. No evidence of bowel wall thickening, distention, or inflammatory changes. Moderate burden of stool and stool balls throughout the colon and rectum. Vascular/Lymphatic: Aortic atherosclerosis. No enlarged abdominal or pelvic lymph nodes. Reproductive: No mass or other abnormality. Other: Small, fat containing bilateral inguinal hernias. No ascites. Musculoskeletal: No acute osseous findings. CT THORACIC AND LUMBAR SPINE FINDINGS Alignment: Normal thoracic kyphosis. Normal lumbar lordosis. Vertebral bodies: Multiple nondisplaced and mildly displaced fractures of the left thoracic transverse processes, involving at least T1 through T9. No fractures of the vertebral bodies proper or other posterior elements. Disc spaces: Status post lumbar discectomy and fusion of L3-L5. Mild disc space height loss and osteophytosis throughout the thoracic spine. Remaining  anatomic lumbar disc spaces are generally preserved. Paraspinous soft tissues: Unremarkable. IMPRESSION: 1. Multiple minimally displaced fractures of the left first through fifth ribs as well as multiple nondisplaced and mildly displaced fractures of the left thoracic transverse processes, involving at least T1 through T9. 2. Mildly displaced, comminuted fractures of the distal third of the left clavicle. 3. Small left pneumothorax, no greater than 10% volume. 4. Small simple fluid attenuation of the left pleural effusion. 5. Pneumomediastinum and subcutaneous emphysema about the left chest. 6. Heterogeneous, intermediate attenuation lesion in the anterior mediastinum overlying the ascending aorta and pulmonary outflow tract, measuring 3.5 x 2.7 cm with adjacent fat stranding. This is most consistent with a hematoma, although of uncertain origin, and appears to be separated from the aortic arch by a fat plane. Recommend vascular consultation and short interval follow-up to assess for stability. 7. No CT evidence of acute traumatic injury to the abdomen or pelvis. These results were called by telephone at the time of interpretation on 08/26/2023 at 7:15 pm to Dr. Pricilla Loveless , who verbally acknowledged these results. Aortic Atherosclerosis (ICD10-I70.0). Electronically Signed   By: Jearld Lesch M.D.   On: 08/26/2023 19:22   DG Pelvis Portable  Result Date: 08/26/2023 CLINICAL DATA:  trauma EXAM: PORTABLE PELVIS 1-2 VIEWS COMPARISON:  CT abdomen pelvis 08/26/2023 FINDINGS: There is no evidence of pelvic fracture or diastasis. No acute displaced fracture or dislocation of either hips on frontal view. Visualized lower lumbar spine surgical hardware. No pelvic bone lesions are seen. IMPRESSION: Negative for acute traumatic injury. Electronically Signed   By: Tish Frederickson M.D.   On: 08/26/2023 19:18   DG Chest Portable 1 View  Result Date: 08/26/2023 CLINICAL DATA:  trauma.  Fall off deck about 20 feet.  EXAM: PORTABLE CHEST 1 VIEW COMPARISON:  CT chest 08/26/2023 FINDINGS: The heart and mediastinal contours are within normal limits. Atherosclerotic plaque. No focal consolidation. No pulmonary edema. No pleural effusion. Known left pneumothorax  better evaluated on CT chest 08/26/2023. No right pneumothorax. Multiple acute left rib fractures better evaluated on CT chest 08/26/2023. Acute minimally displaced left T1 transverse process fracture. Acute comminuted and displaced distal left clavicular fracture. IMPRESSION: 1. Known trace to small volume left pneumothorax better evaluated on CT chest 08/26/2023. 2. Multiple acute left rib fractures better evaluated on CT chest 08/26/2023. 3. Acute minimally displaced left T1 transverse process fracture. 4. Acute comminuted and displaced distal left clavicular fracture. 5.  Aortic Atherosclerosis (ICD10-I70.0). These results were called by telephone at the time of interpretation on 08/26/2023 at 7:08 pm to provider Pricilla Loveless , who verbally acknowledged these results. Electronically Signed   By: Tish Frederickson M.D.   On: 08/26/2023 19:16   CT Head Wo Contrast  Result Date: 08/26/2023 CLINICAL DATA:  Head trauma, GCS<=14 (Ped 0-17y); Polytrauma, blunt EXAM: CT HEAD WITHOUT CONTRAST CT CERVICAL SPINE WITHOUT CONTRAST TECHNIQUE: Multidetector CT imaging of the head and cervical spine was performed following the standard protocol without intravenous contrast. Multiplanar CT image reconstructions of the cervical spine were also generated. RADIATION DOSE REDUCTION: This exam was performed according to the departmental dose-optimization program which includes automated exposure control, adjustment of the mA and/or kV according to patient size and/or use of iterative reconstruction technique. COMPARISON:  None Available. FINDINGS: CT HEAD FINDINGS Brain: No evidence of large-territorial acute infarction. No parenchymal hemorrhage. No mass lesion. No extra-axial collection.  No mass effect or midline shift. No hydrocephalus. Basilar cisterns are patent. Vascular: No hyperdense vessel. Skull: No acute fracture or focal lesion. Sinuses/Orbits: Paranasal sinuses and mastoid air cells are clear. Bilateral lens replacement. Otherwise the orbits are unremarkable. Other: None. CT CERVICAL SPINE FINDINGS Alignment: Normal. Skull base and vertebrae: Acute displaced left C7 transverse process fracture. No aggressive appearing focal osseous lesion or focal pathologic process. Soft tissues and spinal canal: No prevertebral fluid or swelling. No visible canal hematoma. Upper chest: Trace apical left pneumothorax. Other: Acute displaced left T1 transverse process fracture. Acute displaced posterior 1-3 left rib fracture. Subcutaneus soft tissue emphysema of the left neck and visualized shoulder/back. Acute displaced and comminuted fractured left distal clavicle. IMPRESSION: 1. No acute intracranial abnormality. 2. Acute displaced left C7 transverse process fracture. 3. Acute displaced left T1 transverse process fracture. 4. Acute displaced posterior 1-3 left rib fracture. 5. Trace apical left pneumothorax. 6. Acute displaced and comminuted fractured left distal clavicle. 7. Please see separately dictated CT chest and CT thoracic spine 08/26/2023. These results were called by telephone at the time of interpretation on 08/26/2023 at 7:08 pm to provider Pricilla Loveless , who verbally acknowledged these results. Electronically Signed   By: Tish Frederickson M.D.   On: 08/26/2023 19:10   CT CERVICAL SPINE WO CONTRAST  Result Date: 08/26/2023 CLINICAL DATA:  Head trauma, GCS<=14 (Ped 0-17y); Polytrauma, blunt EXAM: CT HEAD WITHOUT CONTRAST CT CERVICAL SPINE WITHOUT CONTRAST TECHNIQUE: Multidetector CT imaging of the head and cervical spine was performed following the standard protocol without intravenous contrast. Multiplanar CT image reconstructions of the cervical spine were also generated. RADIATION  DOSE REDUCTION: This exam was performed according to the departmental dose-optimization program which includes automated exposure control, adjustment of the mA and/or kV according to patient size and/or use of iterative reconstruction technique. COMPARISON:  None Available. FINDINGS: CT HEAD FINDINGS Brain: No evidence of large-territorial acute infarction. No parenchymal hemorrhage. No mass lesion. No extra-axial collection. No mass effect or midline shift. No hydrocephalus. Basilar cisterns are patent. Vascular: No hyperdense vessel.  Skull: No acute fracture or focal lesion. Sinuses/Orbits: Paranasal sinuses and mastoid air cells are clear. Bilateral lens replacement. Otherwise the orbits are unremarkable. Other: None. CT CERVICAL SPINE FINDINGS Alignment: Normal. Skull base and vertebrae: Acute displaced left C7 transverse process fracture. No aggressive appearing focal osseous lesion or focal pathologic process. Soft tissues and spinal canal: No prevertebral fluid or swelling. No visible canal hematoma. Upper chest: Trace apical left pneumothorax. Other: Acute displaced left T1 transverse process fracture. Acute displaced posterior 1-3 left rib fracture. Subcutaneus soft tissue emphysema of the left neck and visualized shoulder/back. Acute displaced and comminuted fractured left distal clavicle. IMPRESSION: 1. No acute intracranial abnormality. 2. Acute displaced left C7 transverse process fracture. 3. Acute displaced left T1 transverse process fracture. 4. Acute displaced posterior 1-3 left rib fracture. 5. Trace apical left pneumothorax. 6. Acute displaced and comminuted fractured left distal clavicle. 7. Please see separately dictated CT chest and CT thoracic spine 08/26/2023. These results were called by telephone at the time of interpretation on 08/26/2023 at 7:08 pm to provider Pricilla Loveless , who verbally acknowledged these results. Electronically Signed   By: Tish Frederickson M.D.   On: 08/26/2023 19:10     Procedures Procedures    Medications Ordered in ED Medications  acetaminophen (TYLENOL) tablet 1,000 mg (has no administration in time range)  oxyCODONE (Oxy IR/ROXICODONE) immediate release tablet 5-10 mg (has no administration in time range)  morphine (PF) 4 MG/ML injection 4 mg (has no administration in time range)  methocarbamol (ROBAXIN) tablet 500 mg (has no administration in time range)    Or  methocarbamol (ROBAXIN) 500 mg in dextrose 5 % 50 mL IVPB (has no administration in time range)  docusate sodium (COLACE) capsule 100 mg (has no administration in time range)  polyethylene glycol (MIRALAX / GLYCOLAX) packet 17 g (has no administration in time range)  ondansetron (ZOFRAN-ODT) disintegrating tablet 4 mg (has no administration in time range)    Or  ondansetron (ZOFRAN) injection 4 mg (has no administration in time range)  metoprolol tartrate (LOPRESSOR) injection 5 mg (has no administration in time range)  hydrALAZINE (APRESOLINE) injection 10 mg (has no administration in time range)  enoxaparin (LOVENOX) injection 30 mg (has no administration in time range)  lidocaine (LIDODERM) 5 % 2 patch (has no administration in time range)  ketorolac (TORADOL) 15 MG/ML injection 30 mg (has no administration in time range)  iohexol (OMNIPAQUE) 350 MG/ML injection 75 mL (75 mLs Intravenous Contrast Given 08/26/23 1839)    ED Course/ Medical Decision Making/ A&P                                 Medical Decision Making Amount and/or Complexity of Data Reviewed Labs: ordered. Radiology: ordered.  Risk Prescription drug management. Decision regarding hospitalization.   Erik Holmes. is a 62 y.o. male with significant PMH as above who presented to the ED as a level 2 trauma secondary to fall 15-20 feet from deck.  ABCs intact. GCS 15.  Afebrile, hemodynamically stable. PE as below, notable for confusion, abrasions to left arm and left flank and diffuse tenderness.    Differential includes but is not limited to: subdural hematoma, epidural hematoma, DAI, SAH, other TBI, cervical spine injury, other spinal injury, perforated viscous, internal bleeding, rib fractures, pneumothorax, hemothorax, and other life/limb threatening traumatic injuries  Full trauma scans were performed, significant findings include numerous acute displaced transverse process  fractures, several displaced left-sided rib fractures with an accompanying trace left apical pneumothorax.  He additionally has significant amount of pneumomediastinum and subcutaneous emphysema throughout the left chest.  There is also an indeterminate lesion seen over the anterior mediastinum that could represent hematoma of uncertain origin.  Discussed these findings with the trauma surgery team I did not feel that this was of aortic origin.  Will nevertheless obtain EKG and placed on telemetry.  Given the patient's clinical picture, I do feel that Trauma Surgery should be consulted.  Given patient's degree of injuries, he will be admitted to the trauma surgery team for continued evaluation and management. He required no additional emergent procedures while in the emergency department that his vital signs remained stable with no respiratory distress.   The plan for this patient was discussed with Dr. Criss Alvine, who voiced agreement and who oversaw evaluation and treatment of this patient.     Final Clinical Impression(s) / ED Diagnoses Final diagnoses:  Fall, initial encounter    Rx / DC Orders ED Discharge Orders     None         Lyman Speller, MD 08/26/23 2230    Pricilla Loveless, MD 09/04/23 1540

## 2023-08-27 ENCOUNTER — Inpatient Hospital Stay (HOSPITAL_COMMUNITY): Payer: Medicare Other

## 2023-08-27 DIAGNOSIS — S42032A Displaced fracture of lateral end of left clavicle, initial encounter for closed fracture: Secondary | ICD-10-CM

## 2023-08-27 LAB — CBC
HCT: 43.9 % (ref 39.0–52.0)
Hemoglobin: 14.9 g/dL (ref 13.0–17.0)
MCH: 30.8 pg (ref 26.0–34.0)
MCHC: 33.9 g/dL (ref 30.0–36.0)
MCV: 90.9 fL (ref 80.0–100.0)
Platelets: 263 10*3/uL (ref 150–400)
RBC: 4.83 MIL/uL (ref 4.22–5.81)
RDW: 12.9 % (ref 11.5–15.5)
WBC: 10.8 10*3/uL — ABNORMAL HIGH (ref 4.0–10.5)
nRBC: 0 % (ref 0.0–0.2)

## 2023-08-27 LAB — BASIC METABOLIC PANEL
Anion gap: 9 (ref 5–15)
BUN: 12 mg/dL (ref 8–23)
CO2: 25 mmol/L (ref 22–32)
Calcium: 8.5 mg/dL — ABNORMAL LOW (ref 8.9–10.3)
Chloride: 101 mmol/L (ref 98–111)
Creatinine, Ser: 1.04 mg/dL (ref 0.61–1.24)
GFR, Estimated: >60 mL/min (ref >60)
Glucose, Bld: 242 mg/dL — ABNORMAL HIGH (ref 70–99)
Potassium: 4 mmol/L (ref 3.5–5.1)
Sodium: 135 mmol/L (ref 135–145)

## 2023-08-27 NOTE — Progress Notes (Signed)
Subjective/Chief Complaint: Pt sore all over amnestic to event    Objective: Vital signs in last 24 hours: Temp:  [97.6 F (36.4 C)-98.8 F (37.1 C)] 98.2 F (36.8 C) (10/06 0750) Pulse Rate:  [78-98] 79 (10/06 0750) Resp:  [16-23] 16 (10/06 0750) BP: (115-167)/(77-106) 138/77 (10/06 0750) SpO2:  [91 %-96 %] 91 % (10/06 0750) Weight:  [95.3 kg] 95.3 kg (10/05 2145) Last BM Date : 08/25/23  Intake/Output from previous day: 10/05 0701 - 10/06 0700 In: 172.9 [P.O.:120; IV Piggyback:52.9] Out: 600 [Urine:600] Intake/Output this shift: No intake/output data recorded.  General appearance: alert and cooperative Neck: in C collar  Resp: No increase WOB Cardio: nsr  Extremities: bruising and swelling left clavicle  Neurologic: Mental status: alertness: lethargic  Lab Results:  Recent Labs    08/26/23 1740 08/26/23 1756  WBC 21.5*  --   HGB 16.9 17.7*  HCT 50.2 52.0  PLT 350  --    BMET Recent Labs    08/26/23 1740 08/26/23 1756  NA 132* 134*  K 3.7 3.9  CL 97* 101  CO2 25  --   GLUCOSE 426* 425*  BUN 15 19  CREATININE 1.28* 1.00  CALCIUM 9.2  --    PT/INR Recent Labs    08/26/23 1740  LABPROT 13.4  INR 1.0   ABG No results for input(s): "PHART", "HCO3" in the last 72 hours.  Invalid input(s): "PCO2", "PO2"  Studies/Results: DG Clavicle Left  Result Date: 08/26/2023 CLINICAL DATA:  Fall, left clavicle pain EXAM: LEFT CLAVICLE - 2+ VIEWS COMPARISON:  Same day CT chest abdomen pelvis FINDINGS: Comminuted, mildly displaced fractures of the distal third of the left clavicle, as previously reported by CT. The acromioclavicular and coracoclavicular intervals appear preserved. No fracture or dislocation of the scapula or proximal left humerus. Subcutaneous emphysema about the left chest; previously reported left rib fractures better appreciated by CT. IMPRESSION: 1. Comminuted, mildly displaced fractures of the distal third of the left clavicle, as  previously reported by CT. The acromioclavicular and coracoclavicular intervals appear preserved. 2. No fracture or dislocation of the scapula or proximal left humerus. 3. Subcutaneous emphysema about the left chest; previously reported left rib fractures better appreciated by CT. Electronically Signed   By: Jearld Lesch M.D.   On: 08/26/2023 21:03   CT CHEST ABDOMEN PELVIS W CONTRAST  Result Date: 08/26/2023 CLINICAL DATA:  Trauma, fall from deck 20 feet fall, abrasions to left flank EXAM: CT CHEST, ABDOMEN, AND PELVIS WITH CONTRAST CT THORACIC AND LUMBAR SPINE WITH CONTRAST TECHNIQUE: Multidetector CT imaging of the chest, abdomen and pelvis was performed following the standard protocol during bolus administration of intravenous contrast. Multidetector CT imaging of the thoracic and lumbar spine was performed following the standard protocol during bolus administration of intravenous contrast. RADIATION DOSE REDUCTION: This exam was performed according to the departmental dose-optimization program which includes automated exposure control, adjustment of the mA and/or kV according to patient size and/or use of iterative reconstruction technique. CONTRAST:  75mL OMNIPAQUE IOHEXOL 350 MG/ML SOLN COMPARISON:  None Available. FINDINGS: CT CHEST FINDINGS Cardiovascular: Aortic atherosclerosis. Heterogeneous, intermediate attenuation lesion in the anterior mediastinum overlying the ascending aorta and pulmonary outflow tract, measuring 3.5 x 2.7 cm with adjacent fat stranding (series 3, image 33). Normal heart size. No pericardial effusion. Mediastinum/Nodes: Pneumomediastinum in the anterior and superior mediastinum (series 4, image 62). No enlarged mediastinal, hilar, or axillary lymph nodes. Thyroid gland, trachea, and esophagus demonstrate no significant findings. Lungs/Pleura: Small left  pneumothorax, no greater than 10% volume. Small left pleural effusion. Musculoskeletal: No chest wall mass or suspicious  osseous lesions identified. Mildly displaced, comminuted fractures of the distal third of the left clavicle (series 3, image 5). Multiple minimally displaced fractures of the left first through fifth ribs. Subcutaneous emphysema about the left chest wall. CT ABDOMEN PELVIS FINDINGS Hepatobiliary: No solid liver abnormality is seen. No gallstones, gallbladder wall thickening, or biliary dilatation. Pancreas: Unremarkable. No pancreatic ductal dilatation or surrounding inflammatory changes. Spleen: Normal in size without significant abnormality. Adrenals/Urinary Tract: Adrenal glands are unremarkable. Simple, benign right parapelvic renal cysts, for which no further follow-up or characterization is required. Kidneys are otherwise normal, without renal calculi, solid lesion, or hydronephrosis. Bladder is unremarkable. Stomach/Bowel: Stomach is within normal limits. Appendix appears diminutive although otherwise normal. No evidence of bowel wall thickening, distention, or inflammatory changes. Moderate burden of stool and stool balls throughout the colon and rectum. Vascular/Lymphatic: Aortic atherosclerosis. No enlarged abdominal or pelvic lymph nodes. Reproductive: No mass or other abnormality. Other: Small, fat containing bilateral inguinal hernias. No ascites. Musculoskeletal: No acute osseous findings. CT THORACIC AND LUMBAR SPINE FINDINGS Alignment: Normal thoracic kyphosis. Normal lumbar lordosis. Vertebral bodies: Multiple nondisplaced and mildly displaced fractures of the left thoracic transverse processes, involving at least T1 through T9. No fractures of the vertebral bodies proper or other posterior elements. Disc spaces: Status post lumbar discectomy and fusion of L3-L5. Mild disc space height loss and osteophytosis throughout the thoracic spine. Remaining anatomic lumbar disc spaces are generally preserved. Paraspinous soft tissues: Unremarkable. IMPRESSION: 1. Multiple minimally displaced fractures of the  left first through fifth ribs as well as multiple nondisplaced and mildly displaced fractures of the left thoracic transverse processes, involving at least T1 through T9. 2. Mildly displaced, comminuted fractures of the distal third of the left clavicle. 3. Small left pneumothorax, no greater than 10% volume. 4. Small simple fluid attenuation of the left pleural effusion. 5. Pneumomediastinum and subcutaneous emphysema about the left chest. 6. Heterogeneous, intermediate attenuation lesion in the anterior mediastinum overlying the ascending aorta and pulmonary outflow tract, measuring 3.5 x 2.7 cm with adjacent fat stranding. This is most consistent with a hematoma, although of uncertain origin, and appears to be separated from the aortic arch by a fat plane. Recommend vascular consultation and short interval follow-up to assess for stability. 7. No CT evidence of acute traumatic injury to the abdomen or pelvis. These results were called by telephone at the time of interpretation on 08/26/2023 at 7:15 pm to Dr. Pricilla Loveless , who verbally acknowledged these results. Aortic Atherosclerosis (ICD10-I70.0). Electronically Signed   By: Jearld Lesch M.D.   On: 08/26/2023 19:22   CT T-SPINE NO CHARGE  Result Date: 08/26/2023 CLINICAL DATA:  Trauma, fall from deck 20 feet fall, abrasions to left flank EXAM: CT CHEST, ABDOMEN, AND PELVIS WITH CONTRAST CT THORACIC AND LUMBAR SPINE WITH CONTRAST TECHNIQUE: Multidetector CT imaging of the chest, abdomen and pelvis was performed following the standard protocol during bolus administration of intravenous contrast. Multidetector CT imaging of the thoracic and lumbar spine was performed following the standard protocol during bolus administration of intravenous contrast. RADIATION DOSE REDUCTION: This exam was performed according to the departmental dose-optimization program which includes automated exposure control, adjustment of the mA and/or kV according to patient size  and/or use of iterative reconstruction technique. CONTRAST:  75mL OMNIPAQUE IOHEXOL 350 MG/ML SOLN COMPARISON:  None Available. FINDINGS: CT CHEST FINDINGS Cardiovascular: Aortic atherosclerosis. Heterogeneous, intermediate attenuation lesion  in the anterior mediastinum overlying the ascending aorta and pulmonary outflow tract, measuring 3.5 x 2.7 cm with adjacent fat stranding (series 3, image 33). Normal heart size. No pericardial effusion. Mediastinum/Nodes: Pneumomediastinum in the anterior and superior mediastinum (series 4, image 62). No enlarged mediastinal, hilar, or axillary lymph nodes. Thyroid gland, trachea, and esophagus demonstrate no significant findings. Lungs/Pleura: Small left pneumothorax, no greater than 10% volume. Small left pleural effusion. Musculoskeletal: No chest wall mass or suspicious osseous lesions identified. Mildly displaced, comminuted fractures of the distal third of the left clavicle (series 3, image 5). Multiple minimally displaced fractures of the left first through fifth ribs. Subcutaneous emphysema about the left chest wall. CT ABDOMEN PELVIS FINDINGS Hepatobiliary: No solid liver abnormality is seen. No gallstones, gallbladder wall thickening, or biliary dilatation. Pancreas: Unremarkable. No pancreatic ductal dilatation or surrounding inflammatory changes. Spleen: Normal in size without significant abnormality. Adrenals/Urinary Tract: Adrenal glands are unremarkable. Simple, benign right parapelvic renal cysts, for which no further follow-up or characterization is required. Kidneys are otherwise normal, without renal calculi, solid lesion, or hydronephrosis. Bladder is unremarkable. Stomach/Bowel: Stomach is within normal limits. Appendix appears diminutive although otherwise normal. No evidence of bowel wall thickening, distention, or inflammatory changes. Moderate burden of stool and stool balls throughout the colon and rectum. Vascular/Lymphatic: Aortic atherosclerosis.  No enlarged abdominal or pelvic lymph nodes. Reproductive: No mass or other abnormality. Other: Small, fat containing bilateral inguinal hernias. No ascites. Musculoskeletal: No acute osseous findings. CT THORACIC AND LUMBAR SPINE FINDINGS Alignment: Normal thoracic kyphosis. Normal lumbar lordosis. Vertebral bodies: Multiple nondisplaced and mildly displaced fractures of the left thoracic transverse processes, involving at least T1 through T9. No fractures of the vertebral bodies proper or other posterior elements. Disc spaces: Status post lumbar discectomy and fusion of L3-L5. Mild disc space height loss and osteophytosis throughout the thoracic spine. Remaining anatomic lumbar disc spaces are generally preserved. Paraspinous soft tissues: Unremarkable. IMPRESSION: 1. Multiple minimally displaced fractures of the left first through fifth ribs as well as multiple nondisplaced and mildly displaced fractures of the left thoracic transverse processes, involving at least T1 through T9. 2. Mildly displaced, comminuted fractures of the distal third of the left clavicle. 3. Small left pneumothorax, no greater than 10% volume. 4. Small simple fluid attenuation of the left pleural effusion. 5. Pneumomediastinum and subcutaneous emphysema about the left chest. 6. Heterogeneous, intermediate attenuation lesion in the anterior mediastinum overlying the ascending aorta and pulmonary outflow tract, measuring 3.5 x 2.7 cm with adjacent fat stranding. This is most consistent with a hematoma, although of uncertain origin, and appears to be separated from the aortic arch by a fat plane. Recommend vascular consultation and short interval follow-up to assess for stability. 7. No CT evidence of acute traumatic injury to the abdomen or pelvis. These results were called by telephone at the time of interpretation on 08/26/2023 at 7:15 pm to Dr. Pricilla Loveless , who verbally acknowledged these results. Aortic Atherosclerosis (ICD10-I70.0).  Electronically Signed   By: Jearld Lesch M.D.   On: 08/26/2023 19:22   CT L-SPINE NO CHARGE  Result Date: 08/26/2023 CLINICAL DATA:  Trauma, fall from deck 20 feet fall, abrasions to left flank EXAM: CT CHEST, ABDOMEN, AND PELVIS WITH CONTRAST CT THORACIC AND LUMBAR SPINE WITH CONTRAST TECHNIQUE: Multidetector CT imaging of the chest, abdomen and pelvis was performed following the standard protocol during bolus administration of intravenous contrast. Multidetector CT imaging of the thoracic and lumbar spine was performed following the standard protocol during  bolus administration of intravenous contrast. RADIATION DOSE REDUCTION: This exam was performed according to the departmental dose-optimization program which includes automated exposure control, adjustment of the mA and/or kV according to patient size and/or use of iterative reconstruction technique. CONTRAST:  75mL OMNIPAQUE IOHEXOL 350 MG/ML SOLN COMPARISON:  None Available. FINDINGS: CT CHEST FINDINGS Cardiovascular: Aortic atherosclerosis. Heterogeneous, intermediate attenuation lesion in the anterior mediastinum overlying the ascending aorta and pulmonary outflow tract, measuring 3.5 x 2.7 cm with adjacent fat stranding (series 3, image 33). Normal heart size. No pericardial effusion. Mediastinum/Nodes: Pneumomediastinum in the anterior and superior mediastinum (series 4, image 62). No enlarged mediastinal, hilar, or axillary lymph nodes. Thyroid gland, trachea, and esophagus demonstrate no significant findings. Lungs/Pleura: Small left pneumothorax, no greater than 10% volume. Small left pleural effusion. Musculoskeletal: No chest wall mass or suspicious osseous lesions identified. Mildly displaced, comminuted fractures of the distal third of the left clavicle (series 3, image 5). Multiple minimally displaced fractures of the left first through fifth ribs. Subcutaneous emphysema about the left chest wall. CT ABDOMEN PELVIS FINDINGS Hepatobiliary: No  solid liver abnormality is seen. No gallstones, gallbladder wall thickening, or biliary dilatation. Pancreas: Unremarkable. No pancreatic ductal dilatation or surrounding inflammatory changes. Spleen: Normal in size without significant abnormality. Adrenals/Urinary Tract: Adrenal glands are unremarkable. Simple, benign right parapelvic renal cysts, for which no further follow-up or characterization is required. Kidneys are otherwise normal, without renal calculi, solid lesion, or hydronephrosis. Bladder is unremarkable. Stomach/Bowel: Stomach is within normal limits. Appendix appears diminutive although otherwise normal. No evidence of bowel wall thickening, distention, or inflammatory changes. Moderate burden of stool and stool balls throughout the colon and rectum. Vascular/Lymphatic: Aortic atherosclerosis. No enlarged abdominal or pelvic lymph nodes. Reproductive: No mass or other abnormality. Other: Small, fat containing bilateral inguinal hernias. No ascites. Musculoskeletal: No acute osseous findings. CT THORACIC AND LUMBAR SPINE FINDINGS Alignment: Normal thoracic kyphosis. Normal lumbar lordosis. Vertebral bodies: Multiple nondisplaced and mildly displaced fractures of the left thoracic transverse processes, involving at least T1 through T9. No fractures of the vertebral bodies proper or other posterior elements. Disc spaces: Status post lumbar discectomy and fusion of L3-L5. Mild disc space height loss and osteophytosis throughout the thoracic spine. Remaining anatomic lumbar disc spaces are generally preserved. Paraspinous soft tissues: Unremarkable. IMPRESSION: 1. Multiple minimally displaced fractures of the left first through fifth ribs as well as multiple nondisplaced and mildly displaced fractures of the left thoracic transverse processes, involving at least T1 through T9. 2. Mildly displaced, comminuted fractures of the distal third of the left clavicle. 3. Small left pneumothorax, no greater than  10% volume. 4. Small simple fluid attenuation of the left pleural effusion. 5. Pneumomediastinum and subcutaneous emphysema about the left chest. 6. Heterogeneous, intermediate attenuation lesion in the anterior mediastinum overlying the ascending aorta and pulmonary outflow tract, measuring 3.5 x 2.7 cm with adjacent fat stranding. This is most consistent with a hematoma, although of uncertain origin, and appears to be separated from the aortic arch by a fat plane. Recommend vascular consultation and short interval follow-up to assess for stability. 7. No CT evidence of acute traumatic injury to the abdomen or pelvis. These results were called by telephone at the time of interpretation on 08/26/2023 at 7:15 pm to Dr. Pricilla Loveless , who verbally acknowledged these results. Aortic Atherosclerosis (ICD10-I70.0). Electronically Signed   By: Jearld Lesch M.D.   On: 08/26/2023 19:22   DG Pelvis Portable  Result Date: 08/26/2023 CLINICAL DATA:  trauma EXAM: PORTABLE  PELVIS 1-2 VIEWS COMPARISON:  CT abdomen pelvis 08/26/2023 FINDINGS: There is no evidence of pelvic fracture or diastasis. No acute displaced fracture or dislocation of either hips on frontal view. Visualized lower lumbar spine surgical hardware. No pelvic bone lesions are seen. IMPRESSION: Negative for acute traumatic injury. Electronically Signed   By: Tish Frederickson M.D.   On: 08/26/2023 19:18   DG Chest Portable 1 View  Result Date: 08/26/2023 CLINICAL DATA:  trauma.  Fall off deck about 20 feet. EXAM: PORTABLE CHEST 1 VIEW COMPARISON:  CT chest 08/26/2023 FINDINGS: The heart and mediastinal contours are within normal limits. Atherosclerotic plaque. No focal consolidation. No pulmonary edema. No pleural effusion. Known left pneumothorax better evaluated on CT chest 08/26/2023. No right pneumothorax. Multiple acute left rib fractures better evaluated on CT chest 08/26/2023. Acute minimally displaced left T1 transverse process fracture. Acute  comminuted and displaced distal left clavicular fracture. IMPRESSION: 1. Known trace to small volume left pneumothorax better evaluated on CT chest 08/26/2023. 2. Multiple acute left rib fractures better evaluated on CT chest 08/26/2023. 3. Acute minimally displaced left T1 transverse process fracture. 4. Acute comminuted and displaced distal left clavicular fracture. 5.  Aortic Atherosclerosis (ICD10-I70.0). These results were called by telephone at the time of interpretation on 08/26/2023 at 7:08 pm to provider Pricilla Loveless , who verbally acknowledged these results. Electronically Signed   By: Tish Frederickson M.D.   On: 08/26/2023 19:16   CT Head Wo Contrast  Result Date: 08/26/2023 CLINICAL DATA:  Head trauma, GCS<=14 (Ped 0-17y); Polytrauma, blunt EXAM: CT HEAD WITHOUT CONTRAST CT CERVICAL SPINE WITHOUT CONTRAST TECHNIQUE: Multidetector CT imaging of the head and cervical spine was performed following the standard protocol without intravenous contrast. Multiplanar CT image reconstructions of the cervical spine were also generated. RADIATION DOSE REDUCTION: This exam was performed according to the departmental dose-optimization program which includes automated exposure control, adjustment of the mA and/or kV according to patient size and/or use of iterative reconstruction technique. COMPARISON:  None Available. FINDINGS: CT HEAD FINDINGS Brain: No evidence of large-territorial acute infarction. No parenchymal hemorrhage. No mass lesion. No extra-axial collection. No mass effect or midline shift. No hydrocephalus. Basilar cisterns are patent. Vascular: No hyperdense vessel. Skull: No acute fracture or focal lesion. Sinuses/Orbits: Paranasal sinuses and mastoid air cells are clear. Bilateral lens replacement. Otherwise the orbits are unremarkable. Other: None. CT CERVICAL SPINE FINDINGS Alignment: Normal. Skull base and vertebrae: Acute displaced left C7 transverse process fracture. No aggressive appearing  focal osseous lesion or focal pathologic process. Soft tissues and spinal canal: No prevertebral fluid or swelling. No visible canal hematoma. Upper chest: Trace apical left pneumothorax. Other: Acute displaced left T1 transverse process fracture. Acute displaced posterior 1-3 left rib fracture. Subcutaneus soft tissue emphysema of the left neck and visualized shoulder/back. Acute displaced and comminuted fractured left distal clavicle. IMPRESSION: 1. No acute intracranial abnormality. 2. Acute displaced left C7 transverse process fracture. 3. Acute displaced left T1 transverse process fracture. 4. Acute displaced posterior 1-3 left rib fracture. 5. Trace apical left pneumothorax. 6. Acute displaced and comminuted fractured left distal clavicle. 7. Please see separately dictated CT chest and CT thoracic spine 08/26/2023. These results were called by telephone at the time of interpretation on 08/26/2023 at 7:08 pm to provider Pricilla Loveless , who verbally acknowledged these results. Electronically Signed   By: Tish Frederickson M.D.   On: 08/26/2023 19:10   CT CERVICAL SPINE WO CONTRAST  Result Date: 08/26/2023 CLINICAL DATA:  Head trauma,  GCS<=14 (Ped 0-17y); Polytrauma, blunt EXAM: CT HEAD WITHOUT CONTRAST CT CERVICAL SPINE WITHOUT CONTRAST TECHNIQUE: Multidetector CT imaging of the head and cervical spine was performed following the standard protocol without intravenous contrast. Multiplanar CT image reconstructions of the cervical spine were also generated. RADIATION DOSE REDUCTION: This exam was performed according to the departmental dose-optimization program which includes automated exposure control, adjustment of the mA and/or kV according to patient size and/or use of iterative reconstruction technique. COMPARISON:  None Available. FINDINGS: CT HEAD FINDINGS Brain: No evidence of large-territorial acute infarction. No parenchymal hemorrhage. No mass lesion. No extra-axial collection. No mass effect or  midline shift. No hydrocephalus. Basilar cisterns are patent. Vascular: No hyperdense vessel. Skull: No acute fracture or focal lesion. Sinuses/Orbits: Paranasal sinuses and mastoid air cells are clear. Bilateral lens replacement. Otherwise the orbits are unremarkable. Other: None. CT CERVICAL SPINE FINDINGS Alignment: Normal. Skull base and vertebrae: Acute displaced left C7 transverse process fracture. No aggressive appearing focal osseous lesion or focal pathologic process. Soft tissues and spinal canal: No prevertebral fluid or swelling. No visible canal hematoma. Upper chest: Trace apical left pneumothorax. Other: Acute displaced left T1 transverse process fracture. Acute displaced posterior 1-3 left rib fracture. Subcutaneus soft tissue emphysema of the left neck and visualized shoulder/back. Acute displaced and comminuted fractured left distal clavicle. IMPRESSION: 1. No acute intracranial abnormality. 2. Acute displaced left C7 transverse process fracture. 3. Acute displaced left T1 transverse process fracture. 4. Acute displaced posterior 1-3 left rib fracture. 5. Trace apical left pneumothorax. 6. Acute displaced and comminuted fractured left distal clavicle. 7. Please see separately dictated CT chest and CT thoracic spine 08/26/2023. These results were called by telephone at the time of interpretation on 08/26/2023 at 7:08 pm to provider Pricilla Loveless , who verbally acknowledged these results. Electronically Signed   By: Tish Frederickson M.D.   On: 08/26/2023 19:10    Anti-infectives: Anti-infectives (From admission, onward)    None       Assessment/Plan: Fall  Mult TP fractures - C collar for comfort   Left rib fxs- PULMONARY TOILET / PAIN CONTROL  Left PTX- stable on CXR - monitor   PT/OP  MS- may be mildly concussed -TBI therapies   Regular diet  Can be OOB   Left clavicle- non op sling for comfort      LOS: 1 day    Clovis Pu Storm Dulski MD 08/27/2023   Moderate  complexity

## 2023-08-27 NOTE — Progress Notes (Signed)
Orthopedic Tech Progress Note Patient Details:  Erik Holmes 03-06-1961 [id number]  Ortho Devices Type of Ortho Device: Shoulder immobilizer Ortho Device/Splint Location: LUE Ortho Device/Splint Interventions: Ordered, Application, Adjustment   Post Interventions Patient Tolerated: Well Instructions Provided: Adjustment of device  Tonye Pearson 08/27/2023, 10:00 AM

## 2023-08-27 NOTE — Discharge Instructions (Signed)
Orthopedic Surgery Discharge Instructions  Patient name: Erik Holmes. Orthopedic diagnosis: left clavicle (collar bone) fracture Orthopedist: Willia Craze, MD  Activity: You should be nonweightbearing in a sling on your left upper extremity.  You are okay to shower and come out of the sling for the shower.  When out of the shower, you should go back into the sling.  Reasons to Call the Office: You should feel free to call the office with any concerns or questions you have about your clavicle fracture, but you should definitely notify the office if you develop: -Pain that is getting worse with each day -new weakness in the left upper extremity -new or worsening numbness or tingling in the left upper extremity -other concerns about your clavicle fracture  Follow Up Appointments: You should call the office to schedule an appointment for approximately 1 week after discharge from the hospital.  If the office staff says that there are no appointments available, please ask them to check with [facility]. Neysa Bonito is Dr. Kathi Der assistant and Dr. Christell Constant has notified her that you need to be seen so we will find an appointment time for you.   Office Information:  -Willia Craze, MD -Phone number: [phone] -Address: [address]       [place], [place] [address]

## 2023-08-27 NOTE — TOC CAGE-AID Note (Signed)
Transition of Care (TOC) - CAGE-AID Screening   Patient Details  Name: Brownie Nehme. MRN: [id number] Date of Birth: 06-29-1961  Transition of Care [facility]) CM/SW Contact:    [name], RN Phone Number: [phone] 08/27/2023, 2:41 PM   Clinical Narrative: Pt here after falling approx 20 feet off his deck, head first.  Pt sustained multiple injuries.  Pt denies alcohol use but states that he does smoke marijuana some.  Pt does not need resources.  Screening complete.    CAGE-AID Screening:    Have You Ever Felt You Ought to Cut Down on Your Drinking or Drug Use?: No Have People Annoyed You By Critizing Your Drinking Or Drug Use?: No Have You Felt Bad Or Guilty About Your Drinking Or Drug Use?: No Have You Ever Had a Drink or Used Drugs First Thing In The Morning to Steady Your Nerves or to Get Rid of a Hangover?: No CAGE-AID Score: 0  Substance Abuse Education Offered: No

## 2023-08-27 NOTE — Evaluation (Signed)
Physical Therapy Evaluation Patient Details Name: Erik Holmes. MRN: [id number] DOB: 15-Jan-1961 Today's Date: 08/27/2023  History of Present Illness  Pt is a 62 yr old male who presented following a fall of their family's deck. Pt can not recall the fall and what they were doing. Pt was found to have a L clavicle fx with NWB, L rib fxs 1-3 , splaced left C7 transverse process fracture, acute displaced left T1 transverse process fracture now with a hard c collar. PMH: anxiety, DDD, DM, HTN, sleep apnea, lubar fusion, RTKA  Clinical Impression   Pt admitted with above diagnosis. Lives at home with wife, in a two-level home with 4 steps to enter, no rails; Prior to admission, pt was able to manage independently; Presents to PT with significant pain limiting activity tolerance and functional mobility; Got up to EOB with light mod assist, stood with mod assist and able to walk in the room with min assist and second person for safety with chair follow; To be able to get home he will either need to be able to safely negotiate a flight of steps, or stay on first floor with 1/2 bath and sleep in recliner; Will need more specific info re: available assist at home;  Pt currently with functional limitations due to the deficits listed below (see PT Problem List). Pt will benefit from skilled PT to increase their independence and safety with mobility to allow discharge to the venue listed below.           If plan is discharge home, recommend the following: A lot of help with walking and/or transfers;Assistance with cooking/housework;Help with stairs or ramp for entrance   Can travel by private vehicle        Equipment Recommendations BSC/3in1;Other (comment) (will consider unilateral devcie like cane/maybe crutch)  Recommendations for Other Services       Functional Status Assessment Patient has had a recent decline in their functional status and demonstrates the ability to make significant  improvements in function in a reasonable and predictable amount of time.     Precautions / Restrictions Precautions Precautions: Cervical;Shoulder;Fall Type of Shoulder Precautions: NWB to LUE, sling for comfort Shoulder Interventions: Shoulder sling/immobilizer Precaution Booklet Issued: No Precaution Comments: hard c collar Required Braces or Orthoses: Cervical Brace Cervical Brace: Hard collar;At all times Restrictions Weight Bearing Restrictions: Yes LUE Weight Bearing: Non weight bearing      Mobility  Bed Mobility Overal bed mobility: Needs Assistance Bed Mobility: Supine to Sit     Supine to sit: Mod assist, HOB elevated, Min assist     General bed mobility comments: Lots of pain, and light mod assist to push up from R sidelying to sit; has a recliner chair on first level of home that can be an option for sleeping    Transfers Overall transfer level: Needs assistance Equipment used: 1 person hand held assist, 2 person hand held assist Transfers: Sit to/from Stand Sit to Stand: Mod assist, +2 safety/equipment, +2 physical assistance           General transfer comment: due to pain/SOB had x2 for first transfers; support at gait belt and R shoulder girdle    Ambulation/Gait Ambulation/Gait assistance: Min assist, +2 safety/equipment Gait Distance (Feet): 18 Feet (around bed to the bathroom, then back to recliner) Assistive device: 1 person hand held assist (chair follow for safety) Gait Pattern/deviations: Step-through pattern, Decreased step length - right, Decreased step length - left Gait velocity: slowed  General Gait Details: Used RUE for support; cues to self-monitor for activity tolerance  Stairs            Wheelchair Mobility     Tilt Bed    Modified Rankin (Stroke Patients Only)       Balance     Sitting balance-Leahy Scale: Good       Standing balance-Leahy Scale: Fair                               Pertinent  Vitals/Pain Pain Assessment Pain Assessment: 0-10 Pain Score: 8  Pain Location: back, neck, LUE Pain Descriptors / Indicators: Discomfort, Grimacing, Guarding Pain Intervention(s): Limited activity within patient's tolerance    Home Living Family/patient expects to be discharged to:: Private residence Living Arrangements: Spouse/significant other Available Help at Discharge: Family Type of Home: House Home Access: Stairs to enter Entrance Stairs-Rails: None Entrance Stairs-Number of Steps: 4 Alternate Level Stairs-Number of Steps: 13 Home Layout: Two level;1/2 bath on main level Home Equipment: None      Prior Function Prior Level of Function : Independent/Modified Independent                     Extremity/Trunk Assessment   Upper Extremity Assessment Upper Extremity Assessment: Defer to OT evaluation LUE Deficits / Details: due to fx limited AROM of shoulder at this time but WFL distally at this time with intact sensation LUE: Unable to fully assess due to pain;Unable to fully assess due to immobilization LUE Sensation: WNL LUE Coordination: decreased gross motor    Lower Extremity Assessment Lower Extremity Assessment: Generalized weakness (back of LLE braced against against bed at initial stand)    Cervical / Trunk Assessment Cervical / Trunk Assessment: Other exceptions (neck collar)  Communication   Communication Communication: No apparent difficulties  Cognition Arousal: Alert Behavior During Therapy: WFL for tasks assessed/performed Overall Cognitive Status: Within Functional Limits for tasks assessed                                 General Comments: Pt reported they have dementia and it is sometimes hard to get started in the AM.        General Comments General comments (skin integrity, edema, etc.): Session conducted on room air; O2 sats ranged from 89% to 94%; taught pt incentive spirometry    Exercises     Assessment/Plan     PT Assessment Patient needs continued PT services  PT Problem List Decreased strength;Decreased range of motion;Decreased activity tolerance;Decreased balance;Decreased mobility;Decreased coordination;Decreased cognition;Decreased knowledge of use of DME;Decreased safety awareness;Decreased knowledge of precautions;Cardiopulmonary status limiting activity;Pain       PT Treatment Interventions DME instruction;Gait training;Stair training;Functional mobility training;Therapeutic exercise;Therapeutic activities;Balance training;Cognitive remediation;Patient/family education;Wheelchair mobility training    PT Goals (Current goals can be found in the Care Plan section)  Acute Rehab PT Goals Patient Stated Goal: get pain meds PT Goal Formulation: With patient Time For Goal Achievement: 09/10/23 Potential to Achieve Goals: Good    Frequency Min 1X/week     Co-evaluation PT/OT/SLP Co-Evaluation/Treatment: Yes Reason for Co-Treatment: Complexity of the patient's impairments (multi-system involvement) PT goals addressed during session: Mobility/safety with mobility OT goals addressed during session: ADL's and self-care       AM-PAC PT "6 Clicks" Mobility  Outcome Measure Help needed turning from your back to your side while in a  flat bed without using bedrails?: A Lot Help needed moving from lying on your back to sitting on the side of a flat bed without using bedrails?: A Little Help needed moving to and from a bed to a chair (including a wheelchair)?: A Lot Help needed standing up from a chair using your arms (e.g., wheelchair or bedside chair)?: A Lot Help needed to walk in hospital room?: A Little Help needed climbing 3-5 steps with a railing? : Total 6 Click Score: 13    End of Session Equipment Utilized During Treatment: Gait belt;Other (comment);Cervical collar (sling) Activity Tolerance: Patient tolerated treatment well Patient left: in chair;with call bell/phone within  reach;with chair alarm set Nurse Communication: Mobility status PT Visit Diagnosis: Unsteadiness on feet (R26.81);Other abnormalities of gait and mobility (R26.89);Pain Pain - Right/Left: Left Pain - part of body: Shoulder (ribs and back)    Time: 1023-1101 PT Time Calculation (min) (ACUTE ONLY): 38 min   Charges:   PT Evaluation $PT Eval Moderate Complexity: 1 Mod   PT General Charges $$ ACUTE PT VISIT: 1 Visit         Van Clines, PT  Acute Rehabilitation Services Office [phone] Secure Chat welcomed   [name] 08/27/2023, 12:45 PM

## 2023-08-27 NOTE — Consult Note (Addendum)
Orthopedic Surgery Consult Note  Assessment: Patient is a 62 y.o. male with closed, left comminuted distal clavicle fracture   Plan: -Will attempt nonoperative treatment -Okay for diet and DVT prophylaxis from orthopedic perspective -Weight bearing status: NWB LUE in sling -PT evaluate and treat -Pain control -Dispo: per primary -Will have him follow-up in 1 week in the office.  Orthopedic discharge instructions included in the discharge tab  ___________________________________________________________________________   Reason for consult: left clavicle fracture  History:  Patient is a 62 y.o. male who fell off of a porch on 08/26/2023. The railing gave out. He was brought to [facility] ED. Trauma surgery was consulted for fall from height.  He got a workup in the emergency department.  He was admitted to the trauma surgery service last night for rib fractures with a pneumothorax and a mediastinal hematoma.  He has multiple transverse process fractures as well for which neurosurgery was consulted.  Orthopedics was consulted for a left clavicle fracture.  This morning, he is reporting pain in his left shoulder.  He is not having any other extremity pain.   Physical Exam:  General: no acute distress, appears stated age Neurologic: alert, answering questions appropriately, following commands Cardiovascular: regular rate, no cyanosis Respiratory: unlabored breathing on room air, symmetric chest rise Psychiatric: appropriate affect, normal cadence to speech  MSK:   -Left upper extremity  No tenderness to palpation over extremity, except over the clavicle. No gross deformity. No open wounds Fires deltoid, biceps, triceps, wrist extensors, wrist flexors, finger extensors, finger flexors  AIN/PIN/IO intact  Palpable radial pulse  Sensation intact to light touch in median/ulnar/radial/axillary nerve distributions  Hand warm and well perfused  Imaging: XR of the left clavicle from  08/26/2023 was independently reviewed and interpreted, showing a comminuted distal clavicle fracture.  The fracture appears in good alignment.  No dislocation seen.     Patient name: Erik Holmes. Patient MRN: [id number] Date: 08/27/23

## 2023-08-27 NOTE — Evaluation (Signed)
Speech Language Pathology Evaluation Patient Details Name: Erik Holmes. MRN: [id number] DOB: 1961/01/16 Today's Date: 08/27/2023 Time: 1345-1400 SLP Time Calculation (min) (ACUTE ONLY): 15 min  Problem List:  Patient Active Problem List   Diagnosis Date Noted   Displaced fracture of lateral end of left clavicle, initial encounter for closed fracture 08/27/2023   Traumatic pneumothorax, initial encounter 08/26/2023   Primary osteoarthritis of one knee, right 02/03/2017   S/P knee replacement 02/03/2017   Memory loss 07/29/2014   History of multiple concussions 07/29/2014   Depression 07/29/2014   Anxiety state 07/29/2014   Internal hemorrhoids with prolapse, bleeding 04/24/2014   History of colonic polyps 04/24/2014   Anal skin tags 04/24/2014   ED (erectile dysfunction) 04/24/2014   Dermatophytosis of groin and perianal area 04/24/2014   Anal itch 04/24/2014   Abnormal urinary stream/discharge 04/24/2014   Degenerative disc disease, lumbar    Past Medical History:  Past Medical History:  Diagnosis Date   Allergy    Anxiety    Cataract    Degenerative disc disease, lumbar    Depression    Diabetes mellitus without complication (HCC)    Diabetes mellitus, type II (HCC)    ED (erectile dysfunction)    GERD (gastroesophageal reflux disease)    Gout    Hypertension    Internal hemorrhoids with prolapse, bleeding 04/24/2014   RLS (restless legs syndrome)    Seizures (HCC)    last seizure around 2016 per patient.   Shortness of breath    Sleep apnea    cpap   Tubular adenoma of colon    multiple, TV adenomas also   Past Surgical History:  Past Surgical History:  Procedure Laterality Date   ANTERIOR LAT LUMBAR FUSION Left 04/30/2013   Procedure: ANTERIOR LATERAL LUMBAR FUSION 1 LEVEL;  Surgeon: Maeola Harman, MD;  Location: [facility];  Service: Neurosurgery;  Laterality: Left;  Lumbar three-four  Anterolateral decompression/fusion/percutaneous pedicle screws     BACK SURGERY     CARDIAC CATHETERIZATION  1998   90's  neg   cataract surgery Bilateral    COLONOSCOPY  multiple   Dr. Karma Lew   EYE SURGERY     cataracts bilateral with lens placement   hemorrhoid banding  2015   LUMBAR PERCUTANEOUS PEDICLE SCREW 1 LEVEL N/A 04/30/2013   Procedure: LUMBAR PERCUTANEOUS PEDICLE SCREW 1 LEVEL;  Surgeon: Maeola Harman, MD;  Location: [facility];  Service: Neurosurgery;  Laterality: N/A;  Lumbar three-four  Anterolateral decompression/fusion/percutaneous pedicle screws   SPINE SURGERY  2008   lumbar fusion   TOTAL KNEE ARTHROPLASTY Right 02/03/2017   Procedure: RIGHT TOTAL KNEE ARTHROPLASTY;  Surgeon: Eugenia Mcalpine, MD;  Location: [facility];  Service: Orthopedics;  Laterality: Right;   HPI:  Patient is a 62 y.o. male with PMH: anxiety, DDD, DM, HTN, sleep apnea, lubar fusion, RTKA. He was undergoing evaluation at [facility] in 2015 with differential diagnoses of pseudo-dementia of depression, metabolic, post-traumatic, neurodegenerative, mild cognitive impairment. Of note, patient recently had f/u appointment with Dr. Ernestene Kiel at [facility] during which patient reported dysphagia symptoms including increased frequency of choking on food. Modified Barium Swallow study was ordered to be completed at [facility] on an outpatient basis. Patient presented to the ED on 08/26/23 s/p fall from family's deck (20 feet), resulting in multiple injuries including transverse process fractures of the thoracic spine and a C7 transverse process fracture. CT head negative for acute intracranial abnormality.   Assessment / Plan /  Recommendation Clinical Impression  Patient is presenting with cognitive impairments as per this evaluation, but difficult to determine how far off his baseline he is as no family present. Patient participated in assessment via the SLUMS examination, for which he received a score of 9 of 30, placing him in the scoring category of "Dementia". Neurologist reported  in his evaluation in 2015 that patient scored a 27 out of 30 on MMSE (no impairment) and an 18/30 on the MOCA (mild cognitive impairment). Patient was oriented to time, place, situation but unable to perform mildly complex computations, unable to demonstrate delayed recall with five words or with short story. Patient did tell SLP that he was still driving and although he denied getting lost he did say, "I get confused". He acknowledged instances of mood swings when SLP inquired. He reported that his spouse manages his medications as he is not able to perform that task. 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 Of note, patient recently was seen for f/u with ENT at [facility] and c/o dysphagia. He has active orders for [facility] and so SLP will discuss this with MD. SLP recommending continued intervention while admitted to determine if he is at baseline cognition.    SLP Assessment  SLP Recommendation/Assessment: Patient needs continued Speech Lanaguage Pathology Services SLP Visit Diagnosis: Cognitive communication deficit (R41.841)    Recommendations for follow up therapy are one component of a multi-disciplinary discharge planning process, led by the attending physician.  Recommendations may be updated based on patient status, additional functional criteria and insurance authorization.    Follow Up Recommendations  Other (comment) (TBD)    Assistance Recommended at Discharge  Frequent or constant Supervision/Assistance  Functional Status Assessment Patient has had a recent decline in their functional status and demonstrates the ability to make significant improvements in function in a reasonable and predictable amount of time.  Frequency and Duration min 1 x/week  1 week      SLP Evaluation Cognition  Overall Cognitive Status: No family/caregiver present to determine baseline cognitive  functioning Arousal/Alertness: Awake/alert Orientation Level: Oriented to person;Oriented to place;Oriented to time Memory: Impaired Memory Impairment: Storage deficit Problem Solving: Impaired Problem Solving Impairment: Verbal complex       Comprehension  Auditory Comprehension Overall Auditory Comprehension: Appears within functional limits for tasks assessed    Expression Expression Primary Mode of Expression: Verbal Verbal Expression Overall Verbal Expression: Appears within functional limits for tasks assessed   Oral / Motor  Oral Motor/Sensory Function Overall Oral Motor/Sensory Function: Within functional limits Motor Speech Overall Motor Speech: Appears within functional limits for tasks assessed Respiration: Within functional limits Resonance: Within functional limits Articulation: Within functional limitis Intelligibility: Intelligible Motor Planning: Witnin functional limits           Angela Nevin, MA, CCC-SLP Speech Therapy

## 2023-08-27 NOTE — Evaluation (Signed)
Occupational Therapy Evaluation Patient Details Name: Erik Holmes. MRN: [id number] DOB: [birth year] Today's Date: 08/27/2023    History of Present Illness Pt is a 62 yr old male who presented following a fall of their family's deck. Pt can not recall the fall and what they were doing. Pt was found to have a L clavicle fx with NWB, L rib fxs 1-3 , splaced left C7 transverse process fracture, acute displaced left T1 transverse process fracture now with a hard c collar. PMH: anxiety, DDD, DM, HTN, sleep apnea, lubar fusion, RTKA   Clinical Impression   Pt presented in bed at this time and was premedicated with nursing. Pt reported 8/10 throughout the session in LUE, back and neck. Pt was educated about all precautions in session but pt did mention they have dementia but when examining chart no PMH reports. Pt was able to follow commands throughout the session. Pt was able to complete transfers with mod x1-2 due to pain and line management. Patient will benefit from continued inpatient follow up therapy, <3 hours/day.       If plan is discharge home, recommend the following: A little help with walking and/or transfers;A little help with bathing/dressing/bathroom;Assistance with cooking/housework;Assist for transportation;Help with stairs or ramp for entrance    Functional Status Assessment  Patient has had a recent decline in their functional status and demonstrates the ability to make significant improvements in function in a reasonable and predictable amount of time.  Equipment Recommendations   (TBD but of they go home BSC, shower seat)    Recommendations for Other Services       Precautions / Restrictions Precautions Precautions: Cervical;Shoulder;Fall Type of Shoulder Precautions: NWB to LUE, sling for comfort Shoulder Interventions: Shoulder sling/immobilizer Precaution Booklet Issued: No Precaution Comments: hard c collar Required Braces or Orthoses: Cervical  Brace Cervical Brace: Hard collar;At all times Restrictions Weight Bearing Restrictions: Yes LUE Weight Bearing: Non weight bearing      Mobility Bed Mobility Overal bed mobility: Needs Assistance Bed Mobility: Supine to Sit     Supine to sit: Mod assist, HOB elevated, Min assist     General bed mobility comments: due to positioning to decrease in pain with movement but as pain levels decrease should be able to increase in ability to complete. Pt reported they could sleep in chair at home    Transfers Overall transfer level: Needs assistance Equipment used: 1 person hand held assist, 2 person hand held assist Transfers: Sit to/from Stand Sit to Stand: Mod assist, +2 safety/equipment, +2 physical assistance           General transfer comment: due to pain/SOB had x2 for first transfers      Balance Overall balance assessment: Needs assistance Sitting-balance support: Feet supported, Single extremity supported Sitting balance-Leahy Scale: Good     Standing balance support: Bilateral upper extremity supported Standing balance-Leahy Scale: Fair                             ADL either performed or assessed with clinical judgement   ADL Overall ADL's : Needs assistance/impaired Eating/Feeding: Minimal assistance;Sitting   Grooming: Wash/dry hands;Wash/dry face;Minimal assistance;Moderate assistance   Upper Body Bathing: Moderate assistance;Sitting   Lower Body Bathing: Maximal assistance;Cueing for safety;Cueing for sequencing;Sit to/from stand   Upper Body Dressing : Minimal assistance;Moderate assistance;Sitting   Lower Body Dressing: Maximal assistance;Sitting/lateral leans;Sit to/from stand   Toilet Transfer: Moderate assistance;+2 for physical assistance;+2 for  safety/equipment Toilet Transfer Details (indicate cue type and reason): due to lines/pain Toileting- Clothing Manipulation and Hygiene: Maximal assistance;Total assistance;Sit to/from  stand       Functional mobility during ADLs: Minimal assistance       Vision Baseline Vision/History: 0 No visual deficits Vision Assessment?: No apparent visual deficits     Perception         Praxis         Pertinent Vitals/Pain Pain Assessment Pain Assessment: 0-10 Pain Score: 8  Pain Location: back, neck, LUE Pain Descriptors / Indicators: Discomfort, Grimacing, Guarding Pain Intervention(s): Limited activity within patient's tolerance, Monitored during session, Premedicated before session, Repositioned     Extremity/Trunk Assessment Upper Extremity Assessment Upper Extremity Assessment: Right hand dominant;LUE deficits/detail LUE Deficits / Details: due to fx limited AROM of shoulder at this time but WFL distally at this time with intact sensation LUE: Unable to fully assess due to pain;Unable to fully assess due to immobilization LUE Sensation: WNL LUE Coordination: decreased gross motor   Lower Extremity Assessment Lower Extremity Assessment: Defer to PT evaluation   Cervical / Trunk Assessment Cervical / Trunk Assessment: Other exceptions (neck collar)   Communication Communication Communication: No apparent difficulties   Cognition Arousal: Alert Behavior During Therapy: WFL for tasks assessed/performed Overall Cognitive Status: Within Functional Limits for tasks assessed                                 General Comments: Pt reported they have dementia and it is sometimes hard to get started in the AM.     General Comments       Exercises     Shoulder Instructions      Home Living Family/patient expects to be discharged to:: Private residence Living Arrangements: Spouse/significant other Available Help at Discharge: Family Type of Home: House Home Access: Stairs to enter Secretary/administrator of Steps: 4 Entrance Stairs-Rails: None Home Layout: Two level;1/2 bath on main level Alternate Level Stairs-Number of Steps:  13 Alternate Level Stairs-Rails: Right Bathroom Shower/Tub: Walk-in shower;Curtain         Home Equipment: None          Prior Functioning/Environment Prior Level of Function : Independent/Modified Independent                        OT Problem List: Decreased strength;Decreased range of motion;Decreased activity tolerance;Impaired balance (sitting and/or standing);Decreased safety awareness;Pain;Impaired UE functional use      OT Treatment/Interventions: Self-care/ADL training;Therapeutic exercise;DME and/or AE instruction;Therapeutic activities;Patient/family education;Balance training    OT Goals(Current goals can be found in the care plan section) Acute Rehab OT Goals Patient Stated Goal: to get better OT Goal Formulation: With patient Time For Goal Achievement: 09/10/23 Potential to Achieve Goals: Good  OT Frequency: Min 2X/week    Co-evaluation PT/OT/SLP Co-Evaluation/Treatment: Yes Reason for Co-Treatment: Complexity of the patient's impairments (multi-system involvement)   OT goals addressed during session: ADL's and self-care      AM-PAC OT "6 Clicks" Daily Activity     Outcome Measure Help from another person eating meals?: A Little Help from another person taking care of personal grooming?: A Little Help from another person toileting, which includes using toliet, bedpan, or urinal?: A Lot Help from another person bathing (including washing, rinsing, drying)?: A Lot Help from another person to put on and taking off regular upper body clothing?: A Little Help from another  person to put on and taking off regular lower body clothing?: A Lot 6 Click Score: 15   End of Session Equipment Utilized During Treatment: Gait belt Nurse Communication: Mobility status  Activity Tolerance: Patient limited by pain Patient left: in chair;with call bell/phone within reach;with chair alarm set  OT Visit Diagnosis: Unsteadiness on feet (R26.81);Repeated falls  (R29.6);Muscle weakness (generalized) (M62.81);History of falling (Z91.81);Pain Pain - Right/Left: Left Pain - part of body: Shoulder                Time: 1023-1101 OT Time Calculation (min): 38 min Charges:  OT General Charges $OT Visit: 1 Visit OT Evaluation $OT Eval Low Complexity: 1 Low OT Treatments $Self Care/Home Management : 8-22 mins  Presley Raddle OTR/L  Acute Rehab Services  [phone] office number   Alphia Moh 08/27/2023, 11:38 AM

## 2023-08-28 ENCOUNTER — Inpatient Hospital Stay (HOSPITAL_COMMUNITY): Payer: Medicare Other

## 2023-08-28 LAB — CBC
HCT: 48.6 % (ref 39.0–52.0)
Hemoglobin: 16.5 g/dL (ref 13.0–17.0)
MCH: 31.1 pg (ref 26.0–34.0)
MCHC: 34 g/dL (ref 30.0–36.0)
MCV: 91.5 fL (ref 80.0–100.0)
Platelets: 259 10*3/uL (ref 150–400)
RBC: 5.31 MIL/uL (ref 4.22–5.81)
RDW: 12.9 % (ref 11.5–15.5)
WBC: 12.9 10*3/uL — ABNORMAL HIGH (ref 4.0–10.5)
nRBC: 0 % (ref 0.0–0.2)

## 2023-08-28 LAB — BASIC METABOLIC PANEL
Anion gap: 12 (ref 5–15)
BUN: 15 mg/dL (ref 8–23)
CO2: 22 mmol/L (ref 22–32)
Calcium: 8.8 mg/dL — ABNORMAL LOW (ref 8.9–10.3)
Chloride: 100 mmol/L (ref 98–111)
Creatinine, Ser: 1.03 mg/dL (ref 0.61–1.24)
GFR, Estimated: >60 mL/min (ref >60)
Glucose, Bld: 309 mg/dL — ABNORMAL HIGH (ref 70–99)
Potassium: 4.1 mmol/L (ref 3.5–5.1)
Sodium: 134 mmol/L — ABNORMAL LOW (ref 135–145)

## 2023-08-28 LAB — HEMOGLOBIN A1C
Hgb A1c MFr Bld: 10.4 % — ABNORMAL HIGH (ref 4.8–5.6)
Mean Plasma Glucose: 251.78 mg/dL

## 2023-08-28 LAB — GLUCOSE, CAPILLARY
Glucose-Capillary: 303 mg/dL — ABNORMAL HIGH (ref 70–99)
Glucose-Capillary: 315 mg/dL — ABNORMAL HIGH (ref 70–99)
Glucose-Capillary: 294 mg/dL — ABNORMAL HIGH (ref 70–99)
Glucose-Capillary: 175 mg/dL — ABNORMAL HIGH (ref 70–99)

## 2023-08-28 MED ORDER — BUSPIRONE HCL 5 MG PO TABS
15.0000 mg | ORAL_TABLET | Freq: Two times a day (BID) | ORAL | Status: DC
  Administered 2023-08-28 – 2023-08-29 (×3): 15 mg via ORAL
  Filled 2023-08-28 (×3): qty 3

## 2023-08-28 MED ORDER — LITHIUM CARBONATE 300 MG PO CAPS
600.0000 mg | ORAL_CAPSULE | Freq: Every day | ORAL | Status: DC
  Administered 2023-08-28: 600 mg via ORAL
  Filled 2023-08-28 (×2): qty 2

## 2023-08-28 MED ORDER — METHOCARBAMOL 1000 MG/10ML IJ SOLN: 500.0000 mg | Freq: Four times a day (QID) | INTRAVENOUS | Status: DC

## 2023-08-28 MED ORDER — INSULIN ASPART 100 UNIT/ML IJ SOLN: 0.0000 [IU] | Freq: Every day | INTRAMUSCULAR | Status: DC

## 2023-08-28 MED ORDER — POLYETHYLENE GLYCOL 3350 17 G PO PACK
17.0000 g | PACK | Freq: Every day | ORAL | Status: DC
  Administered 2023-08-28 – 2023-08-29 (×2): 17 g via ORAL
  Filled 2023-08-28 (×2): qty 1

## 2023-08-28 MED ORDER — GUAIFENESIN ER 600 MG PO TB12
600.0000 mg | ORAL_TABLET | Freq: Two times a day (BID) | ORAL | Status: DC
  Administered 2023-08-28 – 2023-08-29 (×3): 600 mg via ORAL
  Filled 2023-08-28 (×3): qty 1

## 2023-08-28 MED ORDER — METHOCARBAMOL 750 MG PO TABS
750.0000 mg | ORAL_TABLET | Freq: Four times a day (QID) | ORAL | Status: DC
  Administered 2023-08-28 – 2023-08-29 (×5): 750 mg via ORAL
  Filled 2023-08-28 (×5): qty 1

## 2023-08-28 MED ORDER — SILVER SULFADIAZINE 1 % EX CREA
TOPICAL_CREAM | Freq: Every day | CUTANEOUS | Status: DC
  Administered 2023-08-29: 1 via TOPICAL
  Filled 2023-08-28: qty 85

## 2023-08-28 MED ORDER — INSULIN ASPART 100 UNIT/ML IJ SOLN
0.0000 [IU] | Freq: Three times a day (TID) | INTRAMUSCULAR | Status: DC
  Administered 2023-08-28: 8 [IU] via SUBCUTANEOUS
  Administered 2023-08-28 (×2): 11 [IU] via SUBCUTANEOUS
  Administered 2023-08-29: 5 [IU] via SUBCUTANEOUS

## 2023-08-28 MED ORDER — LITHIUM CARBONATE 300 MG PO CAPS: 300.0000 mg | ORAL_CAPSULE | Freq: Two times a day (BID) | ORAL | Status: DC

## 2023-08-28 MED ORDER — VENLAFAXINE HCL ER 75 MG PO CP24
300.0000 mg | ORAL_CAPSULE | Freq: Every day | ORAL | Status: DC
  Administered 2023-08-28 – 2023-08-29 (×2): 300 mg via ORAL
  Filled 2023-08-28 (×2): qty 4

## 2023-08-28 MED ORDER — LITHIUM CARBONATE 300 MG PO CAPS
300.0000 mg | ORAL_CAPSULE | Freq: Every day | ORAL | Status: DC
  Administered 2023-08-28 – 2023-08-29 (×2): 300 mg via ORAL
  Filled 2023-08-28 (×2): qty 1

## 2023-08-28 MED ORDER — DIAZEPAM 5 MG PO TABS: 5.0000 mg | ORAL_TABLET | Freq: Two times a day (BID) | ORAL | Status: DC | PRN

## 2023-08-28 NOTE — Plan of Care (Signed)

## 2023-08-28 NOTE — Progress Notes (Signed)
   08/28/23 2046  BiPAP/CPAP/SIPAP  $ Non-Invasive Ventilator  Non-Invasive Vent Initial;Non-Invasive Vent Set Up  $ Face Mask Medium Yes  BiPAP/CPAP/SIPAP Pt Type Adult  BiPAP/CPAP/SIPAP Resmed  Mask Type Full face mask  Mask Size Medium  PEEP 12 cmH20  FiO2 (%) 21 %  Patient Home Equipment No  Auto Titrate No  CPAP/SIPAP surface wiped down Yes

## 2023-08-28 NOTE — Progress Notes (Cosign Needed Addendum)
Patient ID: Erik Gammon., male   DOB: 1961-05-07, 62 y.o.   MRN: [id number] [facility] Surgery Progress Note     Subjective: CC-  States that he is sore everywhere, mostly on the left side. Denies SOB but reports having some phlegm that's difficult to cough up. Pain is worse with coughing and moving. Tolerating diet. Denies abdominal pain, n/v. Passing flatus, no BM. Denies noting any new injuries.  Lives at home with his wife who typically works a few hours a day, otherwise would be home with him. He also states that he has a guy who could come help with supervision as needed. Patient is currently retired, disabled.   Objective: Vital signs in last 24 hours: Temp:  [98 F (36.7 C)-98.6 F (37 C)] 98.6 F (37 C) (10/07 0300) Pulse Rate:  [68-77] 69 (10/07 0300) Resp:  [17-20] 20 (10/07 0300) BP: (121-154)/(77-105) 148/80 (10/07 0300) SpO2:  [87 %-93 %] 93 % (10/07 0300) Last BM Date : 08/25/23  Intake/Output from previous day: 10/06 0701 - 10/07 0700 In: -  Out: 1300 [Urine:1300] Intake/Output this shift: No intake/output data recorded.  PE: Gen:  Alert, NAD, pleasant HEENT: c-collar in place. EOM's intact, pupils equal and round Card:  RRR, 2+ radial and DP pulses Pulm:  rhonchi bilaterally, rate and effort normal on 2L [place]. Pulling 1000 on IS Abd: Soft, NT/ND, +BS LUE: sling to LUE, NVI Ext:  calves soft and nontender without edema Psych: A&Ox3 Skin: no rashes noted, warm and dry  Lab Results:  Recent Labs    08/26/23 1740 08/26/23 1756 08/27/23 0959  WBC 21.5*  --  10.8*  HGB 16.9 17.7* 14.9  HCT 50.2 52.0 43.9  PLT 350  --  263   BMET Recent Labs    08/26/23 1740 08/26/23 1756 08/27/23 0959  NA 132* 134* 135  K 3.7 3.9 4.0  CL 97* 101 101  CO2 25  --  25  GLUCOSE 426* 425* 242*  BUN 15 19 12   CREATININE 1.28* 1.00 1.04  CALCIUM 9.2  --  8.5*   PT/INR Recent Labs    08/26/23 1740  LABPROT 13.4  INR 1.0   CMP     Component  Value Date/Time   NA 135 08/27/2023 0959   K 4.0 08/27/2023 0959   CL 101 08/27/2023 0959   CO2 25 08/27/2023 0959   GLUCOSE 242 (H) 08/27/2023 0959   BUN 12 08/27/2023 0959   CREATININE 1.04 08/27/2023 0959   CALCIUM 8.5 (L) 08/27/2023 0959   PROT 7.3 08/26/2023 1740   ALBUMIN 4.5 08/26/2023 1740   AST 42 (H) 08/26/2023 1740   ALT 30 08/26/2023 1740   ALKPHOS 42 08/26/2023 1740   BILITOT 0.6 08/26/2023 1740   GFRNONAA >60 08/27/2023 0959   GFRAA >60 02/05/2017 0355   Lipase     Component Value Date/Time   LIPASE 12 01/26/2012 0915       Studies/Results: DG Chest Port 1 View  Result Date: 08/28/2023 CLINICAL DATA:  62 year old male with history of respiratory failure. EXAM: PORTABLE CHEST 1 VIEW COMPARISON:  Chest x-ray 08/27/2023. FINDINGS: Multiple left-sided rib fractures again noted, better demonstrated on prior CT examination. Lung volumes are low. Trace residual left-sided pneumothorax appears decreased compared to the recent prior study. No right pneumothorax. Elevation of the right hemidiaphragm. Bibasilar opacities (right greater than left) likely atelectatic. No definite acute consolidative airspace disease. No pleural effusions. Unusual linear density adjacent to the upper left mediastinum corresponding  to some pleural fat outlined by pneumomediastinum on recent chest CT. Heart size is normal. Extensive subcutaneous emphysema in the left chest wall and axillary region. Highly comminuted fracture of the distal left clavicle again noted. IMPRESSION: 1. Trace residual left pneumothorax appears decreased compared to the prior study. 2. Elevated right hemidiaphragm slightly increased with increasing basilar subsegmental atelectasis. 3. Persistent pneumomediastinum. 4. Multiple left-sided rib fractures and distal left comminuted clavicular fracture better demonstrated on prior chest CT. Electronically Signed   By: Trudie Reed M.D.   On: 08/28/2023 05:24   DG Chest Port 1  View  Result Date: 08/27/2023 CLINICAL DATA:  Respiratory failure. Left-sided pneumothorax. Fall yesterday. EXAM: PORTABLE CHEST 1 VIEW COMPARISON:  One-view chest x-ray 08/25/2023 FINDINGS: The heart size is normal. Lung volumes are low. Dependent atelectasis is present bilaterally. Tiny left apical pneumothorax is again noted. No significant airspace consolidation is present. IMPRESSION: 1. Stable tiny left apical pneumothorax. 2. Low lung volumes and bibasilar atelectasis. Electronically Signed   By: Marin Roberts M.D.   On: 08/27/2023 12:54   DG Clavicle Left  Result Date: 08/26/2023 CLINICAL DATA:  Fall, left clavicle pain EXAM: LEFT CLAVICLE - 2+ VIEWS COMPARISON:  Same day CT chest abdomen pelvis FINDINGS: Comminuted, mildly displaced fractures of the distal third of the left clavicle, as previously reported by CT. The acromioclavicular and coracoclavicular intervals appear preserved. No fracture or dislocation of the scapula or proximal left humerus. Subcutaneous emphysema about the left chest; previously reported left rib fractures better appreciated by CT. IMPRESSION: 1. Comminuted, mildly displaced fractures of the distal third of the left clavicle, as previously reported by CT. The acromioclavicular and coracoclavicular intervals appear preserved. 2. No fracture or dislocation of the scapula or proximal left humerus. 3. Subcutaneous emphysema about the left chest; previously reported left rib fractures better appreciated by CT. Electronically Signed   By: Jearld Lesch M.D.   On: 08/26/2023 21:03   CT CHEST ABDOMEN PELVIS W CONTRAST  Result Date: 08/26/2023 CLINICAL DATA:  Trauma, fall from deck 20 feet fall, abrasions to left flank EXAM: CT CHEST, ABDOMEN, AND PELVIS WITH CONTRAST CT THORACIC AND LUMBAR SPINE WITH CONTRAST TECHNIQUE: Multidetector CT imaging of the chest, abdomen and pelvis was performed following the standard protocol during bolus administration of intravenous  contrast. Multidetector CT imaging of the thoracic and lumbar spine was performed following the standard protocol during bolus administration of intravenous contrast. RADIATION DOSE REDUCTION: This exam was performed according to the departmental dose-optimization program which includes automated exposure control, adjustment of the mA and/or kV according to patient size and/or use of iterative reconstruction technique. CONTRAST:  75mL OMNIPAQUE IOHEXOL 350 MG/ML SOLN COMPARISON:  None Available. FINDINGS: CT CHEST FINDINGS Cardiovascular: Aortic atherosclerosis. Heterogeneous, intermediate attenuation lesion in the anterior mediastinum overlying the ascending aorta and pulmonary outflow tract, measuring 3.5 x 2.7 cm with adjacent fat stranding (series 3, image 33). Normal heart size. No pericardial effusion. Mediastinum/Nodes: Pneumomediastinum in the anterior and superior mediastinum (series 4, image 62). No enlarged mediastinal, hilar, or axillary lymph nodes. Thyroid gland, trachea, and esophagus demonstrate no significant findings. Lungs/Pleura: Small left pneumothorax, no greater than 10% volume. Small left pleural effusion. Musculoskeletal: No chest wall mass or suspicious osseous lesions identified. Mildly displaced, comminuted fractures of the distal third of the left clavicle (series 3, image 5). Multiple minimally displaced fractures of the left first through fifth ribs. Subcutaneous emphysema about the left chest wall. CT ABDOMEN PELVIS FINDINGS Hepatobiliary: No solid liver abnormality is  seen. No gallstones, gallbladder wall thickening, or biliary dilatation. Pancreas: Unremarkable. No pancreatic ductal dilatation or surrounding inflammatory changes. Spleen: Normal in size without significant abnormality. Adrenals/Urinary Tract: Adrenal glands are unremarkable. Simple, benign right parapelvic renal cysts, for which no further follow-up or characterization is required. Kidneys are otherwise normal,  without renal calculi, solid lesion, or hydronephrosis. Bladder is unremarkable. Stomach/Bowel: Stomach is within normal limits. Appendix appears diminutive although otherwise normal. No evidence of bowel wall thickening, distention, or inflammatory changes. Moderate burden of stool and stool balls throughout the colon and rectum. Vascular/Lymphatic: Aortic atherosclerosis. No enlarged abdominal or pelvic lymph nodes. Reproductive: No mass or other abnormality. Other: Small, fat containing bilateral inguinal hernias. No ascites. Musculoskeletal: No acute osseous findings. CT THORACIC AND LUMBAR SPINE FINDINGS Alignment: Normal thoracic kyphosis. Normal lumbar lordosis. Vertebral bodies: Multiple nondisplaced and mildly displaced fractures of the left thoracic transverse processes, involving at least T1 through T9. No fractures of the vertebral bodies proper or other posterior elements. Disc spaces: Status post lumbar discectomy and fusion of L3-L5. Mild disc space height loss and osteophytosis throughout the thoracic spine. Remaining anatomic lumbar disc spaces are generally preserved. Paraspinous soft tissues: Unremarkable. IMPRESSION: 1. Multiple minimally displaced fractures of the left first through fifth ribs as well as multiple nondisplaced and mildly displaced fractures of the left thoracic transverse processes, involving at least T1 through T9. 2. Mildly displaced, comminuted fractures of the distal third of the left clavicle. 3. Small left pneumothorax, no greater than 10% volume. 4. Small simple fluid attenuation of the left pleural effusion. 5. Pneumomediastinum and subcutaneous emphysema about the left chest. 6. Heterogeneous, intermediate attenuation lesion in the anterior mediastinum overlying the ascending aorta and pulmonary outflow tract, measuring 3.5 x 2.7 cm with adjacent fat stranding. This is most consistent with a hematoma, although of uncertain origin, and appears to be separated from the  aortic arch by a fat plane. Recommend vascular consultation and short interval follow-up to assess for stability. 7. No CT evidence of acute traumatic injury to the abdomen or pelvis. These results were called by telephone at the time of interpretation on 08/26/2023 at 7:15 pm to Dr. Pricilla Loveless , who verbally acknowledged these results. Aortic Atherosclerosis (ICD10-I70.0). Electronically Signed   By: Jearld Lesch M.D.   On: 08/26/2023 19:22   CT T-SPINE NO CHARGE  Result Date: 08/26/2023 CLINICAL DATA:  Trauma, fall from deck 20 feet fall, abrasions to left flank EXAM: CT CHEST, ABDOMEN, AND PELVIS WITH CONTRAST CT THORACIC AND LUMBAR SPINE WITH CONTRAST TECHNIQUE: Multidetector CT imaging of the chest, abdomen and pelvis was performed following the standard protocol during bolus administration of intravenous contrast. Multidetector CT imaging of the thoracic and lumbar spine was performed following the standard protocol during bolus administration of intravenous contrast. RADIATION DOSE REDUCTION: This exam was performed according to the departmental dose-optimization program which includes automated exposure control, adjustment of the mA and/or kV according to patient size and/or use of iterative reconstruction technique. CONTRAST:  75mL OMNIPAQUE IOHEXOL 350 MG/ML SOLN COMPARISON:  None Available. FINDINGS: CT CHEST FINDINGS Cardiovascular: Aortic atherosclerosis. Heterogeneous, intermediate attenuation lesion in the anterior mediastinum overlying the ascending aorta and pulmonary outflow tract, measuring 3.5 x 2.7 cm with adjacent fat stranding (series 3, image 33). Normal heart size. No pericardial effusion. Mediastinum/Nodes: Pneumomediastinum in the anterior and superior mediastinum (series 4, image 62). No enlarged mediastinal, hilar, or axillary lymph nodes. Thyroid gland, trachea, and esophagus demonstrate no significant findings. Lungs/Pleura: Small left pneumothorax, no  greater than 10% volume.  Small left pleural effusion. Musculoskeletal: No chest wall mass or suspicious osseous lesions identified. Mildly displaced, comminuted fractures of the distal third of the left clavicle (series 3, image 5). Multiple minimally displaced fractures of the left first through fifth ribs. Subcutaneous emphysema about the left chest wall. CT ABDOMEN PELVIS FINDINGS Hepatobiliary: No solid liver abnormality is seen. No gallstones, gallbladder wall thickening, or biliary dilatation. Pancreas: Unremarkable. No pancreatic ductal dilatation or surrounding inflammatory changes. Spleen: Normal in size without significant abnormality. Adrenals/Urinary Tract: Adrenal glands are unremarkable. Simple, benign right parapelvic renal cysts, for which no further follow-up or characterization is required. Kidneys are otherwise normal, without renal calculi, solid lesion, or hydronephrosis. Bladder is unremarkable. Stomach/Bowel: Stomach is within normal limits. Appendix appears diminutive although otherwise normal. No evidence of bowel wall thickening, distention, or inflammatory changes. Moderate burden of stool and stool balls throughout the colon and rectum. Vascular/Lymphatic: Aortic atherosclerosis. No enlarged abdominal or pelvic lymph nodes. Reproductive: No mass or other abnormality. Other: Small, fat containing bilateral inguinal hernias. No ascites. Musculoskeletal: No acute osseous findings. CT THORACIC AND LUMBAR SPINE FINDINGS Alignment: Normal thoracic kyphosis. Normal lumbar lordosis. Vertebral bodies: Multiple nondisplaced and mildly displaced fractures of the left thoracic transverse processes, involving at least T1 through T9. No fractures of the vertebral bodies proper or other posterior elements. Disc spaces: Status post lumbar discectomy and fusion of L3-L5. Mild disc space height loss and osteophytosis throughout the thoracic spine. Remaining anatomic lumbar disc spaces are generally preserved. Paraspinous soft  tissues: Unremarkable. IMPRESSION: 1. Multiple minimally displaced fractures of the left first through fifth ribs as well as multiple nondisplaced and mildly displaced fractures of the left thoracic transverse processes, involving at least T1 through T9. 2. Mildly displaced, comminuted fractures of the distal third of the left clavicle. 3. Small left pneumothorax, no greater than 10% volume. 4. Small simple fluid attenuation of the left pleural effusion. 5. Pneumomediastinum and subcutaneous emphysema about the left chest. 6. Heterogeneous, intermediate attenuation lesion in the anterior mediastinum overlying the ascending aorta and pulmonary outflow tract, measuring 3.5 x 2.7 cm with adjacent fat stranding. This is most consistent with a hematoma, although of uncertain origin, and appears to be separated from the aortic arch by a fat plane. Recommend vascular consultation and short interval follow-up to assess for stability. 7. No CT evidence of acute traumatic injury to the abdomen or pelvis. These results were called by telephone at the time of interpretation on 08/26/2023 at 7:15 pm to Dr. Pricilla Loveless , who verbally acknowledged these results. Aortic Atherosclerosis (ICD10-I70.0). Electronically Signed   By: Jearld Lesch M.D.   On: 08/26/2023 19:22   CT L-SPINE NO CHARGE  Result Date: 08/26/2023 CLINICAL DATA:  Trauma, fall from deck 20 feet fall, abrasions to left flank EXAM: CT CHEST, ABDOMEN, AND PELVIS WITH CONTRAST CT THORACIC AND LUMBAR SPINE WITH CONTRAST TECHNIQUE: Multidetector CT imaging of the chest, abdomen and pelvis was performed following the standard protocol during bolus administration of intravenous contrast. Multidetector CT imaging of the thoracic and lumbar spine was performed following the standard protocol during bolus administration of intravenous contrast. RADIATION DOSE REDUCTION: This exam was performed according to the departmental dose-optimization program which includes  automated exposure control, adjustment of the mA and/or kV according to patient size and/or use of iterative reconstruction technique. CONTRAST:  75mL OMNIPAQUE IOHEXOL 350 MG/ML SOLN COMPARISON:  None Available. FINDINGS: CT CHEST FINDINGS Cardiovascular: Aortic atherosclerosis. Heterogeneous, intermediate attenuation lesion in the  anterior mediastinum overlying the ascending aorta and pulmonary outflow tract, measuring 3.5 x 2.7 cm with adjacent fat stranding (series 3, image 33). Normal heart size. No pericardial effusion. Mediastinum/Nodes: Pneumomediastinum in the anterior and superior mediastinum (series 4, image 62). No enlarged mediastinal, hilar, or axillary lymph nodes. Thyroid gland, trachea, and esophagus demonstrate no significant findings. Lungs/Pleura: Small left pneumothorax, no greater than 10% volume. Small left pleural effusion. Musculoskeletal: No chest wall mass or suspicious osseous lesions identified. Mildly displaced, comminuted fractures of the distal third of the left clavicle (series 3, image 5). Multiple minimally displaced fractures of the left first through fifth ribs. Subcutaneous emphysema about the left chest wall. CT ABDOMEN PELVIS FINDINGS Hepatobiliary: No solid liver abnormality is seen. No gallstones, gallbladder wall thickening, or biliary dilatation. Pancreas: Unremarkable. No pancreatic ductal dilatation or surrounding inflammatory changes. Spleen: Normal in size without significant abnormality. Adrenals/Urinary Tract: Adrenal glands are unremarkable. Simple, benign right parapelvic renal cysts, for which no further follow-up or characterization is required. Kidneys are otherwise normal, without renal calculi, solid lesion, or hydronephrosis. Bladder is unremarkable. Stomach/Bowel: Stomach is within normal limits. Appendix appears diminutive although otherwise normal. No evidence of bowel wall thickening, distention, or inflammatory changes. Moderate burden of stool and stool  balls throughout the colon and rectum. Vascular/Lymphatic: Aortic atherosclerosis. No enlarged abdominal or pelvic lymph nodes. Reproductive: No mass or other abnormality. Other: Small, fat containing bilateral inguinal hernias. No ascites. Musculoskeletal: No acute osseous findings. CT THORACIC AND LUMBAR SPINE FINDINGS Alignment: Normal thoracic kyphosis. Normal lumbar lordosis. Vertebral bodies: Multiple nondisplaced and mildly displaced fractures of the left thoracic transverse processes, involving at least T1 through T9. No fractures of the vertebral bodies proper or other posterior elements. Disc spaces: Status post lumbar discectomy and fusion of L3-L5. Mild disc space height loss and osteophytosis throughout the thoracic spine. Remaining anatomic lumbar disc spaces are generally preserved. Paraspinous soft tissues: Unremarkable. IMPRESSION: 1. Multiple minimally displaced fractures of the left first through fifth ribs as well as multiple nondisplaced and mildly displaced fractures of the left thoracic transverse processes, involving at least T1 through T9. 2. Mildly displaced, comminuted fractures of the distal third of the left clavicle. 3. Small left pneumothorax, no greater than 10% volume. 4. Small simple fluid attenuation of the left pleural effusion. 5. Pneumomediastinum and subcutaneous emphysema about the left chest. 6. Heterogeneous, intermediate attenuation lesion in the anterior mediastinum overlying the ascending aorta and pulmonary outflow tract, measuring 3.5 x 2.7 cm with adjacent fat stranding. This is most consistent with a hematoma, although of uncertain origin, and appears to be separated from the aortic arch by a fat plane. Recommend vascular consultation and short interval follow-up to assess for stability. 7. No CT evidence of acute traumatic injury to the abdomen or pelvis. These results were called by telephone at the time of interpretation on 08/26/2023 at 7:15 pm to Dr. Pricilla Loveless , who verbally acknowledged these results. Aortic Atherosclerosis (ICD10-I70.0). Electronically Signed   By: Jearld Lesch M.D.   On: 08/26/2023 19:22   DG Pelvis Portable  Result Date: 08/26/2023 CLINICAL DATA:  trauma EXAM: PORTABLE PELVIS 1-2 VIEWS COMPARISON:  CT abdomen pelvis 08/26/2023 FINDINGS: There is no evidence of pelvic fracture or diastasis. No acute displaced fracture or dislocation of either hips on frontal view. Visualized lower lumbar spine surgical hardware. No pelvic bone lesions are seen. IMPRESSION: Negative for acute traumatic injury. Electronically Signed   By: Tish Frederickson M.D.   On: 08/26/2023 19:18  DG Chest Portable 1 View  Result Date: 08/26/2023 CLINICAL DATA:  trauma.  Fall off deck about 20 feet. EXAM: PORTABLE CHEST 1 VIEW COMPARISON:  CT chest 08/26/2023 FINDINGS: The heart and mediastinal contours are within normal limits. Atherosclerotic plaque. No focal consolidation. No pulmonary edema. No pleural effusion. Known left pneumothorax better evaluated on CT chest 08/26/2023. No right pneumothorax. Multiple acute left rib fractures better evaluated on CT chest 08/26/2023. Acute minimally displaced left T1 transverse process fracture. Acute comminuted and displaced distal left clavicular fracture. IMPRESSION: 1. Known trace to small volume left pneumothorax better evaluated on CT chest 08/26/2023. 2. Multiple acute left rib fractures better evaluated on CT chest 08/26/2023. 3. Acute minimally displaced left T1 transverse process fracture. 4. Acute comminuted and displaced distal left clavicular fracture. 5.  Aortic Atherosclerosis (ICD10-I70.0). These results were called by telephone at the time of interpretation on 08/26/2023 at 7:08 pm to provider Pricilla Loveless , who verbally acknowledged these results. Electronically Signed   By: Tish Frederickson M.D.   On: 08/26/2023 19:16   CT Head Wo Contrast  Result Date: 08/26/2023 CLINICAL DATA:  Head trauma, GCS<=14  (Ped 0-17y); Polytrauma, blunt EXAM: CT HEAD WITHOUT CONTRAST CT CERVICAL SPINE WITHOUT CONTRAST TECHNIQUE: Multidetector CT imaging of the head and cervical spine was performed following the standard protocol without intravenous contrast. Multiplanar CT image reconstructions of the cervical spine were also generated. RADIATION DOSE REDUCTION: This exam was performed according to the departmental dose-optimization program which includes automated exposure control, adjustment of the mA and/or kV according to patient size and/or use of iterative reconstruction technique. COMPARISON:  None Available. FINDINGS: CT HEAD FINDINGS Brain: No evidence of large-territorial acute infarction. No parenchymal hemorrhage. No mass lesion. No extra-axial collection. No mass effect or midline shift. No hydrocephalus. Basilar cisterns are patent. Vascular: No hyperdense vessel. Skull: No acute fracture or focal lesion. Sinuses/Orbits: Paranasal sinuses and mastoid air cells are clear. Bilateral lens replacement. Otherwise the orbits are unremarkable. Other: None. CT CERVICAL SPINE FINDINGS Alignment: Normal. Skull base and vertebrae: Acute displaced left C7 transverse process fracture. No aggressive appearing focal osseous lesion or focal pathologic process. Soft tissues and spinal canal: No prevertebral fluid or swelling. No visible canal hematoma. Upper chest: Trace apical left pneumothorax. Other: Acute displaced left T1 transverse process fracture. Acute displaced posterior 1-3 left rib fracture. Subcutaneus soft tissue emphysema of the left neck and visualized shoulder/back. Acute displaced and comminuted fractured left distal clavicle. IMPRESSION: 1. No acute intracranial abnormality. 2. Acute displaced left C7 transverse process fracture. 3. Acute displaced left T1 transverse process fracture. 4. Acute displaced posterior 1-3 left rib fracture. 5. Trace apical left pneumothorax. 6. Acute displaced and comminuted fractured left  distal clavicle. 7. Please see separately dictated CT chest and CT thoracic spine 08/26/2023. These results were called by telephone at the time of interpretation on 08/26/2023 at 7:08 pm to provider Pricilla Loveless , who verbally acknowledged these results. Electronically Signed   By: Tish Frederickson M.D.   On: 08/26/2023 19:10   CT CERVICAL SPINE WO CONTRAST  Result Date: 08/26/2023 CLINICAL DATA:  Head trauma, GCS<=14 (Ped 0-17y); Polytrauma, blunt EXAM: CT HEAD WITHOUT CONTRAST CT CERVICAL SPINE WITHOUT CONTRAST TECHNIQUE: Multidetector CT imaging of the head and cervical spine was performed following the standard protocol without intravenous contrast. Multiplanar CT image reconstructions of the cervical spine were also generated. RADIATION DOSE REDUCTION: This exam was performed according to the departmental dose-optimization program which includes automated exposure control, adjustment of  the mA and/or kV according to patient size and/or use of iterative reconstruction technique. COMPARISON:  None Available. FINDINGS: CT HEAD FINDINGS Brain: No evidence of large-territorial acute infarction. No parenchymal hemorrhage. No mass lesion. No extra-axial collection. No mass effect or midline shift. No hydrocephalus. Basilar cisterns are patent. Vascular: No hyperdense vessel. Skull: No acute fracture or focal lesion. Sinuses/Orbits: Paranasal sinuses and mastoid air cells are clear. Bilateral lens replacement. Otherwise the orbits are unremarkable. Other: None. CT CERVICAL SPINE FINDINGS Alignment: Normal. Skull base and vertebrae: Acute displaced left C7 transverse process fracture. No aggressive appearing focal osseous lesion or focal pathologic process. Soft tissues and spinal canal: No prevertebral fluid or swelling. No visible canal hematoma. Upper chest: Trace apical left pneumothorax. Other: Acute displaced left T1 transverse process fracture. Acute displaced posterior 1-3 left rib fracture. Subcutaneus  soft tissue emphysema of the left neck and visualized shoulder/back. Acute displaced and comminuted fractured left distal clavicle. IMPRESSION: 1. No acute intracranial abnormality. 2. Acute displaced left C7 transverse process fracture. 3. Acute displaced left T1 transverse process fracture. 4. Acute displaced posterior 1-3 left rib fracture. 5. Trace apical left pneumothorax. 6. Acute displaced and comminuted fractured left distal clavicle. 7. Please see separately dictated CT chest and CT thoracic spine 08/26/2023. These results were called by telephone at the time of interpretation on 08/26/2023 at 7:08 pm to provider Pricilla Loveless , who verbally acknowledged these results. Electronically Signed   By: Tish Frederickson M.D.   On: 08/26/2023 19:10    Anti-infectives: Anti-infectives (From admission, onward)    None        Assessment/Plan FFH   Comminuted and displaced L clavicle fx - per ortho, Dr. Christell Constant, nonop, NWB LUE in sling, follow up 1 week L C7 TP fx - per NSGY, Dr. Jordan Likes, continue c-collar, ok to remove collar for dressing and showering L T1-9 TP fx - pain control L rib fx 1-5 - pain control, pulm toilet. Add mucinex L PTX with SQE - repeat CXR 10/6 stable/improved with decreased trace L pneumothorax. Continue pulm toilet, wean to room air as able (currently on 2L [place]) Mediastinal hematoma - EKG sinus tachy. continue tele Concussion - TBI therapies DM - SSI Anxiety/depression - home meds reordered: buspar, lithium, effexor, valium PRN OSA - CPAP HLD  ID - none FEN - CM diet VTE - SCDs, lovenox Foley - none  Dispo - Med-surg. Labs pending. Pain control - increase robaxin. Continue therapies (currently with recs for HH vs SNF, pt hopes to progress to home health level, he lives at home with his wife that would be there most of the day, he also has a guy that can help as needed).  I reviewed [facility] notes, last 24 h vitals and pain scores, last 48 h  intake and output, last 24 h labs and trends, and last 24 h imaging results.    LOS: 2 days    Franne Forts, [facility] 08/28/2023, 8:48 AM Please see Amion for pager number during day hours 7:00am-4:30pm

## 2023-08-28 NOTE — Progress Notes (Signed)
Physical Therapy Note  (Full treatment note to follow)  SATURATION QUALIFICATIONS: (This note is used to comply with regulatory documentation for home oxygen)  Patient Saturations on Room Air at Rest = 91%  Patient Saturations on Room Air while Ambulating = 85%  Patient Saturations on 3 Liters of oxygen while Ambulating = 92%  Please briefly explain why patient needs home oxygen: Patient requires supplemental oxygen to maintain oxygen saturations at acceptable, safe levels with physical activity.    Van Clines, PT  Acute Rehabilitation Services Office [phone] Secure Chat welcomed

## 2023-08-28 NOTE — Progress Notes (Signed)
Physical Therapy Treatment Patient Details Name: Reason Helzer. MRN: [id number] DOB: [birth year] Today's Date: 08/28/2023   History of Present Illness Pt is a 62 yr old male who presented following a fall of their family's deck. Pt can not recall the fall and what they were doing. Pt was found to have a L clavicle fx with NWB, L rib fxs 1-3 , splaced left C7 transverse process fracture, acute displaced left T1 transverse process fracture now with a hard c collar. PMH: anxiety, DDD, DM, HTN, sleep apnea, lubar fusion, RTKA    PT Comments  Continuing work on functional mobility and activity tolerance;  Session focused on gait, specifically walking in hallways and getting O2 qualifying walk data (see other PT note of this date); Notably more steady in standing and walking, able to walk in the hallway pushing vitals machine with RUE; more steady and L LE without buckling or hyperextension; Needed supplemental O2 to keep O2 sats at acceptable levels with walking; Progressing well; will likely be able to dc home with HHPT follow up; Recommend arranging for at or very near 24 hour supervision/assist   If plan is discharge home, recommend the following: A lot of help with walking and/or transfers;Assistance with cooking/housework;Help with stairs or ramp for entrance   Can travel by private vehicle        Equipment Recommendations  BSC/3in1;Other (comment) (will consider unilateral devcie like cane/maybe crutch)    Recommendations for Other Services       Precautions / Restrictions Precautions Precautions: Cervical;Shoulder;Fall Type of Shoulder Precautions: NWB to LUE, sling for comfort Shoulder Interventions: Shoulder sling/immobilizer Precaution Booklet Issued:  (Soon) Precaution Comments: hard c collar Required Braces or Orthoses: Cervical Brace Cervical Brace: Hard collar;At all times Restrictions LUE Weight Bearing: Non weight bearing     Mobility  Bed Mobility                     Transfers Overall transfer level: Needs assistance Equipment used: 1 person hand held assist Transfers: Sit to/from Stand Sit to Stand: Min assist           General transfer comment: Close guard for safety; smooth rise to standing    Ambulation/Gait Ambulation/Gait assistance: Contact guard assist, +2 safety/equipment Gait Distance (Feet): 150 Feet Assistive device: 1 person hand held assist (pushed vitals machine) Gait Pattern/deviations: Step-through pattern, Decreased step length - right, Decreased step length - left       General Gait Details: Used RUE for support; cues to self-monitor for activity tolerance; no L knee buckling noted; occasionally stopping to cough   Stairs             Wheelchair Mobility     Tilt Bed    Modified Rankin (Stroke Patients Only)       Balance     Sitting balance-Leahy Scale: Good       Standing balance-Leahy Scale: Fair                              Cognition Arousal: Alert Behavior During Therapy: WFL for tasks assessed/performed Overall Cognitive Status: History of cognitive impairments - at baseline                                          Exercises      General Comments  General comments (skin integrity, edema, etc.): See other PT note of this date for O2 qualifying walk      Pertinent Vitals/Pain Pain Assessment Pain Assessment: Faces Faces Pain Scale: Hurts little more Pain Location: neck, and shoulders; L ribs with coughing Pain Descriptors / Indicators: Discomfort, Sore Pain Intervention(s): Monitored during session, Repositioned, Other (comment) (used pillow to spint ribs when coughing)    Home Living                          Prior Function            PT Goals (current goals can now be found in the care plan section) Acute Rehab PT Goals Patient Stated Goal: would like to be able to dc home PT Goal Formulation: With patient Time For Goal  Achievement: 09/10/23 Potential to Achieve Goals: Good Progress towards PT goals: Progressing toward goals    Frequency    Min 1X/week      PT Plan      Co-evaluation              AM-PAC PT "6 Clicks" Mobility   Outcome Measure  Help needed turning from your back to your side while in a flat bed without using bedrails?: A Little Help needed moving from lying on your back to sitting on the side of a flat bed without using bedrails?: A Little Help needed moving to and from a bed to a chair (including a wheelchair)?: A Little Help needed standing up from a chair using your arms (e.g., wheelchair or bedside chair)?: A Little Help needed to walk in hospital room?: A Little Help needed climbing 3-5 steps with a railing? : A Lot 6 Click Score: 17    End of Session Equipment Utilized During Treatment: Gait belt;Other (comment);Cervical collar (sling) Activity Tolerance: Patient tolerated treatment well Patient left: in chair;with call bell/phone within reach;with chair alarm set Nurse Communication: Mobility status;Other (comment) (and O2 sats during amb) PT Visit Diagnosis: Unsteadiness on feet (R26.81);Other abnormalities of gait and mobility (R26.89);Pain Pain - Right/Left: Left Pain - part of body: Shoulder (ribs and back)     Time: 1610-9604 PT Time Calculation (min) (ACUTE ONLY): 24 min  Charges:    $Gait Training: 23-37 mins PT General Charges $$ ACUTE PT VISIT: 1 Visit                     Van Clines, PT  Acute Rehabilitation Services Office [phone] Secure Chat welcomed    [name] 08/28/2023, 5:27 PM

## 2023-08-28 NOTE — Consult Note (Signed)
WOC Nurse Consult Note: Reason for Consult: Fall from porch with multiple orthopedic injuries.  Has abrasions to left arm and left flank from fall Wound type:trauma Pressure Injury POA: NA Measurement: scattered scabbed abrasions Wound bed: scattered abrasions Drainage (amount, consistency, odor) Minimal weeping  no odor Periwound: erythema and tenderness Left arm is in sling.  Sling is gently removed to assess arm.  Dressing procedure/placement/frequency:Cleanse abrasions to left arm and left flank with soap and water and pat dry.  Apply silvadene to abrasions on left arm and left flank.  Cover with dry gauze.  Wrap left arm with kerlix and tape.  Cover left flank with ABD pad and secure with mesh netting.  Change daily.  Will not follow at this time.  Please re-consult if needed.  Mike Gip MSN, RN, FNP-BC CWON Wound, Ostomy, Continence Nurse Outpatient [facility] [phone] Pager [phone]

## 2023-08-28 NOTE — Progress Notes (Signed)
Inpatient Rehab Admissions Coordinator:   Per therapy recommendations,  patient was screened for CIR candidacy by Megan Salon, MS, CCC-SLP. At this time, Pt. Appears to be a a potential candidate for CIR. I will place   order for rehab consult per protocol for full assessment. However, CIR is unlikley to have beds for this Pt. Until later this week, so if he is stable for d/c, recommend TOC fax out to other AIRs. Please contact me any with questions.  Megan Salon, MS, CCC-SLP Rehab Admissions Coordinator  [phone] (celll) [phone] (office)

## 2023-08-28 NOTE — Plan of Care (Signed)
  Problem: Education: Goal: Knowledge of General Education information will improve Description: Including pain rating scale, medication(s)/side effects and non-pharmacologic comfort measures Outcome: Progressing   Problem: Health Behavior/Discharge Planning: Goal: Ability to manage health-related needs will improve Outcome: Progressing   Problem: Clinical Measurements: Goal: Ability to maintain clinical measurements within normal limits will improve Outcome: Progressing Goal: Will remain free from infection Outcome: Progressing   Problem: Activity: Goal: Risk for activity intolerance will decrease Outcome: Progressing   Problem: Pain Managment: Goal: General experience of comfort will improve Outcome: Progressing   Problem: Safety: Goal: Ability to remain free from injury will improve Outcome: Progressing   

## 2023-08-29 ENCOUNTER — Inpatient Hospital Stay (HOSPITAL_COMMUNITY): Payer: Medicare Other

## 2023-08-29 ENCOUNTER — Inpatient Hospital Stay (HOSPITAL_COMMUNITY): Admission: RE | Admit: 2023-08-29 | Payer: Medicare Other | Source: Ambulatory Visit

## 2023-08-29 ENCOUNTER — Encounter (HOSPITAL_COMMUNITY): Payer: Self-pay

## 2023-08-29 LAB — GLUCOSE, CAPILLARY
Glucose-Capillary: 223 mg/dL — ABNORMAL HIGH (ref 70–99)
Glucose-Capillary: 310 mg/dL — ABNORMAL HIGH (ref 70–99)
Glucose-Capillary: 355 mg/dL — ABNORMAL HIGH (ref 70–99)

## 2023-08-29 MED ORDER — ACETAMINOPHEN 500 MG PO TABS: 1000.0000 mg | ORAL_TABLET | Freq: Four times a day (QID) | ORAL | Status: AC | PRN

## 2023-08-29 MED ORDER — LANCET DEVICE MISC: 1.0000 | Freq: Three times a day (TID) | 0 refills | Status: AC

## 2023-08-29 MED ORDER — BLOOD GLUCOSE TEST VI STRP: 1.0000 | ORAL_STRIP | Freq: Three times a day (TID) | 0 refills | Status: AC

## 2023-08-29 MED ORDER — LANCETS MISC. MISC: 1.0000 | Freq: Three times a day (TID) | 0 refills | Status: AC

## 2023-08-29 MED ORDER — BLOOD GLUCOSE MONITORING SUPPL DEVI: 1.0000 | Freq: Three times a day (TID) | 0 refills | Status: AC

## 2023-08-29 MED ORDER — METHOCARBAMOL 500 MG PO TABS: 500.0000 mg | ORAL_TABLET | Freq: Four times a day (QID) | ORAL | 0 refills | Status: DC | PRN

## 2023-08-29 MED ORDER — POLYETHYLENE GLYCOL 3350 17 G PO PACK: 17.0000 g | PACK | Freq: Two times a day (BID) | ORAL | Status: DC

## 2023-08-29 MED ORDER — SILVER SULFADIAZINE 1 % EX CREA: TOPICAL_CREAM | Freq: Every day | CUTANEOUS | Status: AC

## 2023-08-29 MED ORDER — GUAIFENESIN ER 600 MG PO TB12: 600.0000 mg | ORAL_TABLET | Freq: Two times a day (BID) | ORAL | Status: AC | PRN

## 2023-08-29 MED ORDER — POLYETHYLENE GLYCOL 3350 17 G PO PACK: 17.0000 g | PACK | Freq: Every day | ORAL | Status: AC | PRN

## 2023-08-29 MED ORDER — OXYCODONE HCL 10 MG PO TABS: 5.0000 mg | ORAL_TABLET | Freq: Four times a day (QID) | ORAL | 0 refills | Status: DC | PRN

## 2023-08-29 NOTE — Progress Notes (Signed)
At 0214 RN walked in to round on patient. Noted blood on pt's right hand fingers. On further evaluation. Pt picked off the scabs on his left arm causing the wound beds to be exposed. Left arm and flank area cleaned with soap and water. Pet dried, silvadene cream applied, covered with dry dressing. No mash netting available on the unit so minimal medipore tape used to secure abd pad to flank wound

## 2023-08-29 NOTE — Progress Notes (Signed)
Physical Therapy Note  (Full PT session note to follow)  SATURATION QUALIFICATIONS: (This note is used to comply with regulatory documentation for home oxygen)  Patient Saturations on Room Air at Rest = 96%  Patient Saturations on Room Air while Ambulating = 91-97%  Patient does not require supplemental oxygen to maintain oxygen saturations at acceptable, safe levels with physical activity.   Van Clines, PT  Acute Rehabilitation Services Office [phone] Secure Chat welcomed

## 2023-08-29 NOTE — Evaluation (Signed)
Clinical/Bedside Swallow Evaluation Patient Details  Name: Erik Holmes. MRN: [id number] Date of Birth: [birth year]  Today's Date: 08/29/2023 Time: SLP Start Time (ACUTE ONLY): 1109 SLP Stop Time (ACUTE ONLY): 1119 SLP Time Calculation (min) (ACUTE ONLY): 10 min  Past Medical History:  Past Medical History:  Diagnosis Date   Allergy    Anxiety    Cataract    Degenerative disc disease, lumbar    Depression    Diabetes mellitus without complication (HCC)    Diabetes mellitus, type II (HCC)    ED (erectile dysfunction)    GERD (gastroesophageal reflux disease)    Gout    Hypertension    Internal hemorrhoids with prolapse, bleeding 04/24/2014   RLS (restless legs syndrome)    Seizures (HCC)    last seizure around 2016 per patient.   Shortness of breath    Sleep apnea    cpap   Tubular adenoma of colon    multiple, TV adenomas also   Past Surgical History:  Past Surgical History:  Procedure Laterality Date   ANTERIOR LAT LUMBAR FUSION Left 04/30/2013   Procedure: ANTERIOR LATERAL LUMBAR FUSION 1 LEVEL;  Surgeon: Maeola Harman, MD;  Location: [facility];  Service: Neurosurgery;  Laterality: Left;  Lumbar three-four  Anterolateral decompression/fusion/percutaneous pedicle screws    BACK SURGERY     CARDIAC CATHETERIZATION  1998   90's  neg   cataract surgery Bilateral    COLONOSCOPY  multiple   Dr. Karma Lew   EYE SURGERY     cataracts bilateral with lens placement   hemorrhoid banding  2015   LUMBAR PERCUTANEOUS PEDICLE SCREW 1 LEVEL N/A 04/30/2013   Procedure: LUMBAR PERCUTANEOUS PEDICLE SCREW 1 LEVEL;  Surgeon: Maeola Harman, MD;  Location: [facility];  Service: Neurosurgery;  Laterality: N/A;  Lumbar three-four  Anterolateral decompression/fusion/percutaneous pedicle screws   SPINE SURGERY  2008   lumbar fusion   TOTAL KNEE ARTHROPLASTY Right 02/03/2017   Procedure: RIGHT TOTAL KNEE ARTHROPLASTY;  Surgeon: Eugenia Mcalpine, MD;  Location: [facility];  Service:  Orthopedics;  Laterality: Right;   HPI:  Patient is a 62 y.o. male with PMH: anxiety, DDD, DM, HTN, sleep apnea, lubar fusion, RTKA. He was undergoing evaluation at [facility] in [facility] with differential diagnoses of pseudo-dementia of depression, metabolic, post-traumatic, neurodegenerative, mild cognitive impairment. Of note, patient recently had f/u appointment with Dr. Ernestene Kiel at [facility] during which patient reported dysphagia symptoms including increased frequency of choking on food. Modified Barium Swallow study was ordered to be completed at [facility] on an outpatient basis. Patient presented to the ED on 08/26/23 s/p fall from family's deck (20 feet), resulting in multiple injuries including transverse process fractures of the thoracic spine and a C7 transverse process fracture. CT head negative for acute intracranial abnormality.    Assessment / Plan / Recommendation  Clinical Impression  Pt has subjective reports of getting "choked" with thin liquids but has difficulty determining the frequency or any precipitating factors. Oral motor exam nonrevealing. Pt initially presented to radiology for MBS but reported nausea and did not think he could complete study, so clinical evaluation was performed as pt did want to try water and saltines. Pt consumed both with no overt signs of dysphagia or aspiration. He did have some eructation and hiccups though. Based on the above would continue with unrestricted textures as possible, but after completion, pt felt that he was agreeable to complete MBS so proceeded with further testing as initialy planned. SLP  Visit Diagnosis: Dysphagia, unspecified (R13.10)    Aspiration Risk  Mild aspiration risk    Diet Recommendation Regular;Thin liquid    Liquid Administration via: Cup;Straw Medication Administration: Whole meds with liquid Supervision: Patient able to self feed Compensations: Minimize environmental distractions;Slow rate;Small sips/bites Postural  Changes: Seated upright at 90 degrees;Remain upright for at least 30 minutes after po intake    Other  Recommendations Oral Care Recommendations: Oral care BID    Recommendations for follow up therapy are one component of a multi-disciplinary discharge planning process, led by the attending physician.  Recommendations may be updated based on patient status, additional functional criteria and insurance authorization.  Follow up Recommendations  (tba)      Assistance Recommended at Discharge    Functional Status Assessment Patient has had a recent decline in their functional status and demonstrates the ability to make significant improvements in function in a reasonable and predictable amount of time.  Frequency and Duration            Prognosis Prognosis for improved oropharyngeal function: Good      Swallow Study   General HPI: Patient is a 62 y.o. male with PMH: anxiety, DDD, DM, HTN, sleep apnea, lubar fusion, RTKA. He was undergoing evaluation at [facility] in 2015 with differential diagnoses of pseudo-dementia of depression, metabolic, post-traumatic, neurodegenerative, mild cognitive impairment. Of note, patient recently had f/u appointment with Dr. Ernestene Kiel at [facility] during which patient reported dysphagia symptoms including increased frequency of choking on food. Modified Barium Swallow study was ordered to be completed at [facility] on an outpatient basis. Patient presented to the ED on 08/26/23 s/p fall from family's deck (20 feet), resulting in multiple injuries including transverse process fractures of the thoracic spine and a C7 transverse process fracture. CT head negative for acute intracranial abnormality. Type of Study: Bedside Swallow Evaluation Previous Swallow Assessment: none in chart Diet Prior to this Study: Regular;Thin liquids (Level 0) Temperature Spikes Noted: No Respiratory Status: Room air History of Recent Intubation: No Behavior/Cognition:  Alert;Cooperative;Pleasant mood Oral Cavity Assessment: Within Functional Limits Oral Care Completed by SLP: No Oral Cavity - Dentition: Adequate natural dentition;Poor condition Vision: Functional for self-feeding Self-Feeding Abilities: Able to feed self Patient Positioning: Upright in bed Baseline Vocal Quality: Normal Volitional Swallow: Able to elicit    Oral/Motor/Sensory Function Overall Oral Motor/Sensory Function: Within functional limits   Ice Chips Ice chips: Not tested   Thin Liquid Thin Liquid: Within functional limits Presentation: Cup;Self Fed    Nectar Thick Nectar Thick Liquid: Not tested   Honey Thick Honey Thick Liquid: Not tested   Puree Puree: Not tested   Solid     Solid: Within functional limits Presentation: Self Fed      Mahala Menghini., M.A. CCC-SLP Acute Rehabilitation Services Office [phone]  Secure chat preferred  08/29/2023,12:50 PM

## 2023-08-29 NOTE — Progress Notes (Signed)
Patient ID: Erik Gammon., male   DOB: 02-Feb-1961, 61 y.o.   MRN: [id number] [facility] Progress Note     Subjective: CC-  Continues to have pain, mostly from rib fractures. Pain medication helps. Only took morphine once yesterday. Was able to cough up a lot of phlegm. Denies SOB but remains on 2L [place].  Denies abdominal pain, n/v. Tolerating diet. Passing flatus, no BM.  Wife will be with him most of the time after discharge. He has two other guys that will be able to help.  Objective: Vital signs in last 24 hours: Temp:  [97.8 F (36.6 C)-98.6 F (37 C)] 98.1 F (36.7 C) (10/08 0438) Pulse Rate:  [69-83] 73 (10/08 0438) Resp:  [17-20] 20 (10/08 0438) BP: (131-153)/(80-96) 135/80 (10/08 0438) SpO2:  [89 %-96 %] 96 % (10/08 0438) FiO2 (%):  [21 %] 21 % (10/07 2046) Last BM Date : 08/25/23  Intake/Output from previous day: 10/07 0701 - 10/08 0700 In: 240 [P.O.:240] Out: 1350 [Urine:1350] Intake/Output this shift: No intake/output data recorded.  PE: Gen:  Alert, NAD, pleasant HEENT: c-collar in place. EOM's intact, pupils equal and round Card:  RRR, 2+ radial and DP pulses Pulm:  CTAB, rate and effort normal on 2L [place]. Pulling 1000 on IS Abd: Soft, NT/ND, +BS LUE: sling to LUE, NVI. Extensive ecchymosis over left shoulder Ext:  calves soft and nontender without edema Psych: A&Ox3 Skin: no rashes noted, warm and dry  Lab Results:  Recent Labs    08/27/23 0959 08/28/23 1257  WBC 10.8* 12.9*  HGB 14.9 16.5  HCT 43.9 48.6  PLT 263 259   BMET Recent Labs    08/27/23 0959 08/28/23 1257  NA 135 134*  K 4.0 4.1  CL 101 100  CO2 25 22  GLUCOSE 242* 309*  BUN 12 15  CREATININE 1.04 1.03  CALCIUM 8.5* 8.8*   PT/INR Recent Labs    08/26/23 1740  LABPROT 13.4  INR 1.0   CMP     Component Value Date/Time   NA 134 (L) 08/28/2023 1257   K 4.1 08/28/2023 1257   CL 100 08/28/2023 1257   CO2 22 08/28/2023 1257   GLUCOSE 309 (H) 08/28/2023  1257   BUN 15 08/28/2023 1257   CREATININE 1.03 08/28/2023 1257   CALCIUM 8.8 (L) 08/28/2023 1257   PROT 7.3 08/26/2023 1740   ALBUMIN 4.5 08/26/2023 1740   AST 42 (H) 08/26/2023 1740   ALT 30 08/26/2023 1740   ALKPHOS 42 08/26/2023 1740   BILITOT 0.6 08/26/2023 1740   GFRNONAA >60 08/28/2023 1257   GFRAA >60 02/05/2017 0355   Lipase     Component Value Date/Time   LIPASE 12 01/26/2012 0915       Studies/Results: DG Chest Port 1 View  Result Date: 08/28/2023 CLINICAL DATA:  62 year old male with history of respiratory failure. EXAM: PORTABLE CHEST 1 VIEW COMPARISON:  Chest x-ray 08/27/2023. FINDINGS: Multiple left-sided rib fractures again noted, better demonstrated on prior CT examination. Lung volumes are low. Trace residual left-sided pneumothorax appears decreased compared to the recent prior study. No right pneumothorax. Elevation of the right hemidiaphragm. Bibasilar opacities (right greater than left) likely atelectatic. No definite acute consolidative airspace disease. No pleural effusions. Unusual linear density adjacent to the upper left mediastinum corresponding to some pleural fat outlined by pneumomediastinum on recent chest CT. Heart size is normal. Extensive subcutaneous emphysema in the left chest wall and axillary region. Highly comminuted fracture of the distal  left clavicle again noted. IMPRESSION: 1. Trace residual left pneumothorax appears decreased compared to the prior study. 2. Elevated right hemidiaphragm slightly increased with increasing basilar subsegmental atelectasis. 3. Persistent pneumomediastinum. 4. Multiple left-sided rib fractures and distal left comminuted clavicular fracture better demonstrated on prior chest CT. Electronically Signed   By: Trudie Reed M.D.   On: 08/28/2023 05:24   DG Chest Port 1 View  Result Date: 08/27/2023 CLINICAL DATA:  Respiratory failure. Left-sided pneumothorax. Fall yesterday. EXAM: PORTABLE CHEST 1 VIEW COMPARISON:   One-view chest x-ray 08/25/2023 FINDINGS: The heart size is normal. Lung volumes are low. Dependent atelectasis is present bilaterally. Tiny left apical pneumothorax is again noted. No significant airspace consolidation is present. IMPRESSION: 1. Stable tiny left apical pneumothorax. 2. Low lung volumes and bibasilar atelectasis. Electronically Signed   By: Marin Roberts M.D.   On: 08/27/2023 12:54    Anti-infectives: Anti-infectives (From admission, onward)    None        Assessment/Plan FFH   Comminuted and displaced L clavicle fx - per ortho, Dr. Christell Constant, nonop, NWB LUE in sling, follow up 1 week L C7 TP fx - per NSGY, Dr. Jordan Likes, continue c-collar, ok to remove collar for dressing and showering L T1-9 TP fx - pain control L rib fx 1-5 - pain control, pulm toilet. Add mucinex L PTX with SQE - repeat CXR 10/6 stable/improved with decreased trace L pneumothorax. Continue pulm toilet, wean to room air as able (currently on 2L ) Mediastinal hematoma - EKG sinus tachy. continue tele Concussion - TBI therapies DM - SSI. A1c 10.4, will ask diabetes coordinator to see. Needs follow up with PCP after discharge Anxiety/depression - home meds reordered: buspar, lithium, effexor, valium PRN OSA - CPAP HLD Former smoker - quit July 2024   ID - none FEN - CM diet VTE - SCDs, lovenox Foley - none   Dispo - Med-surg. Continue therapies (currently with recs for [facility] PT, SLP to see today). Possible discharge later today if he weans off supplemental oxygen and progresses well with therapies. He has a wife and "2 guys" that can help after discharge.  I reviewed last 24 h vitals and pain scores, last 48 h intake and output, and last 24 h labs and trends.    LOS: 3 days    Franne Forts, [facility] Surgery 08/29/2023, 8:55 AM Please see Amion for pager number during day hours 7:00am-4:30pm

## 2023-08-29 NOTE — Discharge Summary (Signed)
[facility] Discharge Summary   Patient ID: Erik Holmes. MRN: [id number] DOB/AGE: 03-08-1961 62 y.o.  Admit date: 08/26/2023 Discharge date: 08/29/2023   Discharge Diagnosis Fall from height Comminuted and displaced Left clavicle fracture Left C7 transverse process fracture Left T1-9 transverse process fracture Left rib fracture 1-5 Left pneumothorax with subcutaneous emphysema Mediastinal hematoma  Concussion  DM  Anxiety/depression  OSA  HLD Former smoker   Consultants None  Imaging: DG Swallowing Func-Speech Pathology  Result Date: 08/29/2023 Table formatting from the original result was not included. Modified Barium Swallow Study Patient Details Name: Erik Holmes. MRN: [id number] Date of Birth: 07/08/1961 Today's Date: 08/29/2023 HPI/PMH: HPI: Patient is a 62 y.o. male with PMH: anxiety, DDD, DM, HTN, sleep apnea, lubar fusion, RTKA. He was undergoing evaluation at [facility] in 2015 with differential diagnoses of pseudo-dementia of depression, metabolic, post-traumatic, neurodegenerative, mild cognitive impairment. Of note, patient recently had f/u appointment with Dr. Ernestene Kiel at [facility] during which patient reported dysphagia symptoms including increased frequency of choking on food. Modified Barium Swallow study was ordered to be completed at [facility] on an outpatient basis. Patient presented to the ED on 08/26/23 s/p fall from family's deck (20 feet), resulting in multiple injuries including transverse process fractures of the thoracic spine and a C7 transverse process fracture. CT head negative for acute intracranial abnormality. Clinical Impression: Clinical Impression: MBS was completed with limited trials given pt nausea. He did have a functional oropharyngeal swallow across consistencies tested though. He has occasional penetration with thin liquids due to mistiming, but he has adequate airway protective mechanisms so that penetrates are  spontaneously ejected (PAS 2, considered to be normal). No aspiration occurred. Pt did have slightly reduced duration of UES opening with trace amounts of barium remaining not fully clearing or even getting squeezed back into his pyriform sinuses. Most significant was when the barium tablet got stuck above the UES, but a second spontaneous swallow cleared this readily. Question if this could be contributing to his intermittent symptoms but no overt coughing was seen during study today. Would continue with regular solids and thin liquids as tolerated with aspiration precautions. Encouraged him to reduce distractions during intake as well. Factors that may increase risk of adverse event in presence of aspiration Rubye Oaks [employer] Clearance Coots 2021): No data recorded Recommendations/Plan: Swallowing Evaluation Recommendations Swallowing Evaluation Recommendations Recommendations: PO diet PO Diet Recommendation: Regular; Thin liquids (Level 0) Liquid Administration via: Cup; Straw Medication Administration: Whole meds with liquid Supervision: Patient able to self-feed Swallowing strategies  : Minimize environmental distractions; Slow rate; Small bites/sips Postural changes: Position pt fully upright for meals; Stay upright 30-60 min after meals Oral care recommendations: Oral care BID (2x/day) Treatment Plan Treatment Plan Treatment recommendations: Therapy as outlined in treatment plan below Follow-up recommendations: Home health SLP Functional status assessment: Patient has had a recent decline in their functional status and demonstrates the ability to make significant improvements in function in a reasonable and predictable amount of time. Treatment frequency: Min 2x/week Treatment duration: 1 week Interventions: Aspiration precaution training; Compensatory techniques; Patient/family education; Diet toleration management by SLP Recommendations Recommendations for follow up therapy are one component of a multi-disciplinary  discharge planning process, led by the attending physician.  Recommendations may be updated based on patient status, additional functional criteria and insurance authorization. Assessment: Orofacial Exam: Orofacial Exam Oral Cavity: Oral Hygiene: WFL Oral Cavity - Dentition: Adequate natural dentition; Poor condition Orofacial Anatomy: WFL Oral Motor/Sensory Function: WFL Anatomy: Anatomy:  WFL Boluses Administered: Boluses Administered Boluses Administered: Thin liquids (Level 0); Puree; Solid (limited secondary to pt nausea)  Oral Impairment Domain: Oral Impairment Domain Lip Closure: No labial escape Tongue control during bolus hold: Not tested Bolus preparation/mastication: Slow prolonged chewing/mashing with complete recollection Bolus transport/lingual motion: Brisk tongue motion Oral residue: Trace residue lining oral structures Location of oral residue : Tongue Initiation of pharyngeal swallow : Pyriform sinuses  Pharyngeal Impairment Domain: Pharyngeal Impairment Domain Soft palate elevation: No bolus between soft palate (SP)/pharyngeal wall (PW) Laryngeal elevation: Complete superior movement of thyroid cartilage with complete approximation of arytenoids to epiglottic petiole Anterior hyoid excursion: Complete anterior movement Epiglottic movement: Complete inversion Laryngeal vestibule closure: Complete, no air/contrast in laryngeal vestibule Pharyngeal stripping wave : Present - complete Pharyngeal contraction (A/P view only): N/A Pharyngoesophageal segment opening: Partial distention/partial duration, partial obstruction of flow Tongue base retraction: No contrast between tongue base and posterior pharyngeal wall (PPW) Pharyngeal residue: Trace residue within or on pharyngeal structures Location of pharyngeal residue: Pyriform sinuses  Esophageal Impairment Domain: Esophageal Impairment Domain Esophageal clearance upright position: Esophageal retention with retrograde flow through the PES Pill: Pill  Consistency administered: Thin liquids (Level 0) Thin liquids (Level 0): Impaired (see clinical impressions) Penetration/Aspiration Scale Score: Penetration/Aspiration Scale Score 1.  Material does not enter airway: Puree; Solid; Pill 2.  Material enters airway, remains ABOVE vocal cords then ejected out: Thin liquids (Level 0) Compensatory Strategies: No data recorded  General Information: Caregiver present: No  Diet Prior to this Study: Regular; Thin liquids (Level 0)   Temperature : Normal   Respiratory Status: WFL   Supplemental O2: None (Room air)   History of Recent Intubation: No  Behavior/Cognition: Alert; Cooperative; Pleasant mood Self-Feeding Abilities: Able to self-feed Baseline vocal quality/speech: Normal No data recorded Volitional Swallow: Able to elicit Exam Limitations: Other (comment) (slightly shortened eval due to nausea) Goal Planning: Prognosis for improved oropharyngeal function: Good Barriers to Reach Goals: Cognitive deficits No data recorded Patient/Family Stated Goal: to assess swallowing Consulted and agree with results and recommendations: Patient Pain: Pain Assessment Pain Assessment: Faces Pain Score: 6 Faces Pain Scale: 2 Pain Location: generalized Pain Descriptors / Indicators: Discomfort; Sore Pain Intervention(s): Limited activity within patient's tolerance; Monitored during session End of Session: Start Time:SLP Start Time (ACUTE ONLY): 1119 Stop Time: SLP Stop Time (ACUTE ONLY): 1135 Time Calculation:SLP Time Calculation (min) (ACUTE ONLY): 16 min Charges: SLP Evaluations $ SLP Speech Visit: 1 Visit SLP Evaluations $BSS Swallow: 1 Procedure $MBS Swallow: 1 Procedure SLP visit diagnosis: SLP Visit Diagnosis: Dysphagia, pharyngoesophageal phase (R13.14) Past Medical History: Past Medical History: Diagnosis Date  Allergy   Anxiety   Cataract   Degenerative disc disease, lumbar   Depression   Diabetes mellitus without complication (HCC)   Diabetes mellitus, type II (HCC)   ED  (erectile dysfunction)   GERD (gastroesophageal reflux disease)   Gout   Hypertension   Internal hemorrhoids with prolapse, bleeding 04/24/2014  RLS (restless legs syndrome)   Seizures (HCC)   last seizure around 2016 per patient.  Shortness of breath   Sleep apnea   cpap  Tubular adenoma of colon   multiple, TV adenomas also Past Surgical History: Past Surgical History: Procedure Laterality Date  ANTERIOR LAT LUMBAR FUSION Left 04/30/2013  Procedure: ANTERIOR LATERAL LUMBAR FUSION 1 LEVEL;  Surgeon: Maeola Harman, MD;  Location: [facility];  Service: Neurosurgery;  Laterality: Left;  Lumbar three-four  Anterolateral decompression/fusion/percutaneous pedicle screws   BACK SURGERY  CARDIAC CATHETERIZATION  1998  90's  neg  cataract surgery Bilateral   COLONOSCOPY  multiple  Dr. Karma Lew  EYE SURGERY    cataracts bilateral with lens placement  hemorrhoid banding  2015  LUMBAR PERCUTANEOUS PEDICLE SCREW 1 LEVEL N/A 04/30/2013  Procedure: LUMBAR PERCUTANEOUS PEDICLE SCREW 1 LEVEL;  Surgeon: Maeola Harman, MD;  Location: [facility];  Service: Neurosurgery;  Laterality: N/A;  Lumbar three-four  Anterolateral decompression/fusion/percutaneous pedicle screws  SPINE SURGERY  2008  lumbar fusion  TOTAL KNEE ARTHROPLASTY Right 02/03/2017  Procedure: RIGHT TOTAL KNEE ARTHROPLASTY;  Surgeon: Eugenia Mcalpine, MD;  Location: [facility];  Service: Orthopedics;  Laterality: Right; Mahala Menghini., M.A. CCC-SLP Acute Rehabilitation Services Office [phone] Secure chat preferred 08/29/2023, 1:31 PM  DG Chest Port 1 View  Result Date: 08/28/2023 CLINICAL DATA:  62 year old male with history of respiratory failure. EXAM: PORTABLE CHEST 1 VIEW COMPARISON:  Chest x-ray 08/27/2023. FINDINGS: Multiple left-sided rib fractures again noted, better demonstrated on prior CT examination. Lung volumes are low. Trace residual left-sided pneumothorax appears decreased compared to the recent prior study. No right pneumothorax. Elevation of the right  hemidiaphragm. Bibasilar opacities (right greater than left) likely atelectatic. No definite acute consolidative airspace disease. No pleural effusions. Unusual linear density adjacent to the upper left mediastinum corresponding to some pleural fat outlined by pneumomediastinum on recent chest CT. Heart size is normal. Extensive subcutaneous emphysema in the left chest wall and axillary region. Highly comminuted fracture of the distal left clavicle again noted. IMPRESSION: 1. Trace residual left pneumothorax appears decreased compared to the prior study. 2. Elevated right hemidiaphragm slightly increased with increasing basilar subsegmental atelectasis. 3. Persistent pneumomediastinum. 4. Multiple left-sided rib fractures and distal left comminuted clavicular fracture better demonstrated on prior chest CT. Electronically Signed   By: Trudie Reed M.D.   On: 08/28/2023 05:24    Procedures None  HPI:  Erik Burich. is a 62 y.o. male who presented to the ED 10/5 after [facility] off a porch. Amnestic to the event. C/o pain wherever actively being touched/examined. Two children, Erik Holmes and Erik Holmes at bedside.  Trauma asked to see for admission.  Hospital Course: Below is a list of their traumatic injuries and their management:  Comminuted and displaced L clavicle fx  Per ortho, Dr. Christell Constant, nonop, NWB LUE in sling, follow up 1 week  L C7 TP fx  Per NSGY, Dr. Jordan Likes, continue c-collar, ok to remove collar for dressing and showering  L T1-9 TP fx  Pain control  L rib fx 1-5  Pain control, pulm toilet  L PTX with SQE  Repeat CXR 10/6 stable/improved with decreased trace L pneumothorax. Continue pulm toilet, wean to room air.  Mediastinal hematoma  EKG sinus tachy. Monitored on telemetry   Concussion  TBI therapies   Patient worked with therapies during this admission. On 10/8 the patient was felt stable for discharge home.  Patient will follow up as below and knows to call with questions  or concerns.    I have personally reviewed the patients medication history on the [place] controlled substance database.     Allergies as of 08/29/2023       Reactions   Penicillins Anaphylaxis, Other (See Comments)   Has patient had a PCN reaction causing immediate rash, facial/tongue/throat swelling, SOB or lightheadedness with hypotension:Yes Has patient had a PCN reaction causing severe rash involving mucus membranes or skin necrosis:No Has patient had a PCN reaction that required hospitalization:Treated in ER for reaction  Has patient had a PCN reaction occurring within the last 10 years:Yes If all of the above answers are "NO", then may proceed with Cephalosporin use.   Codeine Other (See Comments)   Made pt feel like skin was crawling   Other Other (See Comments)   "bee stings"   Sildenafil Citrate Other (See Comments)   Xanax [alprazolam]    Made him feel angry        Medication List     TAKE these medications    acetaminophen 500 MG tablet Commonly known as: TYLENOL Take 2 tablets (1,000 mg total) by mouth every 6 (six) hours as needed.   busPIRone 15 MG tablet Commonly known as: BUSPAR 1 bid   diazepam 5 MG tablet Commonly known as: Valium Take 1 tablet (5 mg total) by mouth 2 (two) times daily as needed for anxiety.   EpiPen 2-Pak 0.3 mg/0.3 mL Soaj injection Generic drug: EPINEPHrine Take 0.3 mg by mouth daily as needed for anaphylaxis.   guaiFENesin 600 MG 12 hr tablet Commonly known as: MUCINEX Take 1 tablet (600 mg total) by mouth 2 (two) times daily as needed for to loosen phlegm.   lithium carbonate 300 MG capsule 1qam and 2qhs What changed:  how much to take how to take this when to take this   metFORMIN 500 MG 24 hr tablet Commonly known as: GLUCOPHAGE-XR Take 1,000 mg by mouth 2 (two) times a day.   methocarbamol 500 MG tablet Commonly known as: ROBAXIN Take 1-2 tablets (500-1,000 mg total) by mouth every 6 (six) hours as needed for muscle  spasms.   NON FORMULARY CPAP AT BEDTIME   Oxycodone HCl 10 MG Tabs Take 0.5-1 tablets (5-10 mg total) by mouth every 6 (six) hours as needed for moderate pain or severe pain (5mg  for moderate pain, 10mg  for severe pain).   pioglitazone 15 MG tablet Commonly known as: ACTOS Take 15 mg by mouth daily.   polyethylene glycol 17 g packet Commonly known as: MIRALAX / GLYCOLAX Take 17 g by mouth daily as needed for mild constipation.   rosuvastatin 5 MG tablet Commonly known as: CRESTOR Take 5 mg by mouth daily.   silver sulfADIAZINE 1 % cream Commonly known as: SILVADENE Apply topically daily. Start taking on: August 30, 2023   venlafaxine XR 150 MG 24 hr capsule Commonly known as: EFFEXOR-XR TAKE 2 CAPSULES BY MOUTH DAILY  WITH BREAKFAST          Follow-up Information     London Sheer, MD. Schedule an appointment as soon as possible for a visit in 1 week(s).   Specialty: Orthopedic Surgery Why: Follow up 1 week regarding clavicle fracture Contact information: [address] [place] [address] [phone]         Julio Sicks, MD. Call.   Specialty: Neurosurgery Why: Follow up regarding neck/back transverse process fractures Contact information: [address] [place] [address] [phone]         Blair Heys, MD. Call.   Specialty: Family Medicine Why: Recommend post-hospitalization follow up appointment for rib fractures. also discuss diabetes management (A1c 10) Contact information: [address] [place] [address] [phone]                  Signed: Franne Forts, PA-C [facility] 08/29/2023, 1:42 PM Please see Amion for pager number during day hours 7:00am-4:30pm

## 2023-08-29 NOTE — Progress Notes (Signed)
Physical Therapy Treatment Patient Details Name: Erik Holmes. MRN: [id number] DOB: 1961-04-24 Today's Date: 08/29/2023   History of Present Illness Pt is a 62 yr old male who presented following a fall of their family's deck. Pt can not recall the fall and what they were doing. Pt was found to have a L clavicle fx with NWB, L rib fxs 1-3 , splaced left C7 transverse process fracture, acute displaced left T1 transverse process fracture now with a hard c collar. PMH: anxiety, DDD, DM, HTN, sleep apnea, lubar fusion, RTKA    PT Comments  Continuing work on functional mobility and activity tolerance;  Session focused on progressive amb and assessment of O2 sats with activity on room air; Managing well, and O2 sats stayed stable; Questions solicited and answered; OK for dc home from PT standpoint     If plan is discharge home, recommend the following: A little help with walking and/or transfers   Can travel by private vehicle        Equipment Recommendations  BSC/3in1 (has a cane)    Recommendations for Other Services       Precautions / Restrictions Precautions Precautions: Cervical;Shoulder;Fall Type of Shoulder Precautions: NWB to LUE, sling for comfort Shoulder Interventions: Shoulder sling/immobilizer Precaution Booklet Issued: No Precaution Comments: hard c collar Required Braces or Orthoses: Cervical Brace Cervical Brace: Hard collar;At all times;Other (comment) (can take off for shower/dressing) Restrictions Weight Bearing Restrictions: Yes LUE Weight Bearing: Non weight bearing     Mobility  Bed Mobility Overal bed mobility: Needs Assistance Bed Mobility: Supine to Sit     Supine to sit: Contact guard     General bed mobility comments: used bedrails, no physical assist needed    Transfers Overall transfer level: Needs assistance Equipment used: None Transfers: Sit to/from Stand Sit to Stand: Supervision           General transfer comment:  supervision for ambulation around room and transfers.    Ambulation/Gait Ambulation/Gait assistance: Contact guard assist, Supervision Gait Distance (Feet): 150 Feet Assistive device:  (pushed vitals machine) Gait Pattern/deviations: Step-through pattern, Decreased step length - right, Decreased step length - left Gait velocity: slowed     General Gait Details: Used RUE for support; cues to self-monitor for activity tolerance; no L knee buckling noted; occasionally stopping to cough   Stairs             Wheelchair Mobility     Tilt Bed    Modified Rankin (Stroke Patients Only)       Balance     Sitting balance-Leahy Scale: Good       Standing balance-Leahy Scale: Good                              Cognition Arousal: Alert Behavior During Therapy: WFL for tasks assessed/performed Overall Cognitive Status: History of cognitive impairments - at baseline                                 General Comments: Pt reported they have dementia and it is sometimes hard to get started in the AM.        Exercises      General Comments General comments (skin integrity, edema, etc.): O2 sats stable with activity on room air      Pertinent Vitals/Pain Pain Assessment Pain Assessment: Faces Faces Pain Scale: Hurts  little more Pain Location: neck, and shoulders; L ribs with coughing Pain Descriptors / Indicators: Discomfort, Sore Pain Intervention(s): Monitored during session (used pillow splinting with cough)    Home Living                          Prior Function            PT Goals (current goals can now be found in the care plan section) Acute Rehab PT Goals Patient Stated Goal: would like to be able to dc home PT Goal Formulation: With patient Time For Goal Achievement: 09/10/23 Potential to Achieve Goals: Good Progress towards PT goals: Progressing toward goals    Frequency    Min 1X/week      PT Plan       Co-evaluation              AM-PAC PT "6 Clicks" Mobility   Outcome Measure  Help needed turning from your back to your side while in a flat bed without using bedrails?: None Help needed moving from lying on your back to sitting on the side of a flat bed without using bedrails?: None Help needed moving to and from a bed to a chair (including a wheelchair)?: A Little Help needed standing up from a chair using your arms (e.g., wheelchair or bedside chair)?: A Little Help needed to walk in hospital room?: A Little Help needed climbing 3-5 steps with a railing? : A Lot 6 Click Score: 19    End of Session Equipment Utilized During Treatment: Gait belt;Other (comment);Cervical collar (sling) Activity Tolerance: Patient tolerated treatment well Patient left: in chair;with call bell/phone within reach;with chair alarm set Nurse Communication: Mobility status;Other (comment) (and O2 sats during amb) PT Visit Diagnosis: Unsteadiness on feet (R26.81);Other abnormalities of gait and mobility (R26.89);Pain Pain - Right/Left: Left Pain - part of body: Shoulder (ribs and back)     Time: 8119-1478 PT Time Calculation (min) (ACUTE ONLY): 28 min  Charges:    $Gait Training: 23-37 mins PT General Charges $$ ACUTE PT VISIT: 1 Visit                     Van Clines, PT  Acute Rehabilitation Services Office [phone] Secure Chat welcomed    Erik Holmes 08/29/2023, 1:55 PM

## 2023-08-29 NOTE — Progress Notes (Signed)
Occupational Therapy Treatment Patient Details Name: Olson Lucarelli. MRN: [id number] DOB: July 16, 1961 Today's Date: 08/29/2023   History of present illness Pt is a 62 yr old male who presented following a fall of their family's deck. Pt can not recall the fall and what they were doing. Pt was found to have a L clavicle fx with NWB, L rib fxs 1-3 , splaced left C7 transverse process fracture, acute displaced left T1 transverse process fracture now with a hard c collar. PMH: anxiety, DDD, DM, HTN, sleep apnea, lubar fusion, RTKA   OT comments  Pt in good spirits, eager to return home, states he will  be going home with wife later today. Pt displayed good balance and ability to transfer with supervision, no AD used, no LOB. Pt able to stand and converse throughout session, ambulate around room as needed. Pt states he has assistance at home from wife when she's not working, and plans to have grandson assist during the day. Pt states he has no needs for postacute OT, despite being NWB LUE and needing assist for bathing/dressing. Pt is doing good with mobility, able to manage clothing during toileting and complete hygiene as needed, will get assist from wife/grandson with dressing/bathing as needed. Pt instructed on safety with dressing/bathing, precautions with LUE NWB status and sling use, and precautions with C-collar. Pt verbalized understanding and stated he had no further OT or follow up needs, no need for DME back to home, states he has everything to remain safe/independent. Pt to follow doctors recommendations for therapy after follow up for L clavicle in a week. Will continue to see acutely to monitor progress as able.       If plan is discharge home, recommend the following:  A little help with walking and/or transfers;A little help with bathing/dressing/bathroom;Assistance with cooking/housework;Assist for transportation;Help with stairs or ramp for entrance   Equipment Recommendations  None  recommended by OT    Recommendations for Other Services      Precautions / Restrictions Precautions Precautions: Cervical;Shoulder;Fall Type of Shoulder Precautions: NWB to LUE, sling for comfort Shoulder Interventions: Shoulder sling/immobilizer Precaution Booklet Issued: No Precaution Comments: hard c collar Required Braces or Orthoses: Cervical Brace Cervical Brace: Hard collar;At all times;Other (comment) (can take off for shower/dressing) Restrictions Weight Bearing Restrictions: Yes LUE Weight Bearing: Non weight bearing       Mobility Bed Mobility Overal bed mobility: Needs Assistance             General bed mobility comments: arrived in chair    Transfers Overall transfer level: Needs assistance Equipment used: None Transfers: Sit to/from Stand, Bed to chair/wheelchair/BSC Sit to Stand: Supervision           General transfer comment: supervision for ambulation around room and transfers.     Balance Overall balance assessment: Needs assistance Sitting-balance support: No upper extremity supported, Feet supported Sitting balance-Leahy Scale: Good     Standing balance support: No upper extremity supported Standing balance-Leahy Scale: Good Standing balance comment: good standing balance today, no LOB, supervision                           ADL either performed or assessed with clinical judgement   ADL Overall ADL's : Needs assistance/impaired Eating/Feeding: Minimal assistance;Sitting                   Lower Body Dressing: Maximal assistance;Sitting/lateral leans;Sit to/from stand   Toilet Transfer: Supervision/safety;Ambulation;Regular  Social worker and Hygiene: Set up;Sitting/lateral lean       Functional mobility during ADLs: Supervision/safety General ADL Comments: Pt improving with mobility, supervision for transfers/standing balance. Pt has good use of RUE to complete basic ADLs, duet o LUE  NWB and sling, will requires min-mod A for dressing/bathing.    Extremity/Trunk Assessment Upper Extremity Assessment Upper Extremity Assessment: LUE deficits/detail LUE Deficits / Details: due to fx limited AROM of shoulder at this time but WFL distally at this time with intact sensation LUE Sensation: WNL LUE Coordination: decreased gross motor            Vision       Perception     Praxis      Cognition Arousal: Alert Behavior During Therapy: WFL for tasks assessed/performed Overall Cognitive Status: History of cognitive impairments - at baseline                                 General Comments: Pt reported they have dementia and it is sometimes hard to get started in the AM.        Exercises      Shoulder Instructions       General Comments      Pertinent Vitals/ Pain       Pain Assessment Pain Assessment: 0-10 Pain Score: 6  Pain Location: neck, and shoulders; L ribs with coughing Pain Descriptors / Indicators: Discomfort, Sore Pain Intervention(s): Monitored during session  Home Living                                          Prior Functioning/Environment              Frequency  Min 2X/week        Progress Toward Goals  OT Goals(current goals can now be found in the care plan section)  Progress towards OT goals: Progressing toward goals  Acute Rehab OT Goals Patient Stated Goal: to return home OT Goal Formulation: With patient Time For Goal Achievement: 09/10/23 Potential to Achieve Goals: Good ADL Goals Pt Will Perform Upper Body Bathing: with contact guard assist;sitting Pt Will Perform Lower Body Bathing: with min assist;sit to/from stand Pt Will Transfer to Toilet: with contact guard assist Pt Will Perform Tub/Shower Transfer: with contact guard assist;shower seat;ambulating Additional ADL Goal #1: Pt will be able to voice precautions per md orders for LUE and neck  Plan       Co-evaluation                 AM-PAC OT "6 Clicks" Daily Activity     Outcome Measure   Help from another person eating meals?: A Little Help from another person taking care of personal grooming?: A Little Help from another person toileting, which includes using toliet, bedpan, or urinal?: A Little Help from another person bathing (including washing, rinsing, drying)?: A Lot Help from another person to put on and taking off regular upper body clothing?: A Little Help from another person to put on and taking off regular lower body clothing?: A Lot 6 Click Score: 16    End of Session    OT Visit Diagnosis: Unsteadiness on feet (R26.81);Repeated falls (R29.6);Muscle weakness (generalized) (M62.81);History of falling (Z91.81);Pain Pain - Right/Left: Left Pain - part of body: Shoulder  Activity Tolerance Patient tolerated treatment well   Patient Left in chair;with call bell/phone within reach   Nurse Communication Mobility status        Time: 1135-1201 OT Time Calculation (min): 26 min  Charges: OT General Charges $OT Visit: 1 Visit OT Treatments $Self Care/Home Management : 8-22 mins $Therapeutic Activity: 8-22 mins  Lennyn Bellanca, OTR/L   Alexis Goodell 08/29/2023, 12:24 PM

## 2023-08-29 NOTE — Progress Notes (Signed)
Inpatient Rehabilitation Admissions Coordinator   Noted therapy change in recommendations. We will sign off at this time.  Ottie Glazier, RN, MSN Rehab Admissions Coordinator [phone] 08/29/2023 1:10 PM

## 2023-08-29 NOTE — Evaluation (Signed)
Modified Barium Swallow Study  Patient Details  Name: Erik Holmes. MRN: [id number] Date of Birth: 1961-07-18  Today's Date: 08/29/2023  Modified Barium Swallow completed.  Full report located under Chart Review in the Imaging Section.  History of Present Illness Patient is a 62 y.o. male with PMH: anxiety, DDD, DM, HTN, sleep apnea, lubar fusion, RTKA. He was undergoing evaluation at [facility] in 2015 with differential diagnoses of pseudo-dementia of depression, metabolic, post-traumatic, neurodegenerative, mild cognitive impairment. Of note, patient recently had f/u appointment with Dr. Ernestene Kiel at [facility] during which patient reported dysphagia symptoms including increased frequency of choking on food. Modified Barium Swallow study was ordered to be completed at [facility] on an outpatient basis. Patient presented to the ED on 08/26/23 s/p fall from family's deck (20 feet), resulting in multiple injuries including transverse process fractures of the thoracic spine and a C7 transverse process fracture. CT head negative for acute intracranial abnormality.   Clinical Impression MBS was completed with limited trials given pt nausea. He did have a functional oropharyngeal swallow across consistencies tested though. He has occasional penetration with thin liquids due to mistiming, but he has adequate airway protective mechanisms so that penetrates are spontaneously ejected (PAS 2), considered to be normal. No aspiration occurred. Pt did have slightly reduced duration of UES opening with trace amounts of barium remaining not fully clearing or even getting squeezed back into his pyriform sinuses. Most significant was when the barium tablet got stuck above the UES, but a second spontaneous swallow cleared this readily. Question if this could be contributing to his intermittent symptoms but no overt coughing was seen during study today. Would continue with regular solids and thin liquids as tolerated  with aspiration precautions. Encouraged him to reduce distractions during intake as well.  Factors that may increase risk of adverse event in presence of aspiration Rubye Oaks & Clearance Coots 2021):    Swallow Evaluation Recommendations Recommendations: PO diet PO Diet Recommendation: Regular;Thin liquids (Level 0) Liquid Administration via: Cup;Straw Medication Administration: Whole meds with liquid Supervision: Patient able to self-feed Swallowing strategies  : Minimize environmental distractions;Slow rate;Small bites/sips Postural changes: Position pt fully upright for meals;Stay upright 30-60 min after meals Oral care recommendations: Oral care BID (2x/day)      Mahala Menghini., M.A. CCC-SLP Acute Rehabilitation Services Office [phone]  Secure chat preferred  08/29/2023,1:29 PM

## 2023-08-29 NOTE — Inpatient Diabetes Management (Addendum)
Inpatient Diabetes Program Recommendations  AACE/ADA: New Consensus Statement on Inpatient Glycemic Control   Target Ranges:  Prepandial:   less than 140 mg/dL      Peak postprandial:   less than 180 mg/dL (1-2 hours)      Critically ill patients:  140 - 180 mg/dL    Latest Reference Range & Units 08/28/23 10:01 08/28/23 12:44 08/28/23 17:26 08/28/23 20:53 08/29/23 07:07  Glucose-Capillary 70 - 99 mg/dL 409 (H) 811 (H) 914 (H) 175 (H) 223 (H)   Review of Glycemic Control  Diabetes history: DM2 Outpatient Diabetes medications: Metformin XR 1000 mg BID, Actos 15 mg daily Current orders for Inpatient glycemic control: Novolog 0-15 units TID with meals, Novolog 0-5 units QHS  Inpatient Diabetes Program Recommendations:    Insulin: Please consider ordering Semglee 10 units daily and Novolog 3 units TID with meals for meal coverage if patient eats at least 50% of meals.  Outpatient DM: Please provide Rx for glucose monitoring kit (#[id number]).  Addendum 08/29/23@12 :15-Spoke with patient at bedside regarding DM. Patient sitting up in chair. Patient states that he is not sure what DM medications he takes, his wife gives them to him. Patient states that his memory has been declining and he can not remember things well. Patient states that he does not have a glucometer at home to check glucose. Patient states that his last A1C was good as far as he can recall. Discussed current A1C of 10.4% on 08/28/23 and explained that current A1C indicates an average glucose of 252 mg/dl. Patient does not recall being on any steroids in the past 2-3 months. Patient states that he sees his PCP every 3 months. Encouraged patient to talk with PCP about DM control and that it would be requested that a glucose monitoring kit be prescribed so he can start checking glucose at home. Discussed glucose and A1C goals. Stressed importance of improving DM control. Patient verbalized understanding and asked that I call his wife  regarding his DM control. Called patient's wife Javante Nilsson [phone]) to get more information but no answer. Will attempt to try again later today.  Thanks, Orlando Penner, RN, MSN, CDCES Diabetes Coordinator Inpatient Diabetes Program [phone] (Team Pager from 8am to 5pm)

## 2023-08-29 NOTE — TOC Transition Note (Signed)
Transition of Care [facility]) - CM/SW Discharge Note   Patient Details  Name: Erik Holmes. MRN: [id number] Date of Birth: [birth year]  [facility] CM/SW Contact:  Glennon Mac, RN Phone Number: 08/29/2023, 2:30pm  Clinical Narrative:    Pt is a 62 yr old male who presented following a fall of their family's deck. Pt can not recall the fall and what they were doing. Pt was found to have a L clavicle fx with NWB, L rib fxs 1-3 , splaced left C7 transverse process fracture, acute displaced left T1 transverse process fracture now with a hard c collar.  PTA, pt independent and lives at home with spouse, who can provide needed assistance at dc.  PT recommending HH follow up, but patient politely declines.  He states he can do his own rehab at home.  Patient denies any DME needs.   Final next level of care: Home/Self Care Barriers to Discharge: Barriers Resolved                         Discharge Plan and Services Additional resources added to the After Visit Summary for     Discharge Planning Services: CM Consult                      HH Arranged: Refused HH          Social Determinants of Health (SDOH) Interventions SDOH Screenings   Food Insecurity: No Food Insecurity (08/26/2023)  Housing: Low Risk  (08/26/2023)  Transportation Needs: No Transportation Needs (08/26/2023)  Utilities: Not At Risk (08/26/2023)  Tobacco Use: Medium Risk (08/26/2023)     Readmission Risk Interventions     No data to display         Quintella Baton, RN, BSN  Trauma/Neuro ICU Case Manager [phone]

## 2023-08-29 NOTE — Plan of Care (Signed)

## 2023-09-06 ENCOUNTER — Encounter: Payer: Medicare Other | Admitting: Orthopedic Surgery

## 2023-09-06 ENCOUNTER — Ambulatory Visit (INDEPENDENT_AMBULATORY_CARE_PROVIDER_SITE_OTHER): Payer: Medicare Other | Admitting: Orthopedic Surgery

## 2023-09-06 ENCOUNTER — Other Ambulatory Visit (INDEPENDENT_AMBULATORY_CARE_PROVIDER_SITE_OTHER): Payer: Medicare Other

## 2023-09-06 DIAGNOSIS — S42032A Displaced fracture of lateral end of left clavicle, initial encounter for closed fracture: Secondary | ICD-10-CM

## 2023-09-06 MED ORDER — METHOCARBAMOL 500 MG PO TABS: 500.0000 mg | ORAL_TABLET | Freq: Four times a day (QID) | ORAL | 1 refills | Status: AC | PRN

## 2023-09-06 MED ORDER — OXYCODONE HCL 5 MG PO TABS
5.0000 mg | ORAL_TABLET | ORAL | 0 refills | Status: AC | PRN
Start: 2023-09-06 — End: 2023-09-11

## 2023-09-06 NOTE — Progress Notes (Signed)
Orthopedic Surgery Progress Note   Assessment: Patient is a 62 y.o. male with comminuted left distal clavicle fracture after a fall from height   Plan: -No acute operative intervention at this time -Prescribed Robaxin and oxycodone for his pain -Continue nonweightbearing left upper extremity in sling -Will start physical therapy for range of motion of the shoulder at 6-week appointment -Follow-up in the office in 1 week, x-rays at next visit: left clavicle  ___________________________________________________________________________  Subjective: Patient's pain in the left shoulder has been getting better since discharge from the hospital.  He says his biggest pain right now is the rib pain.  He has been nonweightbearing in a sling for the left upper extremity.  Denies paresthesias and numbness in the left upper extremity.   Physical Exam:  General: no acute distress, appears stated age Neurologic: alert, answering questions appropriately, following commands Respiratory: unlabored breathing on room air, symmetric chest rise Psychiatric: appropriate affect, normal cadence to speech  MSK:   -Left upper extremity  No tenderness to palpation over extremity, except the shoulder  No gross deformity, no open wounds Fires deltoid, biceps, triceps, wrist extensors, wrist flexors, finger extensors, finger flexors  AIN/PIN/IO intact  Palpable radial pulse  Sensation intact to light touch in median/ulnar/radial/axillary nerve distributions  Hand warm and well perfused  Imaging: XRs of the left clavicle from 09/06/2023 were independently reviewed and interpreted, showing a comminuted distal clavicle fracture.  There is apex superior angulation to the fracture.  The superior fragment has migrated cranially when compared to films from 08/26/2023.  However, the overall alignment appears similar with just slight increase in the apex superior angulation.   Patient name: Erik Holmes. Patient MRN: [id number] Date: 09/06/23

## 2023-09-14 DIAGNOSIS — Z6829 Body mass index (BMI) 29.0-29.9, adult: Secondary | ICD-10-CM | POA: Diagnosis not present

## 2023-09-14 DIAGNOSIS — S12691A Other nondisplaced fracture of seventh cervical vertebra, initial encounter for closed fracture: Secondary | ICD-10-CM | POA: Diagnosis not present

## 2023-09-21 ENCOUNTER — Ambulatory Visit (INDEPENDENT_AMBULATORY_CARE_PROVIDER_SITE_OTHER): Payer: Medicare Other | Admitting: Orthopedic Surgery

## 2023-09-21 ENCOUNTER — Other Ambulatory Visit (INDEPENDENT_AMBULATORY_CARE_PROVIDER_SITE_OTHER): Payer: Medicare Other

## 2023-09-21 ENCOUNTER — Ambulatory Visit: Payer: Medicare Other | Admitting: Orthopedic Surgery

## 2023-09-21 DIAGNOSIS — S42032A Displaced fracture of lateral end of left clavicle, initial encounter for closed fracture: Secondary | ICD-10-CM

## 2023-09-21 NOTE — Progress Notes (Signed)
Orthopedic Surgery Progress Note     Assessment: Patient is a 62 y.o. male with comminuted left distal clavicle fracture after a fall from height Date of injury: 08/26/2023 (~4 weeks from injury)   Plan: -No acute operative intervention at this time -Pain control: OTC medications  -Continue nonweightbearing left upper extremity, can come out f the sling around the house -Will start physical therapy at our visit -Follow-up in the office in 2 weeks, x-rays at next visit: left clavicle   ___________________________________________________________________________   Subjective: Patient feels his shoulder pain has gotten better since our last visit. Still having significant rib pain. His rib pain is much more significant than his shoulder pain.      Physical Exam:   General: no acute distress, appears stated age Neurologic: alert, answering questions appropriately, following commands Respiratory: unlabored breathing on room air, symmetric chest rise Psychiatric: appropriate affect, normal cadence to speech   MSK:    -Left upper extremity             No tenderness to palpation over the shoulder including over the clavicle, swelling around the distal clavicle has decreased since last visit Fires deltoid, biceps, triceps, wrist extensors, wrist flexors, finger extensors, finger flexors             AIN/PIN/IO intact             Palpable radial pulse             Sensation intact to light touch in median/ulnar/radial/axillary nerve distributions             Hand warm and well perfused   Imaging: XRs of the left clavicle from 09/21/2023 were independently reviewed and interpreted, showing comminuted distal clavicle fracture. Alignment has been maintained since last films on 09/06/2023. No interval callus formation seen. No new fractures seen.      Patient name: Erik Holmes. Patient MRN: [id number] Date: 09/21/23

## 2023-09-26 DIAGNOSIS — E7800 Pure hypercholesterolemia, unspecified: Secondary | ICD-10-CM | POA: Diagnosis not present

## 2023-09-26 DIAGNOSIS — Z125 Encounter for screening for malignant neoplasm of prostate: Secondary | ICD-10-CM | POA: Diagnosis not present

## 2023-09-26 DIAGNOSIS — Z23 Encounter for immunization: Secondary | ICD-10-CM | POA: Diagnosis not present

## 2023-09-26 DIAGNOSIS — I10 Essential (primary) hypertension: Secondary | ICD-10-CM | POA: Diagnosis not present

## 2023-09-26 DIAGNOSIS — Z860100 Personal history of colon polyps, unspecified: Secondary | ICD-10-CM | POA: Diagnosis not present

## 2023-09-26 DIAGNOSIS — Z0000 Encounter for general adult medical examination without abnormal findings: Secondary | ICD-10-CM | POA: Diagnosis not present

## 2023-09-26 DIAGNOSIS — Z711 Person with feared health complaint in whom no diagnosis is made: Secondary | ICD-10-CM | POA: Diagnosis not present

## 2023-09-26 DIAGNOSIS — E1169 Type 2 diabetes mellitus with other specified complication: Secondary | ICD-10-CM | POA: Diagnosis not present

## 2023-10-04 ENCOUNTER — Encounter: Payer: Self-pay | Admitting: Psychiatry

## 2023-10-26 DIAGNOSIS — M25512 Pain in left shoulder: Secondary | ICD-10-CM | POA: Diagnosis not present

## 2023-10-26 DIAGNOSIS — M542 Cervicalgia: Secondary | ICD-10-CM | POA: Diagnosis not present

## 2023-11-08 ENCOUNTER — Other Ambulatory Visit (INDEPENDENT_AMBULATORY_CARE_PROVIDER_SITE_OTHER): Payer: Self-pay

## 2023-11-08 ENCOUNTER — Ambulatory Visit (INDEPENDENT_AMBULATORY_CARE_PROVIDER_SITE_OTHER): Payer: Medicare Other | Admitting: Orthopedic Surgery

## 2023-11-08 DIAGNOSIS — S42032A Displaced fracture of lateral end of left clavicle, initial encounter for closed fracture: Secondary | ICD-10-CM

## 2023-11-08 NOTE — Progress Notes (Signed)
Orthopedic Surgery Progress Note     Assessment: Patient is a 62 y.o. male with comminuted left distal clavicle fracture after a fall from height Date of injury: 08/26/2023 (~2 months from injury)   Plan: -No acute operative intervention at this time -Pain control: OTC medications  -Referred him to physical therapy -Can continue with weightbearing as tolerated in the left shoulder and range of motion as tolerated -Follow-up in the office in 6 weeks, x-rays at next visit: left clavicle   ___________________________________________________________________________   Subjective: After leaving the office last time, patient discontinued his sling.  He also began to weight-bear as tolerated on his left arm on his own accord.  He said he had been doing well until a fall in the last 2 weeks.  At that fall, he noted neck pain that radiated into the bilateral shoulders.  That pain has since resolved.  He comes in today because he wanted to make sure that he did not injure his shoulder further with that fall.  He is is not having any pain in the shoulder except when doing overhead activities.     Physical Exam:   General: no acute distress, appears stated age Neurologic: alert, answering questions appropriately, following commands Respiratory: unlabored breathing on room air, symmetric chest rise Psychiatric: appropriate affect, normal cadence to speech   MSK:    -Left upper extremity             No tenderness palpation over the shoulder including the clavicle  Able to actively forward flex his arm to 110 degrees, no pain with internal and right external rotation with arm at side Fires deltoid, biceps, triceps, wrist extensors, wrist flexors, finger extensors, finger flexors             AIN/PIN/IO intact             Palpable radial pulse             Sensation intact to light touch in median/ulnar/radial/axillary nerve distributions             Hand warm and well perfused   Imaging: XRs of  the left clavicle from 11/08/2023 were independently reviewed and interpreted, showing comminuted distal clavicle fracture.  Alignment appears similar to prior films from 09/21/2023.  No new fracture seen.  No significant callus formation observed on today's films.     Patient name: Erik Holmes. Patient MRN: [id number] Date: 11/08/23

## 2023-11-17 ENCOUNTER — Other Ambulatory Visit: Payer: Self-pay

## 2023-11-17 DIAGNOSIS — F411 Generalized anxiety disorder: Secondary | ICD-10-CM

## 2023-11-17 MED ORDER — DIAZEPAM 5 MG PO TABS
5.0000 mg | ORAL_TABLET | Freq: Two times a day (BID) | ORAL | 5 refills | Status: DC | PRN
Start: 2023-11-17 — End: 2024-01-30

## 2023-12-17 ENCOUNTER — Encounter: Payer: Self-pay | Admitting: Internal Medicine

## 2023-12-20 ENCOUNTER — Other Ambulatory Visit (INDEPENDENT_AMBULATORY_CARE_PROVIDER_SITE_OTHER): Payer: Self-pay

## 2023-12-20 ENCOUNTER — Ambulatory Visit (INDEPENDENT_AMBULATORY_CARE_PROVIDER_SITE_OTHER): Payer: Medicare Other | Admitting: Orthopedic Surgery

## 2023-12-20 DIAGNOSIS — S42032A Displaced fracture of lateral end of left clavicle, initial encounter for closed fracture: Secondary | ICD-10-CM | POA: Diagnosis not present

## 2023-12-20 NOTE — Progress Notes (Signed)
Orthopedic Surgery Progress Note     Assessment: Patient is a 64 y.o. male with comminuted left distal clavicle fracture after a fall from height Date of injury: 08/26/2023 (~3 months from injury)   Plan: -No acute operative intervention at this time -Weight bearing as tolerated, activity as tolerated -No specific restrictions at this point -Pain control: OTC medications -Follow-up in the office on an as needed basis   ___________________________________________________________________________   Subjective: Patient has been doing well since he was last seen. He is not having any pain in his shoulder with everyday activities. He sometimes has pain if he is doing heavy lifting. For example, he said he was lifting 70lb car batteries the other day and had shoulder pain after doing that. He is not taking any medication for pain. Denies paresthesias and numbness.      Physical Exam:   General: no acute distress, appears stated age Neurologic: alert, answering questions appropriately, following commands Respiratory: unlabored breathing on room air, symmetric chest rise Psychiatric: appropriate affect, normal cadence to speech   MSK:    -Left upper extremity             No tenderness palpation over the shoulder including the clavicle             Full ROM at the shoulder Fires deltoid, biceps, triceps, wrist extensors, wrist flexors, finger extensors, finger flexors             AIN/PIN/IO intact             Palpable radial pulse             Sensation intact to light touch in median/ulnar/radial/axillary nerve distributions             Hand warm and well perfused   Imaging: XRs of the left clavicle from 12/20/2023 were independently reviewed and interpreted, showing comminuted distal clavicle fracture.  Alignment appears similar to initial films from 08/2023.  No new fracture seen.  No significant callus formation noted.      Patient name: Erik Holmes. Patient MRN:  [id number] Date: 12/20/23

## 2024-01-02 ENCOUNTER — Ambulatory Visit: Payer: Medicare Other | Admitting: Psychiatry

## 2024-01-30 ENCOUNTER — Encounter: Payer: Self-pay | Admitting: Behavioral Health

## 2024-01-30 ENCOUNTER — Ambulatory Visit (INDEPENDENT_AMBULATORY_CARE_PROVIDER_SITE_OTHER): Payer: Medicare Other | Admitting: Behavioral Health

## 2024-01-30 DIAGNOSIS — F32A Depression, unspecified: Secondary | ICD-10-CM

## 2024-01-30 DIAGNOSIS — F411 Generalized anxiety disorder: Secondary | ICD-10-CM | POA: Diagnosis not present

## 2024-01-30 DIAGNOSIS — Z79899 Other long term (current) drug therapy: Secondary | ICD-10-CM

## 2024-01-30 MED ORDER — BUSPIRONE HCL 15 MG PO TABS: ORAL_TABLET | ORAL | 1 refills | Status: DC

## 2024-01-30 MED ORDER — DIAZEPAM 5 MG PO TABS
5.0000 mg | ORAL_TABLET | Freq: Two times a day (BID) | ORAL | 5 refills | Status: DC | PRN
Start: 2024-01-30 — End: 2024-08-01

## 2024-01-30 MED ORDER — VENLAFAXINE HCL ER 150 MG PO CP24
ORAL_CAPSULE | ORAL | 3 refills | Status: DC
Start: 2024-01-30 — End: 2024-08-01

## 2024-01-30 MED ORDER — LITHIUM CARBONATE 300 MG PO CAPS: ORAL_CAPSULE | ORAL | 1 refills | Status: DC

## 2024-01-30 NOTE — Progress Notes (Signed)
 [facility]  Patient ID: Erik Pipkins.,  MRN: [id number]  PCP: Debroah Loop, DO  Date of Evaluation: 01/30/2024 Time spent:30 minutes  Chief Complaint:  Chief Complaint   Anxiety; Depression; Follow-up; Medication Refill; Patient Education     HISTORY/CURRENT STATUS: HPI  Erik Holmes. presents to the office today for follow-up and medication management. Collateral information should be considered reliable. He is former pt of Corie Chiquito, NP. Says he continues to do very well and "I just need to continue my meds and I am ok". He talks about losing his son to violence a few years ago but is managing better. Has bad days every now and then. Says his depression today is 0/10 and anxiety is 3/10.  Sleeping ok. He reports that his energy is low and would like to have more pep but understands he is getting older. Denies current mania, no psychosis, no auditory or visual hallucinations. Denies SI or HI. Says that he feels safe.   Individual Medical History/ Review of Systems: Changes? :No   Allergies: Penicillins, Codeine, Other, Sildenafil citrate, and Xanax [alprazolam]  Current Medications:  Current Outpatient Medications:    acetaminophen (TYLENOL) 500 MG tablet, Take 2 tablets (1,000 mg total) by mouth every 6 (six) hours as needed., Disp: , Rfl:    Blood Glucose Monitoring Suppl DEVI, 1 each by Does not apply route in the morning, at noon, and at bedtime. May substitute to any manufacturer covered by patient's insurance., Disp: 1 each, Rfl: 0   busPIRone (BUSPAR) 15 MG tablet, 1 bid, Disp: 180 tablet, Rfl: 1   diazepam (VALIUM) 5 MG tablet, Take 1 tablet (5 mg total) by mouth 2 (two) times daily as needed for anxiety., Disp: 60 tablet, Rfl: 5   EPINEPHrine (EPIPEN 2-PAK) 0.3 mg/0.3 mL IJ SOAJ injection, Take 0.3 mg by mouth daily as needed for anaphylaxis., Disp: , Rfl:    guaiFENesin (MUCINEX) 600 MG 12 hr tablet, Take 1 tablet (600 mg total) by mouth  2 (two) times daily as needed for to loosen phlegm., Disp: , Rfl:    lithium carbonate 300 MG capsule, 1qam and 2qhs, Disp: 270 capsule, Rfl: 1   metFORMIN (GLUCOPHAGE-XR) 500 MG 24 hr tablet, Take 1,000 mg by mouth 2 (two) times a day. , Disp: , Rfl:    NON FORMULARY, CPAP AT BEDTIME, Disp: , Rfl:    pioglitazone (ACTOS) 15 MG tablet, Take 15 mg by mouth daily., Disp: , Rfl:    polyethylene glycol (MIRALAX / GLYCOLAX) 17 g packet, Take 17 g by mouth daily as needed for mild constipation., Disp: , Rfl:    rosuvastatin (CRESTOR) 5 MG tablet, Take 5 mg by mouth daily., Disp: , Rfl:    silver sulfADIAZINE (SILVADENE) 1 % cream, Apply topically daily., Disp: , Rfl:    venlafaxine XR (EFFEXOR-XR) 150 MG 24 hr capsule, TAKE 2 CAPSULES BY MOUTH DAILY  WITH BREAKFAST, Disp: 180 capsule, Rfl: 3 Medication Side Effects: none  Family Medical/ Social History: Changes? No  MENTAL HEALTH EXAM:  There were no vitals taken for this visit.There is no height or weight on file to calculate BMI.  General Appearance: Casual and Neat  Eye Contact:  Good  Speech:  Clear and Coherent  Volume:  Normal  Mood:  NA  Affect:  Appropriate  Thought Process:  Coherent  Orientation:  Full (Time, Place, and Person)  Thought Content: Logical   Suicidal Thoughts:  No  Homicidal Thoughts:  No  Memory:  WNL  Judgement:  Good  Insight:  Good  Psychomotor Activity:  Normal  Concentration:  Concentration: Good  Recall:  Good  Fund of Knowledge: Good  Language: Good  Assets:  Desire for Improvement  ADL's:  Intact  Cognition: WNL  Prognosis:  Good    DIAGNOSES:    ICD-10-CM   1. Depression, unspecified depression type  F32.A venlafaxine XR (EFFEXOR-XR) 150 MG 24 hr capsule    lithium carbonate 300 MG capsule    2. Anxiety state  F41.1 busPIRone (BUSPAR) 15 MG tablet    diazepam (VALIUM) 5 MG tablet    venlafaxine XR (EFFEXOR-XR) 150 MG 24 hr capsule    3. High risk medication use  Z79.899 Lithium level       Receiving Psychotherapy: No    RECOMMENDATIONS:   30 minutes spent dedicated to the care of this patient on the date of this encounter to include pre-visit review of records, ordering of medication, post visit documentation, and face-to-face time with the patient discussing his continued good stability. He expresses that his meds are working well and he does not want to change anything. We discussed his care while seeing Corie Chiquito NP.  We agreed to: Ordered new Lithium labs today. Pt agrees to go to [facility]. Requisition form provided.  Will continue current medications without changes.  Continue Buspar 15 mg po BID for anxiety.  Continue Lithium 300 mg in the morning and 600 mg at bedtime for mood stabilization.  Continue Effexor XR 300 mg daily for depression and anxiety.  Continue Diazepam 5 mg po BID prn anxiety.  Pt to follow-up in 6 months or sooner if clinically indicated.  Patient advised to contact office with any questions, adverse effects, or acute worsening in signs and symptoms.    Joan Flores, NP

## 2024-02-01 DIAGNOSIS — Z79899 Other long term (current) drug therapy: Secondary | ICD-10-CM | POA: Diagnosis not present

## 2024-02-02 LAB — LITHIUM LEVEL: Lithium Lvl: 0.5 mmol/L — ABNORMAL LOW (ref 0.6–1.2)

## 2024-03-28 DIAGNOSIS — H35033 Hypertensive retinopathy, bilateral: Secondary | ICD-10-CM | POA: Diagnosis not present

## 2024-03-28 DIAGNOSIS — F17200 Nicotine dependence, unspecified, uncomplicated: Secondary | ICD-10-CM | POA: Diagnosis not present

## 2024-03-28 DIAGNOSIS — Z91038 Other insect allergy status: Secondary | ICD-10-CM | POA: Diagnosis not present

## 2024-03-28 DIAGNOSIS — F411 Generalized anxiety disorder: Secondary | ICD-10-CM | POA: Diagnosis not present

## 2024-03-28 DIAGNOSIS — E118 Type 2 diabetes mellitus with unspecified complications: Secondary | ICD-10-CM | POA: Diagnosis not present

## 2024-03-28 DIAGNOSIS — E782 Mixed hyperlipidemia: Secondary | ICD-10-CM | POA: Diagnosis not present

## 2024-03-28 DIAGNOSIS — K219 Gastro-esophageal reflux disease without esophagitis: Secondary | ICD-10-CM | POA: Diagnosis not present

## 2024-03-28 DIAGNOSIS — I10 Essential (primary) hypertension: Secondary | ICD-10-CM | POA: Diagnosis not present

## 2024-03-28 DIAGNOSIS — R4189 Other symptoms and signs involving cognitive functions and awareness: Secondary | ICD-10-CM | POA: Diagnosis not present

## 2024-03-28 DIAGNOSIS — F319 Bipolar disorder, unspecified: Secondary | ICD-10-CM | POA: Diagnosis not present

## 2024-04-04 DIAGNOSIS — E782 Mixed hyperlipidemia: Secondary | ICD-10-CM | POA: Diagnosis not present

## 2024-04-04 DIAGNOSIS — E118 Type 2 diabetes mellitus with unspecified complications: Secondary | ICD-10-CM | POA: Diagnosis not present

## 2024-04-29 ENCOUNTER — Encounter: Payer: Self-pay | Admitting: Physician Assistant

## 2024-05-13 DIAGNOSIS — H532 Diplopia: Secondary | ICD-10-CM | POA: Diagnosis not present

## 2024-05-13 DIAGNOSIS — F17210 Nicotine dependence, cigarettes, uncomplicated: Secondary | ICD-10-CM | POA: Diagnosis not present

## 2024-05-13 DIAGNOSIS — H04123 Dry eye syndrome of bilateral lacrimal glands: Secondary | ICD-10-CM | POA: Diagnosis not present

## 2024-05-13 DIAGNOSIS — E119 Type 2 diabetes mellitus without complications: Secondary | ICD-10-CM | POA: Diagnosis not present

## 2024-05-13 DIAGNOSIS — R0609 Other forms of dyspnea: Secondary | ICD-10-CM | POA: Diagnosis not present

## 2024-05-13 DIAGNOSIS — G4733 Obstructive sleep apnea (adult) (pediatric): Secondary | ICD-10-CM | POA: Diagnosis not present

## 2024-05-13 DIAGNOSIS — H26493 Other secondary cataract, bilateral: Secondary | ICD-10-CM | POA: Diagnosis not present

## 2024-05-26 NOTE — Progress Notes (Signed)
 Assessment/Plan:   Erik Holmes. is a very pleasant 63 y.o. year old RH male with a history of hypertension, hyperlipidemia, DM2, GERD, anxiety, depression, OSA on CPAP, history of multiple TBI, seen today for evaluation of memory loss. MoCA today is 16/30. Etiology is likely of multiple causes, workup is in progress. Patient is able to participate on ADLs and continues to drive without significant difficulties.  Mood is controlled by [facility].   Memory Impairment of unclear etiology, likely multifactorial  MRI brain without contrast to assess for underlying structural abnormality and assess vascular load  Check B12, TSH EEG to rule out seizure activity. Continue to control mood as per PCP Recommend good control of cardiovascular risk factors Recommend using the CPAP for OSA [place] up in 3 months   Subjective:   The patient is here alone.    How long did patient have memory difficulties? For the last 10 years, it is worse I think I have dementia--he says.  Initially he was seen in September 2015 by [facility] with same concerns, specifically short-term memory loss, forgetting tasks to do, word finding and stuttering without any neurological finding.  Reports some difficulty remembering new information, conversations and names. I get lost in mid sentence, I draw a blank-he says.  Long-term memory is good. repeats oneself?  Endorsed Disoriented when walking into a room?  Patient denies except occasionally not remembering what patient came to the room for  Sometimes I do not know where I am.    Leaving objects in unusual places? Denies.   Wandering behavior?  Denies. Any personality changes? I am spending more time by myself lately.   Any history of depression?:  Endorsed.  My son go killed in his own house 2 years ago and my mom died after him, took a toll on my depression. He also has bipolar disorder he sees psychiatry at [facility].  He is on multiple agents including BuSpar ,  Valium , Effexor , lithium  Hallucinations or paranoia?  Denies.   Seizures?  As a child, between the ages of 1 to 62 years old, and was on phenobarbital and Dilantin at the time, last seizure in life was in November 2015, without trigger, and consistent of generalized convulsions, without LOC, or tongue biting or incontinence.  He had 1 or 2 hours of postictal confusion.  Any sleep changes?   Sleeps well. Denies vivid dreams, REM behavior or sleepwalking.  He has RLS. Sleep apnea?  Endorsed, on CPAP. Any hygiene concerns?  Denies   Independent of bathing and dressing?  Endorsed  Does the patient needs help with medications? Patient is in charge   Who is in charge of the finances? Patient is in charge     Any changes in appetite?  Denies. Trying to lose more weight, lost already 100 lbs on my own   tried Ozempic, unable to tolerate  Patient have trouble swallowing? Denies.   Does the patient cook? No.    Any kitchen accidents such as leaving the stove on? Denies.   Any history of headaches? Tension headaches attributed to stress, no aura, no nausea or vomiting, they are seldom, sometimes takes tylenol .   Chronic pain ?  He is on disability for chronic low back pain, DDD.I quit the oxycodone  several years ago. Ambulates with difficulty?  Denies. Going back to exercise.   Recent falls or head injuries?  He has a history of multiple concussions throughout his life. In October 2024 he sustained a L clavicular fracture  and hitting on the occipital area after falling 25 ft when cutting a branch of a tree.   Vision changes? Denies.   Any stroke like symptoms? Denies.   Any tremors?  Sometimes it comes and I start to shake when nervous.   Any anosmia? I can't smell nothing for years for at least 25 years  Any incontinence of urine?  Endorsed, urge incontinence. Any bowel dysfunction? Denies.      Patient lives with his wife History of heavy alcohol intake? Denies.   History of heavy tobacco  use?  Quit 1 -2 ppd 1 year ago. Family history of dementia? Mother may have had dementia but he is not sure.  Does patient drive? Yes, sometimes I have to pull over to figure where I am     Past Medical History:  Diagnosis Date   Allergy    Anxiety    Cataract    Degenerative disc disease, lumbar    Depression    Diabetes mellitus without complication (HCC)    Diabetes mellitus, type II (HCC)    ED (erectile dysfunction)    GERD (gastroesophageal reflux disease)    Gout    Hypertension    Internal hemorrhoids with prolapse, bleeding 04/24/2014   RLS (restless legs syndrome)    Seizures (HCC)    last seizure around 2016 per patient.   Shortness of breath    Sleep apnea    cpap   Tubular adenoma of colon    multiple, TV adenomas also     Past Surgical History:  Procedure Laterality Date   ANTERIOR LAT LUMBAR FUSION Left 04/30/2013   Procedure: ANTERIOR LATERAL LUMBAR FUSION 1 LEVEL;  Surgeon: Fairy Levels, MD;  Location: [facility];  Service: Neurosurgery;  Laterality: Left;  Lumbar three-four  Anterolateral decompression/fusion/percutaneous pedicle screws    BACK SURGERY     CARDIAC CATHETERIZATION  1998   90's  neg   cataract surgery Bilateral    COLONOSCOPY  multiple   Dr. Burnette Pfeiffer   EYE SURGERY     cataracts bilateral with lens placement   hemorrhoid banding  2015   LUMBAR PERCUTANEOUS PEDICLE SCREW 1 LEVEL N/A 04/30/2013   Procedure: LUMBAR PERCUTANEOUS PEDICLE SCREW 1 LEVEL;  Surgeon: Fairy Levels, MD;  Location: [facility];  Service: Neurosurgery;  Laterality: N/A;  Lumbar three-four  Anterolateral decompression/fusion/percutaneous pedicle screws   SPINE SURGERY  2008   lumbar fusion   TOTAL KNEE ARTHROPLASTY Right 02/03/2017   Procedure: RIGHT TOTAL KNEE ARTHROPLASTY;  Surgeon: Lamar Collet, MD;  Location: [facility];  Service: Orthopedics;  Laterality: Right;     Allergies  Allergen Reactions   Penicillins Anaphylaxis and Other (See Comments)    Has  patient had a PCN reaction causing immediate rash, facial/tongue/throat swelling, SOB or lightheadedness with hypotension:Yes Has patient had a PCN reaction causing severe rash involving mucus membranes or skin necrosis:No Has patient had a PCN reaction that required hospitalization:Treated in ER for reaction Has patient had a PCN reaction occurring within the last 10 years:Yes If all of the above answers are NO, then may proceed with Cephalosporin use.    Codeine Other (See Comments)    Made pt feel like skin was crawling   Other Other (See Comments)    bee stings   Sildenafil Citrate Other (See Comments)   Xanax [Alprazolam]     Made him feel angry    Current Outpatient Medications  Medication Instructions   acetaminophen  (TYLENOL ) 1,000 mg, Oral, Every  6 hours PRN   Blood Glucose Monitoring Suppl DEVI 1 each, Does not apply, 3 times daily, May substitute to any manufacturer covered by patient's insurance.   busPIRone  (BUSPAR ) 15 MG tablet 1 bid   diazepam  (VALIUM ) 5 mg, Oral, 2 times daily PRN   donepezil  (ARICEPT ) 10 MG tablet Take half tablet (5 mg) daily for 2 weeks, then increase to the full tablet at 10 mg daily   EPINEPHrine  (EPIPEN  2-PAK) 0.3 mg, Daily PRN   guaiFENesin  (MUCINEX ) 600 mg, Oral, 2 times daily PRN   lithium  carbonate 300 MG capsule 1qam and 2qhs   metFORMIN (GLUCOPHAGE-XR) 1,000 mg, 2 times daily   NON FORMULARY CPAP AT BEDTIME   OZEMPIC, 0.25 OR 0.5 MG/DOSE, 2 MG/3ML SOPN INJECT 0.25 MG INTO THE SKIN ONCE WEEKLY FOR 1 MONTH THEN INCREASE TO 0.5MG  ONCE WEEKLY   pioglitazone (ACTOS) 15 mg, Daily   polyethylene glycol (MIRALAX  / GLYCOLAX ) 17 g, Oral, Daily PRN   rosuvastatin (CRESTOR) 5 mg, Daily   silver  sulfADIAZINE  (SILVADENE ) 1 % cream Topical, Daily   venlafaxine  XR (EFFEXOR -XR) 150 MG 24 hr capsule TAKE 2 CAPSULES BY MOUTH DAILY  WITH BREAKFAST     VITALS:   Vitals:   05/27/24 0749  BP: 127/84  Pulse: 78  SpO2: 96%  Weight: 200 lb (90.7 kg)   Height: 5' 9 (1.753 m)         05/27/2024    8:00 AM 07/29/2014   10:32 AM  Montreal Cognitive Assessment   Visuospatial/ Executive (0/5) 4 2  Naming (0/3) 3 3  Attention: Read list of digits (0/2) 2 2  Attention: Read list of letters (0/1) 0 1  Attention: Serial 7 subtraction starting at 100 (0/3) 1 1  Language: Repeat phrase (0/2) 0 0  Language : Fluency (0/1) 0 1  Abstraction (0/2) 0 1  Delayed Recall (0/5) 0 2  Orientation (0/6) 5 5  Total 15 18  Adjusted Score (based on education) 16 19       07/29/2014   10:12 AM  MMSE - Mini Mental State Exam  Orientation to time 4   Orientation to Place 5   Registration 3   Attention/ Calculation 5   Recall 2   Language- name 2 objects 2   Language- repeat 1  Language- follow 3 step command 3   Language- read & follow direction 1   Write a sentence 1   Copy design 0   Total score 27      Data saved with a previous flowsheet row definition     PHYSICAL EXAM   HEENT:  Normocephalic, multiple scars on the head from multiple falls. The superficial temporal arteries are without ropiness or tenderness. Cardiovascular: Regular rate and rhythm. Lungs: Clear to auscultation bilaterally. Neck: There are no carotid bruits noted bilaterally.  Orientation:  Alert and oriented to person, place and time. No aphasia or dysarthria. Fund of knowledge is appropriate. Recent and remote memory impaired.  Attention and concentration are reduced. Able to name objects and unable to repeat phrases. Delayed recall  0/5 Cranial nerves: There is good facial symmetry. Extraocular muscles are intact and visual fields are full to confrontational testing. Speech is fluent and clear. No tongue deviation. Hearing is intact to conversational tone. Tone: Tone is good throughout. Abnormal movements: No tremors. No Asterixis. No Fasciculations Sensation: Sensation is intact to light touch. Vibration is intact at the bilateral big toe.  Coordination: The patient  has no difficulty with RAM's  or FNF bilaterally. Normal finger to nose  Motor: Strength is 5/5 in the bilateral upper and lower extremities. There is no pronator drift. There are no fasciculations noted. DTR's: Deep tendon reflexes are 2/4 bilaterally. Gait and Station: The patient is able to ambulate without difficulty The patient is able to heel toe walk. Gait is cautious and narrow. The patient is able to ambulate in a tandem fashion.       Thank you for allowing us  the opportunity to participate in the care of this nice patient. Please do not hesitate to contact us  for any questions or concerns.   Total time spent on today's visit was 63 minutes dedicated to this patient today, preparing to see patient, examining the patient, ordering tests and/or medications and counseling the patient, documenting clinical information in the EHR or other health record, independently interpreting results and communicating results to the patient/family, discussing treatment and goals, answering patient's questions and coordinating care.  Cc:  Auston Opal, DO  [facility] 05/27/2024 11:55 AM

## 2024-05-27 ENCOUNTER — Other Ambulatory Visit

## 2024-05-27 ENCOUNTER — Encounter

## 2024-05-27 ENCOUNTER — Ambulatory Visit (INDEPENDENT_AMBULATORY_CARE_PROVIDER_SITE_OTHER): Payer: Self-pay | Admitting: Physician Assistant

## 2024-05-27 VITALS — BP 127/84 | HR 78 | Ht 69.0 in | Wt 200.0 lb

## 2024-05-27 DIAGNOSIS — R413 Other amnesia: Secondary | ICD-10-CM | POA: Diagnosis not present

## 2024-05-27 DIAGNOSIS — R404 Transient alteration of awareness: Secondary | ICD-10-CM | POA: Diagnosis not present

## 2024-05-27 LAB — TSH: TSH: 2.27 m[IU]/L (ref 0.40–4.50)

## 2024-05-27 LAB — VITAMIN B12: Vitamin B-12: 333 pg/mL (ref 200–1100)

## 2024-05-27 MED ORDER — DONEPEZIL HCL 10 MG PO TABS
ORAL_TABLET | ORAL | 3 refills | Status: AC
Start: 2024-05-27 — End: ?

## 2024-05-27 NOTE — Patient Instructions (Addendum)
 It was a pleasure to see you today at our office.   Recommendations:   MRI of the brain, the radiology office will call you to arrange you appointment   EEG We will start donepezil  half tablet (5mg ) daily for 2  weeks.  If you are tolerating the medication, then after 2 weeks, we will increase the dose to a full tablet of 10 mg daily.  Side effects include nausea, vomiting, diarrhea, vivid dreams, and muscle cramps.  Check labs today   Use the CPAP Follow up in 3 months    https://www.[employer]row[employer].org/resource/neuro-rehabilitation-apps-and-games/   RECOMMENDATIONS FOR ALL PATIENTS WITH MEMORY PROBLEMS: 1. Continue to exercise (Recommend 30 minutes of walking everyday, or 3 hours every week) 2. Increase social interactions - continue going to [place] and enjoy social gatherings with friends and family 3. Eat healthy, avoid fried foods and eat more fruits and vegetables 4. Maintain adequate blood pressure, blood sugar, and blood cholesterol level. Reducing the risk of stroke and cardiovascular disease also helps promoting better memory. 5. Avoid stressful situations. Live a simple life and avoid aggravations. Organize your time and prepare for the next day in anticipation. 6. Sleep well, avoid any interruptions of sleep and avoid any distractions in the bedroom that may interfere with adequate sleep quality 7. Avoid sugar, avoid sweets as there is a strong link between excessive sugar intake, diabetes, and cognitive impairment We discussed the Mediterranean diet, which has been shown to help patients reduce the risk of progressive memory disorders and reduces cardiovascular risk. This includes eating fish, eat fruits and green leafy vegetables, nuts like almonds and hazelnuts, walnuts, and also use olive oil. Avoid fast foods and fried foods as much as possible. Avoid sweets and sugar as sugar use has been linked to worsening of memory function.  There is always a concern of gradual progression  of memory problems. If this is the case, then we may need to adjust level of care according to patient needs. Support, both to the patient and caregiver, should then be put into place.       DRIVING: Regarding driving, in patients with progressive memory problems, driving will be impaired. We advise to have someone else do the driving if trouble finding directions or if minor accidents are reported. Independent driving assessment is available to determine safety of driving.   If you are interested in the driving assessment, you can contact the following:  [employer] in [place] [phone]  [facility] [phone]  [facility] [phone]  [facility] [phone] or [phone]   FALL PRECAUTIONS: Be cautious when walking. Scan the area for obstacles that may increase the risk of trips and falls. When getting up in the mornings, sit up at the edge of the bed for a few minutes before getting out of bed. Consider elevating the bed at the head end to avoid drop of blood pressure when getting up. Walk always in a well-lit room (use night lights in the walls). Avoid area rugs or power cords from appliances in the middle of the walkways. Use a walker or a cane if necessary and consider physical therapy for balance exercise. Get your eyesight checked regularly.  FINANCIAL OVERSIGHT: Supervision, especially oversight when making financial decisions or transactions is also recommended.  HOME SAFETY: Consider the safety of the kitchen when operating appliances like stoves, microwave oven, and blender. Consider having supervision and share cooking responsibilities until no longer able to participate in those. Accidents with firearms and other  hazards in the house should be identified and addressed as well.   ABILITY TO BE LEFT ALONE: If patient is unable to contact 911 operator, consider using LifeLine, or when the need is there, arrange for  someone to stay with patients. Smoking is a fire hazard, consider supervision or cessation. Risk of wandering should be assessed by caregiver and if detected at any point, supervision and safe proof recommendations should be instituted.  MEDICATION SUPERVISION: Inability to self-administer medication needs to be constantly addressed. Implement a mechanism to ensure safe administration of the medications.      Mediterranean Diet A Mediterranean diet refers to food and lifestyle choices that are based on the traditions of countries located on the [employer]. This way of eating has been shown to help prevent certain conditions and improve outcomes for people who have chronic diseases, like kidney disease and heart disease. What are tips for following this plan? Lifestyle  Cook and eat meals together with your family, when possible. Drink enough fluid to keep your urine clear or pale yellow. Be physically active every day. This includes: Aerobic exercise like running or swimming. Leisure activities like gardening, walking, or housework. Get 7-8 hours of sleep each night. If recommended by your health care provider, drink red wine in moderation. This means 1 glass a day for nonpregnant women and 2 glasses a day for men. A glass of wine equals 5 oz (150 mL). Reading food labels  Check the serving size of packaged foods. For foods such as rice and pasta, the serving size refers to the amount of cooked product, not dry. Check the total fat in packaged foods. Avoid foods that have saturated fat or trans fats. Check the ingredients list for added sugars, such as corn syrup. Shopping  At the grocery store, buy most of your food from the areas near the walls of the store. This includes: Fresh fruits and vegetables (produce). Grains, beans, nuts, and seeds. Some of these may be available in unpackaged forms or large amounts (in bulk). Fresh seafood. Poultry and eggs. Low-fat dairy  products. Buy whole ingredients instead of prepackaged foods. Buy fresh fruits and vegetables in-season from local farmers markets. Buy frozen fruits and vegetables in resealable bags. If you do not have access to quality fresh seafood, buy precooked frozen shrimp or canned fish, such as tuna, salmon, or sardines. Buy small amounts of raw or cooked vegetables, salads, or olives from the deli or salad bar at your store. Stock your pantry so you always have certain foods on hand, such as olive oil, canned tuna, canned tomatoes, rice, pasta, and beans. Cooking  Cook foods with extra-virgin olive oil instead of using butter or other vegetable oils. Have meat as a side dish, and have vegetables or grains as your main dish. This means having meat in small portions or adding small amounts of meat to foods like pasta or stew. Use beans or vegetables instead of meat in common dishes like chili or lasagna. Experiment with different cooking methods. Try roasting or broiling vegetables instead of steaming or sauteing them. Add frozen vegetables to soups, stews, pasta, or rice. Add nuts or seeds for added healthy fat at each meal. You can add these to yogurt, salads, or vegetable dishes. Marinate fish or vegetables using olive oil, lemon juice, garlic, and fresh herbs. Meal planning  Plan to eat 1 vegetarian meal one day each week. Try to work up to 2 vegetarian meals, if possible. Eat seafood 2 or more  times a week. Have healthy snacks readily available, such as: Vegetable sticks with hummus. Greek yogurt. Fruit and nut trail mix. Eat balanced meals throughout the week. This includes: Fruit: 2-3 servings a day Vegetables: 4-5 servings a day Low-fat dairy: 2 servings a day Fish, poultry, or lean meat: 1 serving a day Beans and legumes: 2 or more servings a week Nuts and seeds: 1-2 servings a day Whole grains: 6-8 servings a day Extra-virgin olive oil: 3-4 servings a day Limit red meat and sweets  to only a few servings a month What are my food choices? Mediterranean diet Recommended Grains: Whole-grain pasta. Brown rice. Bulgar wheat. Polenta. Couscous. Whole-wheat bread. [place] Madeira. Vegetables: Artichokes. Beets. Broccoli. Cabbage. Carrots. Eggplant. Green beans. Chard. Kale. Spinach. Onions. Leeks. Peas. Squash. Tomatoes. Peppers. Radishes. Fruits: Apples. Apricots. Avocado. Berries. Bananas. Cherries. Dates. Figs. Grapes. Lemons. Melon. Oranges. Peaches. Plums. Pomegranate. Meats and other protein foods: Beans. Almonds. Sunflower seeds. Pine nuts. Peanuts. Cod. Salmon. Scallops. Shrimp. Tuna. Tilapia. Clams. Oysters. Eggs. Dairy: Low-fat milk. Cheese. Greek yogurt. Beverages: Water. Red wine. Herbal tea. Fats and oils: Extra virgin olive oil. Avocado oil. Grape seed oil. Sweets and desserts: [place] yogurt with honey. Baked apples. Poached pears. Trail mix. Seasoning and other foods: Basil. Cilantro. Coriander. Cumin. Mint. Parsley. Sage. Rosemary. Tarragon. Garlic. Oregano. Thyme. Pepper. Balsalmic vinegar. Tahini. Hummus. Tomato sauce. Olives. Mushrooms. Limit these Grains: Prepackaged pasta or rice dishes. Prepackaged cereal with added sugar. Vegetables: Deep fried potatoes (french fries). Fruits: Fruit canned in syrup. Meats and other protein foods: Beef. Pork. Lamb. Poultry with skin. Hot dogs. [place]. Dairy: Ice cream. Sour cream. Whole milk. Beverages: Juice. Sugar-sweetened soft drinks. Beer. Liquor and spirits. Fats and oils: Butter. Canola oil. Vegetable oil. Beef fat (tallow). Lard. Sweets and desserts: Cookies. Cakes. Pies. Candy. Seasoning and other foods: Mayonnaise. Premade sauces and marinades. The items listed may not be a complete list. Talk with your dietitian about what dietary choices are right for you. Summary The Mediterranean diet includes both food and lifestyle choices. Eat a variety of fresh fruits and vegetables, beans, nuts, seeds, and whole  grains. Limit the amount of red meat and sweets that you eat. Talk with your health care provider about whether it is safe for you to drink red wine in moderation. This means 1 glass a day for nonpregnant women and 2 glasses a day for men. A glass of wine equals 5 oz (150 mL). This information is not intended to replace advice given to you by your health care provider. Make sure you discuss any questions you have with your health care provider. Document Released: 06/30/2016 Document Revised: 08/02/2016 Document Reviewed: 06/30/2016 Elsevier Interactive Patient Education  2017 [employer].

## 2024-05-28 ENCOUNTER — Ambulatory Visit: Payer: Self-pay | Admitting: Physician Assistant

## 2024-05-30 NOTE — Progress Notes (Signed)
 I left message to call office at 9:03am 05/30/2024 regarding labs

## 2024-05-31 NOTE — Progress Notes (Signed)
 No answer at 11:41am 05/31/2024

## 2024-05-31 NOTE — Progress Notes (Signed)
/  Left message again to call office at 4:15pm 05/31/2024

## 2024-06-03 ENCOUNTER — Ambulatory Visit (INDEPENDENT_AMBULATORY_CARE_PROVIDER_SITE_OTHER): Admitting: Neurology

## 2024-06-03 DIAGNOSIS — R404 Transient alteration of awareness: Secondary | ICD-10-CM

## 2024-06-03 NOTE — Progress Notes (Unsigned)
 EEG complete and ready for review.

## 2024-06-03 NOTE — Progress Notes (Signed)
 Letter mailed to contact office, multiple attempts made.

## 2024-06-05 ENCOUNTER — Inpatient Hospital Stay: Admission: RE | Admit: 2024-06-05 | Source: Ambulatory Visit

## 2024-06-05 NOTE — Procedures (Signed)
 ELECTROENCEPHALOGRAM REPORT  Date of Study: 06/03/2024  Patient's Name: Erik Holmes. MRN: [id number] Date of Birth: 24-Jan-1961  Referring Provider: Camie Sevin, PA-C  Clinical History: This is a 63 year old man with history of seizures with memory loss. EEG for classification.  Medications: Buspar , Venlafaxine , Lithium , Donepezil , Valium   Technical Summary: A multichannel digital EEG recording measured by the international 10-20 system with electrodes applied with paste and impedances below 5000 ohms performed in our laboratory with EKG monitoring in an awake patient.  Hyperventilation was not performed. Photic stimulation was performed.  The digital EEG was referentially recorded, reformatted, and digitally filtered in a variety of bipolar and referential montages for optimal display.    Description: The patient is awake during the recording.  During maximal wakefulness, there is a symmetric, medium voltage 9 Hz posterior dominant rhythm that attenuates with eye opening.  The record is symmetric.  Sleep was not captured. Photic stimulation did not elicit any abnormalities.  There were no epileptiform discharges or electrographic seizures seen.    EKG lead was unremarkable.  Impression: This awake EEG is normal.    Clinical Correlation: A normal EEG does not exclude a clinical diagnosis of epilepsy.  If further clinical questions remain, prolonged EEG may be helpful.  Clinical correlation is advised.   Darice Shivers, M.D.

## 2024-06-07 ENCOUNTER — Telehealth: Payer: Self-pay

## 2024-06-07 NOTE — Telephone Encounter (Signed)
 Mri brain w wo contrast cancelled at [facility]. [phone].[phone]. Patient was cancelled 06/05/2024

## 2024-07-05 ENCOUNTER — Other Ambulatory Visit: Payer: Self-pay | Admitting: Family Medicine

## 2024-07-05 DIAGNOSIS — E538 Deficiency of other specified B group vitamins: Secondary | ICD-10-CM | POA: Diagnosis not present

## 2024-07-05 DIAGNOSIS — E782 Mixed hyperlipidemia: Secondary | ICD-10-CM | POA: Diagnosis not present

## 2024-07-05 DIAGNOSIS — E118 Type 2 diabetes mellitus with unspecified complications: Secondary | ICD-10-CM | POA: Diagnosis not present

## 2024-07-05 DIAGNOSIS — R2232 Localized swelling, mass and lump, left upper limb: Secondary | ICD-10-CM

## 2024-07-05 DIAGNOSIS — Z1331 Encounter for screening for depression: Secondary | ICD-10-CM | POA: Diagnosis not present

## 2024-07-05 DIAGNOSIS — Z0000 Encounter for general adult medical examination without abnormal findings: Secondary | ICD-10-CM | POA: Diagnosis not present

## 2024-07-10 ENCOUNTER — Ambulatory Visit
Admission: RE | Admit: 2024-07-10 | Discharge: 2024-07-10 | Disposition: A | Discharge status: Home / Self Care | Source: Ambulatory Visit | Attending: Family Medicine | Admitting: Family Medicine

## 2024-07-10 DIAGNOSIS — R2232 Localized swelling, mass and lump, left upper limb: Secondary | ICD-10-CM

## 2024-07-22 DIAGNOSIS — R2232 Localized swelling, mass and lump, left upper limb: Secondary | ICD-10-CM | POA: Diagnosis not present

## 2024-07-25 ENCOUNTER — Encounter

## 2024-07-25 ENCOUNTER — Encounter: Payer: Self-pay | Admitting: Physician Assistant

## 2024-08-01 ENCOUNTER — Ambulatory Visit: Admitting: Behavioral Health

## 2024-08-01 ENCOUNTER — Encounter: Payer: Self-pay | Admitting: Behavioral Health

## 2024-08-01 DIAGNOSIS — F411 Generalized anxiety disorder: Secondary | ICD-10-CM

## 2024-08-01 DIAGNOSIS — F32A Depression, unspecified: Secondary | ICD-10-CM

## 2024-08-01 MED ORDER — LITHIUM CARBONATE 300 MG PO CAPS: ORAL_CAPSULE | ORAL | 1 refills | Status: AC

## 2024-08-01 MED ORDER — BUSPIRONE HCL 15 MG PO TABS: ORAL_TABLET | ORAL | 1 refills | Status: AC

## 2024-08-01 MED ORDER — VENLAFAXINE HCL ER 150 MG PO CP24: ORAL_CAPSULE | ORAL | 3 refills | Status: AC

## 2024-08-01 MED ORDER — DIAZEPAM 5 MG PO TABS: 5.0000 mg | ORAL_TABLET | Freq: Two times a day (BID) | ORAL | 5 refills | Status: DC | PRN

## 2024-08-01 NOTE — Progress Notes (Signed)
 [facility]  Patient ID: Erik Holmes.,  MRN: [id number]  PCP: Auston Opal, DO  Date of Evaluation: 08/01/2024 Time spent:30 minutes  Chief Complaint:  Chief Complaint   Depression; Anxiety; Follow-up; Medication Refill; Patient Education     HISTORY/CURRENT STATUS: HPI Erik Holmes. presents to the office today for follow-up and medication management. Collateral information should be considered reliable.  Says he continues to do very well. Says, my medications are still working good and I don't want to change them. He is requesting concealed permit for handgun be renewed. Reports he has had permit for 15 years.  Says his depression today is 0/10 and anxiety is 3/10.  Sleeping ok. He reports that his energy is low and would like to have more pep but understands he is getting older. Report low B1 deficiency,  and now taking supplements. Denies current mania, no psychosis, no auditory or visual hallucinations. Denies SI or HI. Says that he feels safe.       Individual Medical History/ Review of Systems: Changes? :No   Allergies: Penicillins, Codeine, Other, Sildenafil citrate, and Xanax [alprazolam]  Current Medications:  Current Outpatient Medications:    acetaminophen  (TYLENOL ) 500 MG tablet, Take 2 tablets (1,000 mg total) by mouth every 6 (six) hours as needed., Disp: , Rfl:    Blood Glucose Monitoring Suppl DEVI, 1 each by Does not apply route in the morning, at noon, and at bedtime. May substitute to any manufacturer covered by patient's insurance., Disp: 1 each, Rfl: 0   busPIRone  (BUSPAR ) 15 MG tablet, 1 bid, Disp: 180 tablet, Rfl: 1   diazepam  (VALIUM ) 5 MG tablet, Take 1 tablet (5 mg total) by mouth 2 (two) times daily as needed for anxiety., Disp: 60 tablet, Rfl: 5   donepezil  (ARICEPT ) 10 MG tablet, Take half tablet (5 mg) daily for 2 weeks, then increase to the full tablet at 10 mg daily, Disp: 90 tablet, Rfl: 3   EPINEPHrine  (EPIPEN   2-PAK) 0.3 mg/0.3 mL IJ SOAJ injection, Take 0.3 mg by mouth daily as needed for anaphylaxis., Disp: , Rfl:    guaiFENesin  (MUCINEX ) 600 MG 12 hr tablet, Take 1 tablet (600 mg total) by mouth 2 (two) times daily as needed for to loosen phlegm., Disp: , Rfl:    lithium  carbonate 300 MG capsule, 1qam and 2qhs, Disp: 270 capsule, Rfl: 1   metFORMIN (GLUCOPHAGE-XR) 500 MG 24 hr tablet, Take 1,000 mg by mouth 2 (two) times a day. , Disp: , Rfl:    NON FORMULARY, CPAP AT BEDTIME, Disp: , Rfl:    OZEMPIC, 0.25 OR 0.5 MG/DOSE, 2 MG/3ML SOPN, INJECT 0.25 MG INTO THE SKIN ONCE WEEKLY FOR 1 MONTH THEN INCREASE TO 0.5MG  ONCE WEEKLY, Disp: , Rfl:    pioglitazone (ACTOS) 15 MG tablet, Take 15 mg by mouth daily., Disp: , Rfl:    polyethylene glycol (MIRALAX  / GLYCOLAX ) 17 g packet, Take 17 g by mouth daily as needed for mild constipation., Disp: , Rfl:    rosuvastatin (CRESTOR) 5 MG tablet, Take 5 mg by mouth daily., Disp: , Rfl:    silver  sulfADIAZINE  (SILVADENE ) 1 % cream, Apply topically daily., Disp: , Rfl:    venlafaxine  XR (EFFEXOR -XR) 150 MG 24 hr capsule, TAKE 2 CAPSULES BY MOUTH DAILY  WITH BREAKFAST, Disp: 180 capsule, Rfl: 3 Medication Side Effects: none  Family Medical/ Social History: Changes? No  MENTAL HEALTH EXAM:  There were no vitals taken for this visit.There is no height or  weight on file to calculate BMI.  General Appearance: Casual, Neat, and Well Groomed  Eye Contact:  Good  Speech:  Clear and Coherent  Volume:  Normal  Mood:  NA  Affect:  Appropriate  Thought Process:  Coherent  Orientation:  Full (Time, Place, and Person)  Thought Content: Logical   Suicidal Thoughts:  No  Homicidal Thoughts:  No  Memory:  WNL  Judgement:  Good  Insight:  Good  Psychomotor Activity:  Normal  Concentration:  Concentration: Good  Recall:  Good  Fund of Knowledge: Good  Language: Good  Assets:  Desire for Improvement  ADL's:  Intact  Cognition: WNL  Prognosis:  Good    DIAGNOSES:     ICD-10-CM   1. Anxiety state  F41.1 busPIRone  (BUSPAR ) 15 MG tablet    diazepam  (VALIUM ) 5 MG tablet    venlafaxine  XR (EFFEXOR -XR) 150 MG 24 hr capsule    2. Depression, unspecified depression type  F32.A venlafaxine  XR (EFFEXOR -XR) 150 MG 24 hr capsule    lithium  carbonate 300 MG capsule      Receiving Psychotherapy: No    RECOMMENDATIONS:   30 minutes spent dedicated to the care of this patient on the date of this encounter to include pre-visit review of records, ordering of medication, post visit documentation, and face-to-face time with the patient discussing his continued good stability. He expresses that his meds are working well and he does not want to change anything. Requesting documentation to renew his concealed carry permit for [facility].   We agreed to: Ordered new Lithium  labs today. Pt [facility]. Requisition form provided.  Will continue current medications without changes.  Continue Buspar  15 mg po BID for anxiety.  Continue Lithium  300 mg in the morning and 600 mg at bedtime for mood stabilization.  Continue Effexor  XR 300 mg daily for depression and anxiety.  Continue Diazepam  5 mg po BID prn anxiety.  Pt to follow-up in 6 months or sooner if clinically indicated.  Patient advised to contact office with any questions, adverse effects, or acute worsening in signs and symptoms.    Redell DELENA Pizza, NP

## 2024-08-21 DIAGNOSIS — G2581 Restless legs syndrome: Secondary | ICD-10-CM | POA: Diagnosis not present

## 2024-08-21 DIAGNOSIS — F319 Bipolar disorder, unspecified: Secondary | ICD-10-CM | POA: Diagnosis not present

## 2024-08-21 DIAGNOSIS — G4733 Obstructive sleep apnea (adult) (pediatric): Secondary | ICD-10-CM | POA: Diagnosis not present

## 2024-08-21 DIAGNOSIS — I10 Essential (primary) hypertension: Secondary | ICD-10-CM | POA: Diagnosis not present

## 2024-09-06 NOTE — Progress Notes (Incomplete)
 Assessment/Plan:     Memory difficulties of unclear etiology , likely multifactorial  Erik Holmes. is a very pleasant 63 y.o. RH male with a history ofhypertension, hyperlipidemia, DM2, GERD, anxiety, depression, OSA on CPAP, history of multiple TBI  presenting today in follow-up for evaluation of memory loss. MRI brain is yet to be performed. ***.     Recommendations:   Follow up in   months. Recommend good control of cardiovascular risk factors Continue to control mood as per PCP Continue CPAP nightly for OSA Replenish B12 (333) Recommend proceeding with MRI of the brain to  evaluate structural abnormalities and vascular load Tobacco cessation counseled    Subjective:   This patient is accompanied in the office by ***  who supplements the history. Previous records as well as any outside records available were reviewed prior to todays visit.   Patient was last seen on 05/2024 with [facility] 16/30 ***.    Any changes in memory since last visit? . repeats oneself?  Endorsed Disoriented when walking into a room?  Patient denies ***  Misplacing objects?  Patient denies   Wandering behavior?   Denies. Any personality changes since last visit? Denies.   Any worsening depression?: denies.   Hallucinations or paranoia?  Denies.   Seizures?   Denies.    Any sleep changes? Sleeps well***. Does not sleep very well***.   Denies vivid dreams, REM behavior or sleepwalking   Sleep apnea?   denies ***  Any hygiene concerns?   Denies.   Independent of bathing and dressing?  Endorsed  Does the patient needs help with medications? Patient is in charge *** Who is in charge of the finances?  Patient is in charge   *** Any changes in appetite?  denies ***   Patient have trouble swallowing?  Denies.   Does the patient cook?  Any kitchen accidents such as leaving the stove on?   Denies.   Any headaches?    Denies.   Vision changes? Denies. Chronic pain?  Denies.   Ambulates with  difficulty?    Denies. ***  Recent falls or head injuries?    Denies.      Unilateral weakness, numbness or tingling?  Denies.   Any tremors?  Denies.   Any anosmia?    Denies.   Any incontinence of urine?  Denies.   Any bowel dysfunction?  Denies.      Patient lives .*** Does the patient drive?***  Initial visit 05/2024 How long did patient have memory difficulties? For the last 10 years, it is worse I think I have dementia--he says.  Initially he was seen in September 2015 by [facility] with same concerns, specifically short-term memory loss, forgetting tasks to do, word finding and stuttering without any neurological finding.  Reports some difficulty remembering new information, conversations and names. I get lost in mid sentence, I draw a blank-he says.  Long-term memory is good. repeats oneself?  Endorsed Disoriented when walking into a room?  Patient denies except occasionally not remembering what patient came to the room for  Sometimes I do not know where I am.    Leaving objects in unusual places? Denies.   Wandering behavior?  Denies. Any personality changes? I am spending more time by myself lately.   Any history of depression?:  Endorsed.  My son go killed in his own house 2 years ago and my mom died after him, took a toll on my depression. He also has bipolar disorder  he sees psychiatry at [facility].  He is on multiple agents including BuSpar , Valium , Effexor , lithium  Hallucinations or paranoia?  Denies.   Seizures?  As a child, between the ages of 23 to 8 years old, and was on phenobarbital and Dilantin at the time, last seizure in life was in November 2015, without trigger, and consistent of generalized convulsions, without LOC, or tongue biting or incontinence.  He had 1 or 2 hours of postictal confusion.  Any sleep changes?   Sleeps well. Denies vivid dreams, REM behavior or sleepwalking.  He has RLS. Sleep apnea?  Endorsed, on CPAP. Any hygiene concerns?  Denies    Independent of bathing and dressing?  Endorsed  Does the patient needs help with medications? Patient is in charge   Who is in charge of the finances? Patient is in charge     Any changes in appetite?  Denies. Trying to lose more weight, lost already 100 lbs on my own   tried Ozempic, unable to tolerate  Patient have trouble swallowing? Denies.   Does the patient cook? No.    Any kitchen accidents such as leaving the stove on? Denies.   Any history of headaches? Tension headaches attributed to stress, no aura, no nausea or vomiting, they are seldom, sometimes takes tylenol .   Chronic pain ?  He is on disability for chronic low back pain, DDD.I quit the oxycodone  several years ago. Ambulates with difficulty?  Denies. Going back to exercise.   Recent falls or head injuries?  He has a history of multiple concussions throughout his life. In October 2024 he sustained a L clavicular fracture and hitting on the occipital area after falling 25 ft when cutting a branch of a tree.   Vision changes? Denies.   Any stroke like symptoms? Denies.   Any tremors?  Sometimes it comes and I start to shake when nervous.   Any anosmia? I can't smell nothing for years for at least 25 years  Any incontinence of urine?  Endorsed, urge incontinence. Any bowel dysfunction? Denies.      Patient lives with his wife History of heavy alcohol intake? Denies.   History of heavy tobacco use?  Quit 1 -2 ppd 1 year ago. Family history of dementia? Mother may have had dementia but he is not sure.  Does patient drive? Yes, sometimes I have to pull over to figure where I am    EEG is normal, no seizure activity noted  MRI brain personally reviewed   Past Medical History:  Diagnosis Date   Allergy    Anxiety    Cataract    Degenerative disc disease, lumbar    Depression    Diabetes mellitus without complication (HCC)    Diabetes mellitus, type II (HCC)    ED (erectile dysfunction)    GERD (gastroesophageal  reflux disease)    Gout    Hypertension    Internal hemorrhoids with prolapse, bleeding 04/24/2014   RLS (restless legs syndrome)    Seizures (HCC)    last seizure around 2016 per patient.   Shortness of breath    Sleep apnea    cpap   Tubular adenoma of colon    multiple, TV adenomas also     Past Surgical History:  Procedure Laterality Date   ANTERIOR LAT LUMBAR FUSION Left 04/30/2013   Procedure: ANTERIOR LATERAL LUMBAR FUSION 1 LEVEL;  Surgeon: Fairy Levels, MD;  Location: [facility];  Service: Neurosurgery;  Laterality: Left;  Lumbar three-four  Anterolateral  decompression/fusion/percutaneous pedicle screws    BACK SURGERY     CARDIAC CATHETERIZATION  1998   90's  neg   cataract surgery Bilateral    COLONOSCOPY  multiple   Dr. Burnette Pfeiffer   EYE SURGERY     cataracts bilateral with lens placement   hemorrhoid banding  2015   LUMBAR PERCUTANEOUS PEDICLE SCREW 1 LEVEL N/A 04/30/2013   Procedure: LUMBAR PERCUTANEOUS PEDICLE SCREW 1 LEVEL;  Surgeon: Fairy Levels, MD;  Location: [facility];  Service: Neurosurgery;  Laterality: N/A;  Lumbar three-four  Anterolateral decompression/fusion/percutaneous pedicle screws   SPINE SURGERY  2008   lumbar fusion   TOTAL KNEE ARTHROPLASTY Right 02/03/2017   Procedure: RIGHT TOTAL KNEE ARTHROPLASTY;  Surgeon: Lamar Collet, MD;  Location: [facility];  Service: Orthopedics;  Laterality: Right;     PREVIOUS MEDICATIONS:   CURRENT MEDICATIONS:  Outpatient Encounter Medications as of 09/09/2024  Medication Sig   acetaminophen  (TYLENOL ) 500 MG tablet Take 2 tablets (1,000 mg total) by mouth every 6 (six) hours as needed.   Blood Glucose Monitoring Suppl DEVI 1 each by Does not apply route in the morning, at noon, and at bedtime. May substitute to any manufacturer covered by patient's insurance.   busPIRone  (BUSPAR ) 15 MG tablet 1 bid   diazepam  (VALIUM ) 5 MG tablet Take 1 tablet (5 mg total) by mouth 2 (two) times daily as needed for anxiety.    donepezil  (ARICEPT ) 10 MG tablet Take half tablet (5 mg) daily for 2 weeks, then increase to the full tablet at 10 mg daily   EPINEPHrine  (EPIPEN  2-PAK) 0.3 mg/0.3 mL IJ SOAJ injection Take 0.3 mg by mouth daily as needed for anaphylaxis.   guaiFENesin  (MUCINEX ) 600 MG 12 hr tablet Take 1 tablet (600 mg total) by mouth 2 (two) times daily as needed for to loosen phlegm.   lithium  carbonate 300 MG capsule 1qam and 2qhs   metFORMIN (GLUCOPHAGE-XR) 500 MG 24 hr tablet Take 1,000 mg by mouth 2 (two) times a day.    NON FORMULARY CPAP AT BEDTIME   OZEMPIC, 0.25 OR 0.5 MG/DOSE, 2 MG/3ML SOPN INJECT 0.25 MG INTO THE SKIN ONCE WEEKLY FOR 1 MONTH THEN INCREASE TO 0.5MG  ONCE WEEKLY   pioglitazone (ACTOS) 15 MG tablet Take 15 mg by mouth daily.   polyethylene glycol (MIRALAX  / GLYCOLAX ) 17 g packet Take 17 g by mouth daily as needed for mild constipation.   rosuvastatin (CRESTOR) 5 MG tablet Take 5 mg by mouth daily.   silver  sulfADIAZINE  (SILVADENE ) 1 % cream Apply topically daily.   venlafaxine  XR (EFFEXOR -XR) 150 MG 24 hr capsule TAKE 2 CAPSULES BY MOUTH DAILY  WITH BREAKFAST   No facility-administered encounter medications on file as of 09/09/2024.     Objective:     PHYSICAL EXAMINATION:    VITALS:  There were no vitals filed for this visit.  GEN:  The patient appears stated age and is in NAD. HEENT:  Normocephalic, atraumatic.   Neurological examination:  General: NAD, well-groomed, appears stated age. Orientation: The patient is alert. Oriented to person, place and not to date.*** Cranial nerves: There is good facial symmetry.The speech is fluent and clear. No aphasia or dysarthria. Fund of knowledge is appropriate. Recent memory impaired and remote memory is normal.  Attention and concentration are normal.  Able to name objects and repeat phrases.  Hearing is intact to conversational tone ***.   Delayed recall *** Sensation: Sensation is intact to light touch throughout Motor:  Strength  is at least antigravity x4. DTR's 2/4 in UE/LE      05/27/2024    8:00 AM 07/29/2014   10:32 AM  Montreal Cognitive Assessment   Visuospatial/ Executive (0/5) 4 2  Naming (0/3) 3 3  Attention: Read list of digits (0/2) 2 2  Attention: Read list of letters (0/1) 0 1  Attention: Serial 7 subtraction starting at 100 (0/3) 1 1  Language: Repeat phrase (0/2) 0 0  Language : Fluency (0/1) 0 1  Abstraction (0/2) 0 1  Delayed Recall (0/5) 0 2  Orientation (0/6) 5 5  Total 15 18  Adjusted Score (based on education) 16 19       07/29/2014   10:12 AM  MMSE - Mini Mental State Exam  Orientation to time 4   Orientation to Place 5   Registration 3   Attention/ Calculation 5   Recall 2   Language- name 2 objects 2   Language- repeat 1  Language- follow 3 step command 3   Language- read & follow direction 1   Write a sentence 1   Copy design 0   Total score 27      Data saved with a previous flowsheet row definition       Movement examination: Tone: There is normal tone in the UE/LE Abnormal movements:  no tremor.  No myoclonus.  No asterixis.   Coordination:  There is no decremation with RAM's. Normal finger to nose  Gait and Station: The patient has no difficulty arising out of a deep-seated chair without the use of the hands. The patient's stride length is good.  Gait is cautious and narrow.   Thank you for allowing us  the opportunity to participate in the care of this nice patient. Please do not hesitate to contact us  for any questions or concerns.   Total time spent on today's visit was *** minutes dedicated to this patient today, preparing to see patient, examining the patient, ordering tests and/or medications and counseling the patient, documenting clinical information in the EHR or other health record, independently interpreting results and communicating results to the patient/family, discussing treatment and goals, answering patient's questions and coordinating  care.  Cc:  Auston Opal, ROSALEA [facility] 09/06/2024 6:47 PM

## 2024-09-09 ENCOUNTER — Ambulatory Visit: Admitting: Physician Assistant

## 2024-09-09 ENCOUNTER — Encounter: Payer: Self-pay | Admitting: Physician Assistant

## 2024-09-17 NOTE — Telephone Encounter (Signed)
 Courage has called in regarding results. He stated that he has not heard back regarding results. He also stated that he missed the MRI scan and would need to also get that rescheduled.   PH: [phone]

## 2024-09-30 ENCOUNTER — Ambulatory Visit
Admission: RE | Admit: 2024-09-30 | Discharge: 2024-09-30 | Disposition: A | Source: Ambulatory Visit | Attending: Physician Assistant | Admitting: Physician Assistant

## 2024-09-30 DIAGNOSIS — R413 Other amnesia: Secondary | ICD-10-CM

## 2024-09-30 MED ORDER — GADOPICLENOL 0.5 MMOL/ML IV SOLN
9.0000 mL | Freq: Once | INTRAVENOUS | Status: AC | PRN
  Administered 2024-09-30: 9 mL via INTRAVENOUS

## 2024-10-04 NOTE — Progress Notes (Signed)
 Voicemail will call back 10/04/2024 at 3:04pm

## 2024-10-07 DIAGNOSIS — R4189 Other symptoms and signs involving cognitive functions and awareness: Secondary | ICD-10-CM | POA: Diagnosis not present

## 2024-10-07 DIAGNOSIS — E538 Deficiency of other specified B group vitamins: Secondary | ICD-10-CM | POA: Diagnosis not present

## 2024-10-07 DIAGNOSIS — E118 Type 2 diabetes mellitus with unspecified complications: Secondary | ICD-10-CM | POA: Diagnosis not present

## 2024-10-07 DIAGNOSIS — Z125 Encounter for screening for malignant neoplasm of prostate: Secondary | ICD-10-CM | POA: Diagnosis not present

## 2024-10-07 DIAGNOSIS — I10 Essential (primary) hypertension: Secondary | ICD-10-CM | POA: Diagnosis not present

## 2024-10-07 DIAGNOSIS — K219 Gastro-esophageal reflux disease without esophagitis: Secondary | ICD-10-CM | POA: Diagnosis not present

## 2024-10-07 DIAGNOSIS — Z1331 Encounter for screening for depression: Secondary | ICD-10-CM | POA: Diagnosis not present

## 2024-10-07 DIAGNOSIS — Z23 Encounter for immunization: Secondary | ICD-10-CM | POA: Diagnosis not present

## 2024-10-07 DIAGNOSIS — E782 Mixed hyperlipidemia: Secondary | ICD-10-CM | POA: Diagnosis not present

## 2024-10-07 DIAGNOSIS — F319 Bipolar disorder, unspecified: Secondary | ICD-10-CM | POA: Diagnosis not present

## 2024-10-07 DIAGNOSIS — Z0000 Encounter for general adult medical examination without abnormal findings: Secondary | ICD-10-CM | POA: Diagnosis not present

## 2024-10-07 DIAGNOSIS — F411 Generalized anxiety disorder: Secondary | ICD-10-CM | POA: Diagnosis not present

## 2024-10-07 NOTE — Progress Notes (Signed)
 Left voicemail to call the office at 2:33pm 10/07/2024

## 2024-10-09 ENCOUNTER — Telehealth: Payer: Self-pay

## 2024-10-09 NOTE — Telephone Encounter (Signed)
 Patient called in for MRI, voiced understanding of results and thanked me for calling home. Patient will follow up as scheduled in Feb 2026.

## 2024-11-18 ENCOUNTER — Telehealth: Payer: Self-pay | Admitting: Behavioral Health

## 2024-11-18 NOTE — Telephone Encounter (Signed)
 Pt lvm saying he wants meds changed to [facility] so he can pick them up. Did not specify which [facility] or which meds.

## 2024-11-18 NOTE — Telephone Encounter (Signed)
 Erik Holmes called and said that he wants his valium  to go to the [facility] on [address]. He is no longer going to use optium for this drug

## 2024-11-18 NOTE — Telephone Encounter (Signed)
 Canceled RF at [facility]. LF 12/4, due 1/1.

## 2024-11-20 ENCOUNTER — Other Ambulatory Visit: Payer: Self-pay

## 2024-11-20 DIAGNOSIS — F411 Generalized anxiety disorder: Secondary | ICD-10-CM

## 2024-11-20 MED ORDER — DIAZEPAM 5 MG PO TABS: 5.0000 mg | ORAL_TABLET | Freq: Two times a day (BID) | ORAL | 2 refills | Status: AC | PRN

## 2024-11-20 NOTE — Telephone Encounter (Signed)
"  Pended   "

## 2024-12-24 NOTE — Progress Notes (Signed)
 "   Memory impairment of unclear etiology, likely multifactorial   Erik Holmes. is a very pleasant 64 y.o. RH male with a history of f hypertension, hyperlipidemia, DM2, GERD, anxiety, depression, bipolar disorder OSA on CPAP, B12 deficiency, history of multiple TBI, seen today in follow up for memory difficulties. Patient was last seen on 05/27/2024. Memory is stable, MMSE today 28/30.  Patient is able to participate on ADLs and continues to drive without difficulties. Mood is controlled with behavioral health at [facility]. This patient is here alone.  Previous records as well as any outside records available were reviewed prior to todays visit.   Follow up in 6 months Continue donepezil  10 mg daily, side effects discussed Continue using CPAP for OSA Continue to replenish B12 Recommend good control of cardiovascular risk factors.   Continue to control mood as per behavioral health.  He is on BuSpar , Valium , Effexor    Discussed the use of AI scribe software for clinical note transcription with the patient, who gave verbal consent to proceed.  History of Present Illness    Since his last visit in July 2025, memory has remained stable. He continues to have short-term memory deficits, including difficulty recalling recent events and locations, such as needing to call for the office address on the day of the visit although the memory pill helps . He occasionally loses his train of thought mid-sentence, though this is less frequent than previously. Long-term memory is preserved. He repeats questions to family, but this has not worsened. He recognizes his home and neighborhood, does not misplace items in unusual places, and does not require assistance with showering or locating household items. He is able to select his clothing, though sometimes needs help matching items. His wife manages his medications and sets them out daily due to regimen complexity; he does not forget to take them when  prepared. No hallucinations or delusions are present.  He experiences persistent tinnitus. He has not had a recent hearing evaluation and finds hearing aids impractical due to his work environment.  He continues psychiatric care and remains on Buspirone , Diazepam , Venlafaxine  XR, and lithium  without changes. Mood is stable and no psychiatric symptoms are reported.  Sleep is adequate with nightly CPAP use. He does not experience nightmares or excessive nocturnal movements but wakes frequently to urinate. Appetite is normal and weight is stable at approximately 190 pounds. He previously lost significant weight and was unable to tolerate Ozempic.  Chronic low back and neck pain after left clavicle fracture and head injury from a 25-foot fall in 2024. No recent falls have occurred. Headaches are infrequent, stress-related, and lack aura, nausea, or vomiting. He uses acetaminophen  for headache relief. No seizures have occurred since the last visit.  He has urgent urinary incontinence with occasional leakage. No issues with diarrhea or constipation.   He denies alcohol use and has been abstinent for 25 years. He no longer smokes. He lives with his wife and pets.  He is able to drive but sometimes needs to pull over to reorient himself. 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  No vision problems, stroke symptoms, or major tremors are reported, except if there is a stressful situation.      Initial visit 05/27/2024 How long did patient have memory difficulties? For the last 10 years, it is worse I think I have dementia--he says.  Initially he was seen in September 2015 by [facility] with same concerns, specifically short-term memory loss, forgetting tasks to do,  word finding and stuttering without any neurological finding.  Reports some difficulty remembering new information, conversations and names. I get lost in mid sentence, I draw a blank-he says.  Long-term memory is good. repeats  oneself?  Endorsed Disoriented when walking into a room?  Patient denies except occasionally not remembering what patient came to the room for  Sometimes I do not know where I am.    Leaving objects in unusual places? Denies.   Wandering behavior?  Denies. Any personality changes? I am spending more time by myself lately.   Any history of depression?:  Endorsed.  My son go killed in his own house 2 years ago and my mom died after him, took a toll on my depression. He also has bipolar disorder he sees psychiatry at [facility].  He is on multiple agents including BuSpar , Valium , Effexor , lithium  Hallucinations or paranoia?  Denies.   Seizures?  As a child, between the ages of 60 to 62 years old, and was on phenobarbital and Dilantin at the time, last seizure in life was in November 2015, without trigger, and consistent of generalized convulsions, without LOC, or tongue biting or incontinence.  He had 1 or 2 hours of postictal confusion.  Any sleep changes?   Sleeps well. Denies vivid dreams, REM behavior or sleepwalking.  He has RLS. Sleep apnea?  Endorsed, on CPAP. Any hygiene concerns?  Denies   Independent of bathing and dressing?  Endorsed  Does the patient needs help with medications? Patient is in charge   Who is in charge of the finances? Patient is in charge     Any changes in appetite?  Denies. Trying to lose more weight, lost already 100 lbs on my own   tried Ozempic, unable to tolerate  Patient have trouble swallowing? Denies.   Does the patient cook? No.    Any kitchen accidents such as leaving the stove on? Denies.   Any history of headaches? Tension headaches attributed to stress, no aura, no nausea or vomiting, they are seldom, sometimes takes tylenol .   Chronic pain ?  He is on disability for chronic low back pain, DDD.I quit the oxycodone  several years ago. Ambulates with difficulty?  Denies. Going back to exercise.   Recent falls or head injuries?  He has a history  of multiple concussions throughout his life. In October 2024 he sustained a L clavicular fracture and hitting on the occipital area after falling 25 ft when cutting a branch of a tree.   Vision changes? Denies.   Any stroke like symptoms? Denies.   Any tremors?  Sometimes it comes and I start to shake when nervous.   Any anosmia? I can't smell nothing for years for at least 25 years  Any incontinence of urine?  Endorsed, urge incontinence. Any bowel dysfunction? Denies.      Patient lives with his wife History of heavy alcohol intake? Denies.   History of heavy tobacco use?  Quit 1 -2 ppd 1 year ago. Family history of dementia? Mother may have had dementia but he is not sure.  Does patient drive? Yes, sometimes I have to pull over to figure where I am     EEG 06/03/2024, negative for seizures.  MRI of the brain 09/30/2024, personally reviewed, without acute intracranial abnormalities or reversible causes of memory loss, remote right basal ganglia lacunar infarct      12/25/2024   12:00 PM 07/29/2014   10:12 AM  MMSE - Mini Mental State Exam  Orientation  to time 5 4   Orientation to Place 5 5   Registration 3 3   Attention/ Calculation 4 5   Recall 2 2   Language- name 2 objects 2 2   Language- repeat 1 1  Language- follow 3 step command 3 3   Language- read & follow direction 1 1   Write a sentence 1 1   Copy design 1 0   Total score 28 27      Data saved with a previous flowsheet row definition      05/27/2024    8:00 AM 07/29/2014   10:32 AM  Montreal Cognitive Assessment   Visuospatial/ Executive (0/5) 4 2  Naming (0/3) 3 3  Attention: Read list of digits (0/2) 2 2  Attention: Read list of letters (0/1) 0 1  Attention: Serial 7 subtraction starting at 100 (0/3) 1 1  Language: Repeat phrase (0/2) 0 0  Language : Fluency (0/1) 0 1  Abstraction (0/2) 0 1  Delayed Recall (0/5) 0 2  Orientation (0/6) 5 5  Total 15 18  Adjusted Score (based on education) 16 19       Objective:    Neurological Exam:    VITALS:   Vitals:   12/25/24 1124  BP: 131/82  Pulse: 83  Resp: 20  SpO2: 96%  Weight: 207 lb (93.9 kg)  Height: 5' 9 (1.753 m)    GEN:  The patient appears stated age and is in NAD. HEENT:  Normocephalic, atraumatic.   Neurological examination:  General: NAD, well-groomed, appears stated age. HENT: Normocephalic, multiple scars on the head for multiple falls Orientation: The patient is alert. Oriented to person, place and to date Cranial nerves: There is good facial symmetry.The speech is fluent and clear. No aphasia or dysarthria. Fund of knowledge is appropriate. Recent and remote memory are impaired. Attention and concentration are reduced. Able to name objects and unable to phrases.  Hearing is intact to conversational tone.   Sensation: Sensation is intact to light touch throughout Motor: Strength is at least antigravity x4. DTR's 2/4 in UE/LE     Movement examination:  Tone: There is normal tone in the UE/LE Abnormal movements:  no tremor.  No myoclonus.  No asterixis.   Coordination:  There is no decremation with RAM's. Normal finger to nose  Gait and Station: The patient has no difficulty arising out of a deep-seated chair without the use of the hands. The patient's stride length is good.  Gait is cautious and narrow.    Thank you for allowing us  the opportunity to participate in the care of this nice patient. Please do not hesitate to contact us  for any questions or concerns.   Total time spent on today's visit was 25 minutes dedicated to this patient today, preparing to see patient, examining the patient, ordering tests and/or medications and counseling the patient, documenting clinical information in the EHR or other health record, independently interpreting results and communicating results to the patient/family, discussing treatment and goals, answering patient's questions and coordinating care.  Cc:  Auston Milo ROSALEA [facility] 12/25/2024 12:34 PM      "

## 2024-12-25 ENCOUNTER — Encounter: Payer: Self-pay | Admitting: Physician Assistant

## 2024-12-25 ENCOUNTER — Ambulatory Visit (INDEPENDENT_AMBULATORY_CARE_PROVIDER_SITE_OTHER): Admitting: Physician Assistant

## 2024-12-25 VITALS — BP 131/82 | HR 83 | Resp 20 | Ht 69.0 in | Wt 207.0 lb

## 2024-12-25 DIAGNOSIS — R413 Other amnesia: Secondary | ICD-10-CM | POA: Diagnosis not present

## 2024-12-25 NOTE — Patient Instructions (Addendum)
 It was a pleasure to see you today at our office.   Recommendations:  Continue  donepezil  10 mg daily  Use the CPAP Follow up in 6 months Continue following up with behavioral health for mood.  Take your baby aspiring   [email or web address]-[email or web address]   RECOMMENDATIONS FOR ALL PATIENTS WITH MEMORY PROBLEMS: 1. Continue to exercise (Recommend 30 minutes of walking everyday, or 3 hours every week) 2. Increase social interactions - continue going to [place] and enjoy social gatherings with friends and family 3. Eat healthy, avoid fried foods and eat more fruits and vegetables 4. Maintain adequate blood pressure, blood sugar, and blood cholesterol level. Reducing the risk of stroke and cardiovascular disease also helps promoting better memory. 5. Avoid stressful situations. Live a simple life and avoid aggravations. Organize your time and prepare for the next day in anticipation. 6. Sleep well, avoid any interruptions of sleep and avoid any distractions in the bedroom that may interfere with adequate sleep quality 7. Avoid sugar, avoid sweets as there is a strong link between excessive sugar intake, diabetes, and cognitive impairment We discussed the Mediterranean diet, which has been shown to help patients reduce the risk of progressive memory disorders and reduces cardiovascular risk. This includes eating fish, eat fruits and green leafy vegetables, nuts like almonds and hazelnuts, walnuts, and also use olive oil. Avoid fast foods and fried foods as much as possible. Avoid sweets and sugar as sugar use has been linked to worsening of memory function.  There is always a concern of gradual progression of memory problems. If this is the case, then we may need to adjust level of care according to patient needs. Support, both to the patient and caregiver, should then be put into place.       DRIVING: Regarding driving, in patients with progressive memory  problems, driving will be impaired. We advise to have someone else do the driving if trouble finding directions or if minor accidents are reported. Independent driving assessment is available to determine safety of driving.   If you are interested in the driving assessment, you can contact the following:  [facility] [employer] in [place] [phone]  [facility] [phone]  [facility] [phone]  [facility] [phone] or [phone]   FALL PRECAUTIONS: Be cautious when walking. Scan the area for obstacles that may increase the risk of trips and falls. When getting up in the mornings, sit up at the edge of the bed for a few minutes before getting out of bed. Consider elevating the bed at the head end to avoid drop of blood pressure when getting up. Walk always in a well-lit room (use night lights in the walls). Avoid area rugs or power cords from appliances in the middle of the walkways. Use a walker or a cane if necessary and consider physical therapy for balance exercise. Get your eyesight checked regularly.  FINANCIAL OVERSIGHT: Supervision, especially oversight when making financial decisions or transactions is also recommended.  HOME SAFETY: Consider the safety of the kitchen when operating appliances like stoves, microwave oven, and blender. Consider having supervision and share cooking responsibilities until no longer able to participate in those. Accidents with firearms and other hazards in the house should be identified and addressed as well.   ABILITY TO BE LEFT ALONE: If patient is unable to contact 911 operator, consider using LifeLine, or when the need is there, arrange for someone to stay with patients. Smoking is a fire hazard, consider supervision or cessation. Risk of  wandering should be assessed by caregiver and if detected at any point, supervision and safe proof recommendations should be instituted.  MEDICATION SUPERVISION:  Inability to self-administer medication needs to be constantly addressed. Implement a mechanism to ensure safe administration of the medications.      Mediterranean Diet A Mediterranean diet refers to food and lifestyle choices that are based on the traditions of countries located on the [employer]. This way of eating has been shown to help prevent certain conditions and improve outcomes for people who have chronic diseases, like kidney disease and heart disease. What are tips for following this plan? Lifestyle  Cook and eat meals together with your family, when possible. Drink enough fluid to keep your urine clear or pale yellow. Be physically active every day. This includes: Aerobic exercise like running or swimming. Leisure activities like gardening, walking, or housework. Get 7-8 hours of sleep each night. If recommended by your health care provider, drink red wine in moderation. This means 1 glass a day for nonpregnant women and 2 glasses a day for men. A glass of wine equals 5 oz (150 mL). Reading food labels  Check the serving size of packaged foods. For foods such as rice and pasta, the serving size refers to the amount of cooked product, not dry. Check the total fat in packaged foods. Avoid foods that have saturated fat or trans fats. Check the ingredients list for added sugars, such as corn syrup. Shopping  At the grocery store, buy most of your food from the areas near the walls of the store. This includes: Fresh fruits and vegetables (produce). Grains, beans, nuts, and seeds. Some of these may be available in unpackaged forms or large amounts (in bulk). Fresh seafood. Poultry and eggs. Low-fat dairy products. Buy whole ingredients instead of prepackaged foods. Buy fresh fruits and vegetables in-season from local farmers markets. Buy frozen fruits and vegetables in resealable bags. If you do not have access to quality fresh seafood, buy precooked frozen shrimp or  canned fish, such as tuna, salmon, or sardines. Buy small amounts of raw or cooked vegetables, salads, or olives from the deli or salad bar at your store. Stock your pantry so you always have certain foods on hand, such as olive oil, canned tuna, canned tomatoes, rice, pasta, and beans. Cooking  Cook foods with extra-virgin olive oil instead of using butter or other vegetable oils. Have meat as a side dish, and have vegetables or grains as your main dish. This means having meat in small portions or adding small amounts of meat to foods like pasta or stew. Use beans or vegetables instead of meat in common dishes like chili or lasagna. Experiment with different cooking methods. Try roasting or broiling vegetables instead of steaming or sauteing them. Add frozen vegetables to soups, stews, pasta, or rice. Add nuts or seeds for added healthy fat at each meal. You can add these to yogurt, salads, or vegetable dishes. Marinate fish or vegetables using olive oil, lemon juice, garlic, and fresh herbs. Meal planning  Plan to eat 1 vegetarian meal one day each week. Try to work up to 2 vegetarian meals, if possible. Eat seafood 2 or more times a week. Have healthy snacks readily available, such as: Vegetable sticks with hummus. Greek yogurt. Fruit and nut trail mix. Eat balanced meals throughout the week. This includes: Fruit: 2-3 servings a day Vegetables: 4-5 servings a day Low-fat dairy: 2 servings a day Fish, poultry, or lean meat: 1 serving a  day Beans and legumes: 2 or more servings a week Nuts and seeds: 1-2 servings a day Whole grains: 6-8 servings a day Extra-virgin olive oil: 3-4 servings a day Limit red meat and sweets to only a few servings a month What are my food choices? Mediterranean diet Recommended Grains: Whole-grain pasta. Brown rice. Bulgar wheat. Polenta. Couscous. Whole-wheat bread. [place] Made[place]. Vegetables: Artichokes. Beets. Broccoli. Cabbage. Carrots. Eggplant.  Green beans. Chard. Kale. Spinach. Onions. Leeks. Peas. Squash. Tomatoes. Peppers. Radishes. Fruits: Apples. Apricots. Avocado. Berries. Bananas. Cherries. Dates. Figs. Grapes. Lemons. Melon. Oranges. Peaches. Plums. Pomegranate. Meats and other protein foods: Beans. Almonds. Sunflower seeds. Pine nuts. Peanuts. Cod. Salmon. Scallops. Shrimp. Tuna. Tilapia. Clams. Oysters. Eggs. Dairy: Low-fat milk. Cheese. Greek yogurt. Beverages: Water. Red wine. Herbal tea. Fats and oils: Extra virgin olive oil. Avocado oil. Grape seed oil. Sweets and desserts: Greek yogurt with honey. Baked apples. Poached pears. Trail mix. Seasoning and other foods: Basil. Cilantro. Coriander. Cumin. Mint. Parsley. Sage. Rosemary. Tarragon. Garlic. Oregano. Thyme. Pepper. Balsalmic vinegar. Tahini. Hummus. Tomato sauce. Olives. Mushrooms. Limit these Grains: Prepackaged pasta or rice dishes. Prepackaged cereal with added sugar. Vegetables: Deep fried potatoes (french fries). Fruits: Fruit canned in syrup. Meats and other protein foods: Beef. Pork. Lamb. Poultry with skin. Hot dogs. [place]. Dairy: Ice cream. Sour cream. Whole milk. Beverages: Juice. Sugar-sweetened soft drinks. Beer. Liquor and spirits. Fats and oils: Butter. Canola oil. Vegetable oil. Beef fat (tallow). Lard. Sweets and desserts: Cookies. Cakes. Pies. Candy. Seasoning and other foods: Mayonnaise. Premade sauces and marinades. The items listed may not be a complete list. Talk with your dietitian about what dietary choices are right for you. Summary The Mediterranean diet includes both food and lifestyle choices. Eat a variety of fresh fruits and vegetables, beans, nuts, seeds, and whole grains. Limit the amount of red meat and sweets that you eat. Talk with your health care provider about whether it is safe for you to drink red wine in moderation. This means 1 glass a day for nonpregnant women and 2 glasses a day for men. A glass of wine equals 5 oz (150  mL). This information is not intended to replace advice given to you by your health care provider. Make sure you discuss any questions you have with your health care provider. Document Released: 06/30/2016 Document Revised: 08/02/2016 Document Reviewed: 06/30/2016 Elsevier Interactive Patient Education  2017 [employer].

## 2025-01-29 ENCOUNTER — Ambulatory Visit: Admitting: Behavioral Health

## 2025-06-24 ENCOUNTER — Ambulatory Visit: Payer: Self-pay | Admitting: Physician Assistant
